# Patient Record
Sex: Male | Born: 1955 | Race: Black or African American | Hispanic: No | Marital: Married | Smoking: Heavy tobacco smoker
Health system: Southern US, Community
[De-identification: ages and names within clinical notes are randomized; demographics above are authoritative.]

## PROBLEM LIST (undated history)

## (undated) DIAGNOSIS — I81 Portal vein thrombosis: Secondary | ICD-10-CM

## (undated) DIAGNOSIS — K8689 Other specified diseases of pancreas: Secondary | ICD-10-CM

## (undated) HISTORY — PX: ABDOMINAL SURGERY: SHX537

## (undated) HISTORY — DX: Portal vein thrombosis: I81

## (undated) HISTORY — DX: Other specified diseases of pancreas: K86.89

---

## 2004-03-28 ENCOUNTER — Emergency Department: Payer: Self-pay | Admitting: General Practice

## 2004-12-18 ENCOUNTER — Ambulatory Visit: Payer: Self-pay

## 2005-12-15 ENCOUNTER — Ambulatory Visit: Payer: Self-pay | Admitting: General Practice

## 2007-10-05 ENCOUNTER — Emergency Department: Payer: Self-pay | Admitting: Emergency Medicine

## 2008-05-09 ENCOUNTER — Emergency Department: Payer: Self-pay | Admitting: Emergency Medicine

## 2008-09-13 ENCOUNTER — Emergency Department: Payer: Self-pay | Admitting: Emergency Medicine
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
## 2009-05-02 ENCOUNTER — Emergency Department: Payer: Self-pay | Admitting: Emergency Medicine
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
## 2010-09-16 ENCOUNTER — Emergency Department: Payer: Self-pay | Admitting: Emergency Medicine

## 2012-02-18 IMAGING — CR DG ANKLE COMPLETE 3+V*L*
1 series · 5 of 5 positions shown · non-contrast
Comparison: none

REASON FOR EXAM: pain
COMMENTS:   May transport without cardiac monitor

PROCEDURE:     DXR - DXR ANKLE LEFT COMPLETE  - September 16, 2010  [DATE]
RESULT:     Images of the left ankle demonstrate soft tissue swelling
especially laterally without a definite fracture in the tibia. No fibular
fracture is evident.

[Series 1: view not recorded · 0.17mm/px · 5 of 5 slices shown]
[im 1/5]
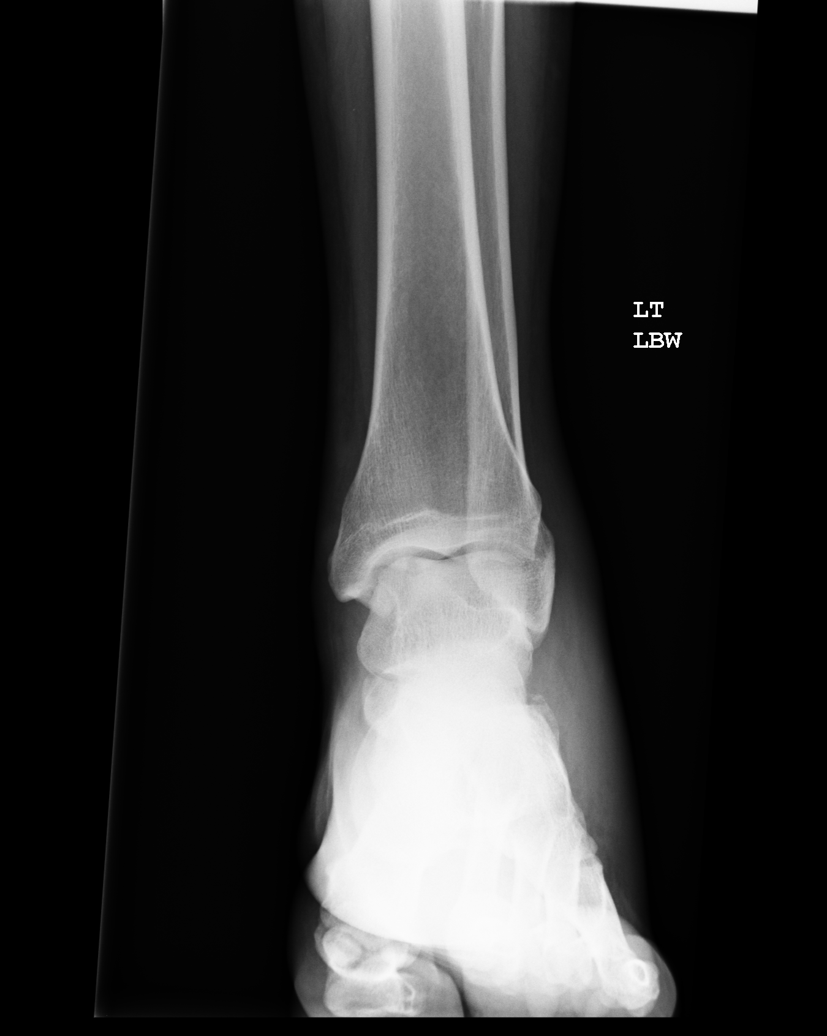
[im 2/5]
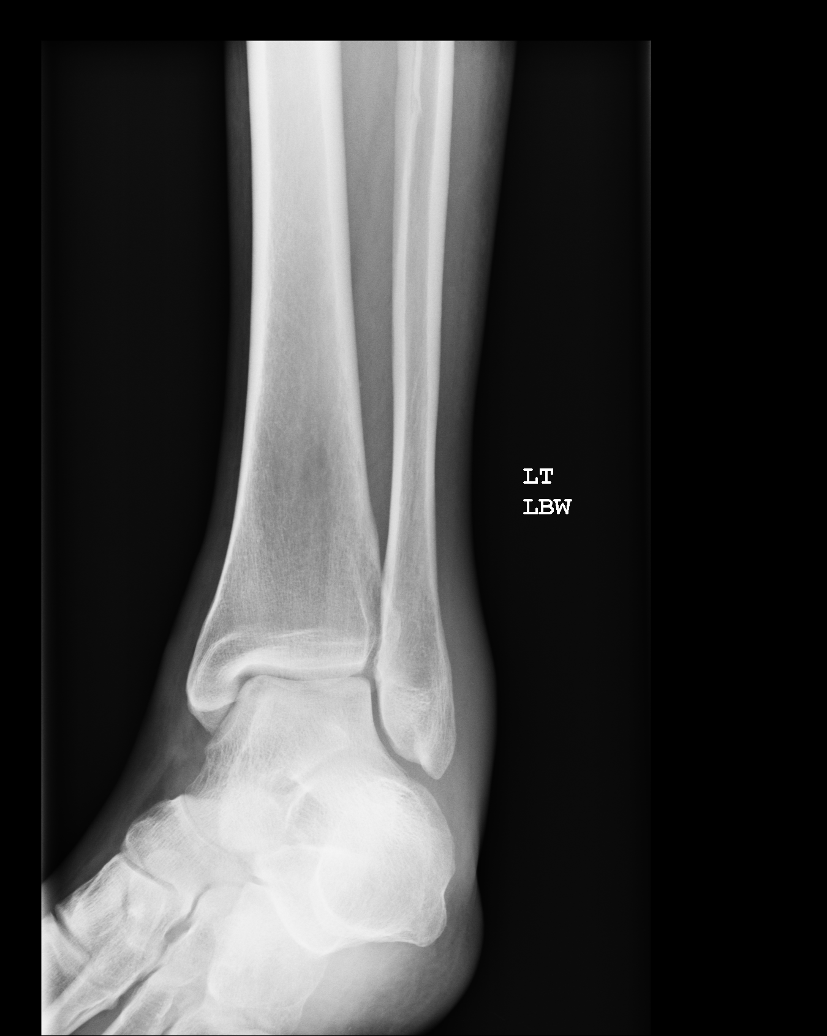
[im 3/5]
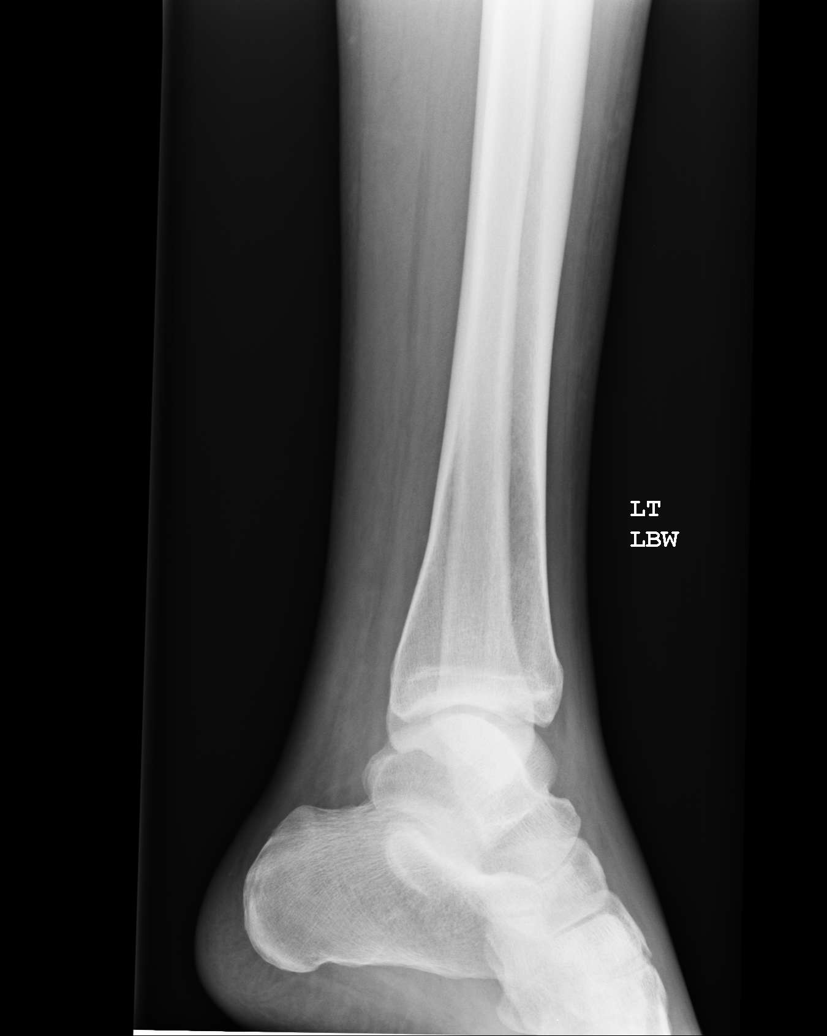
[im 4/5]
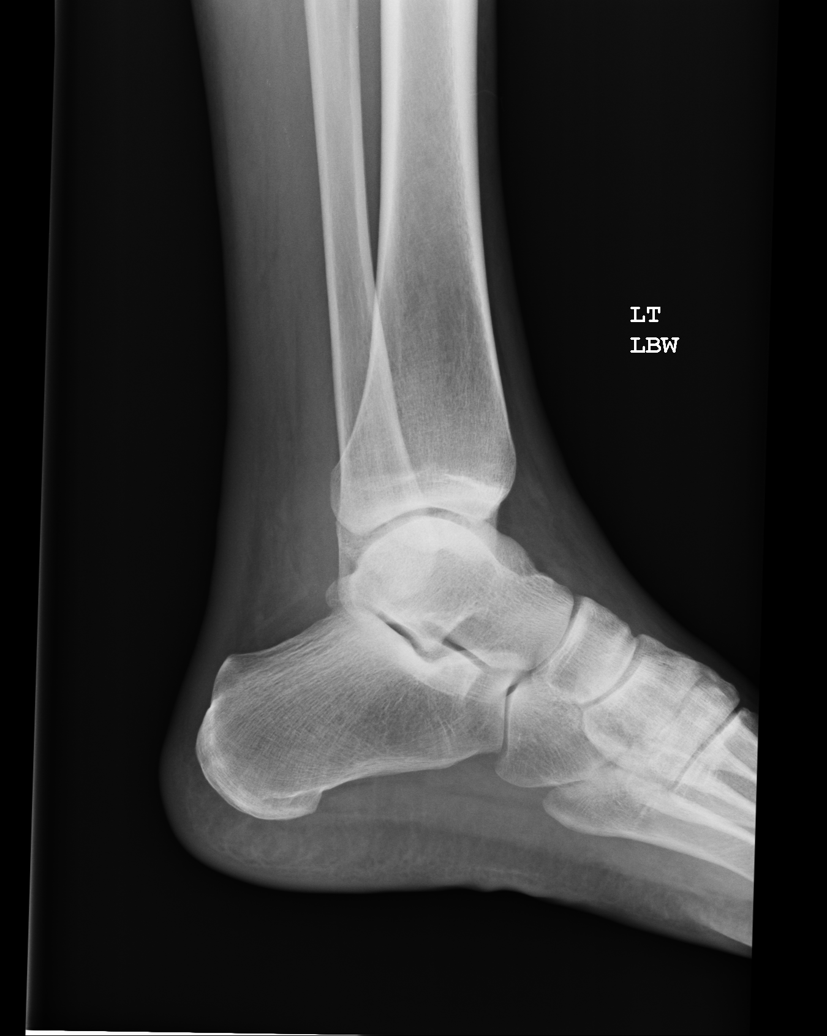
[im 5/5]
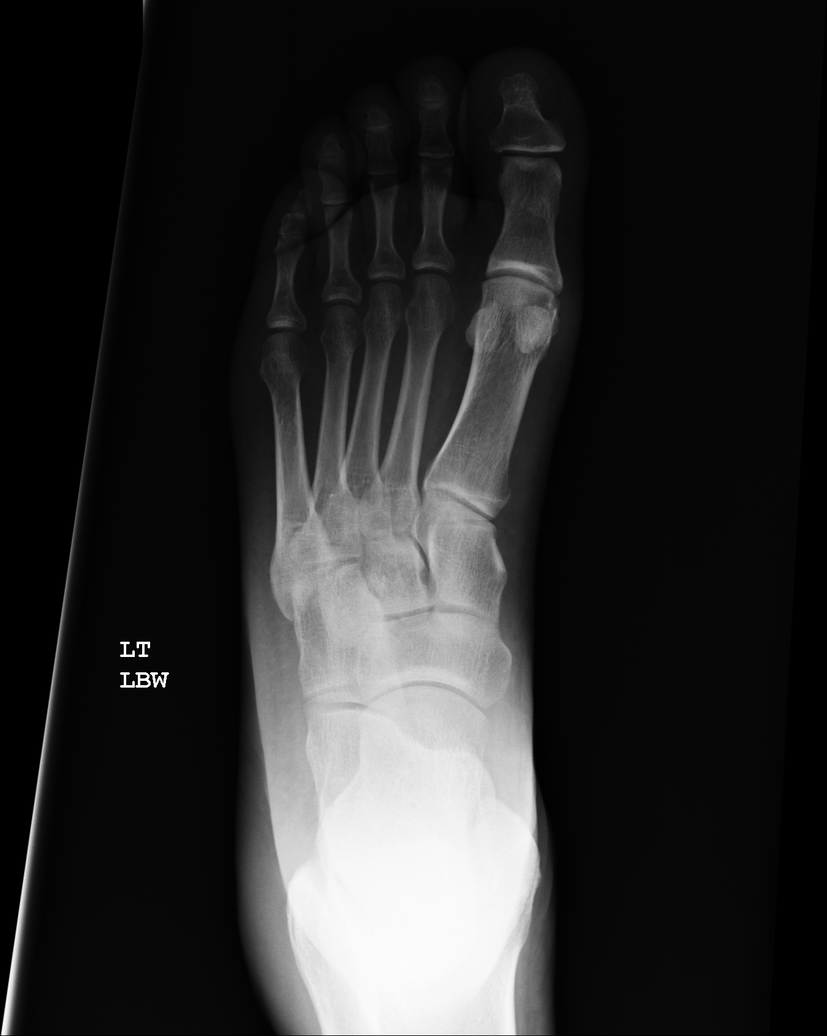

[5 of 5 positions shown; findings below may reference images not displayed]

IMPRESSION: 1. No acute bony abnormality evident. Soft tissue swelling demonstrated
laterally.

## 2014-10-01 ENCOUNTER — Emergency Department
Admission: EM | Admit: 2014-10-01 | Discharge: 2014-10-01 | Disposition: A | Discharge status: Home / Self Care | Payer: Self-pay | Attending: Emergency Medicine | Admitting: Emergency Medicine

## 2014-10-01 ENCOUNTER — Encounter: Payer: Self-pay | Admitting: Emergency Medicine

## 2014-10-01 DIAGNOSIS — T63441A Toxic effect of venom of bees, accidental (unintentional), initial encounter: Secondary | ICD-10-CM | POA: Insufficient documentation

## 2014-10-01 DIAGNOSIS — Y9389 Activity, other specified: Secondary | ICD-10-CM | POA: Insufficient documentation

## 2014-10-01 DIAGNOSIS — Y9289 Other specified places as the place of occurrence of the external cause: Secondary | ICD-10-CM | POA: Insufficient documentation

## 2014-10-01 DIAGNOSIS — Z720 Tobacco use: Secondary | ICD-10-CM | POA: Insufficient documentation

## 2014-10-01 DIAGNOSIS — Y998 Other external cause status: Secondary | ICD-10-CM | POA: Insufficient documentation

## 2014-10-01 MED ORDER — PREDNISONE 20 MG PO TABS
60.0000 mg | ORAL_TABLET | Freq: Once | ORAL | Status: AC
  Administered 2014-10-01: 60 mg via ORAL
  Filled 2014-10-01: qty 3

## 2014-10-01 MED ORDER — EPINEPHRINE HCL 1 MG/ML IJ SOLN
0.3000 mg | Freq: Once | INTRAMUSCULAR | Status: AC
  Administered 2014-10-01: 0.3 mg via INTRAMUSCULAR
  Filled 2014-10-01: qty 1

## 2014-10-01 MED ORDER — PREDNISONE 20 MG PO TABS: 40.0000 mg | ORAL_TABLET | Freq: Every day | ORAL | Status: DC

## 2014-10-01 MED ORDER — EPINEPHRINE 0.3 MG/0.3ML IJ SOAJ: 0.3000 mg | Freq: Once | INTRAMUSCULAR | Status: DC

## 2014-10-01 MED ORDER — DIPHENHYDRAMINE HCL 25 MG PO TABS: 25.0000 mg | ORAL_TABLET | Freq: Four times a day (QID) | ORAL | Status: DC | PRN

## 2014-10-01 NOTE — Discharge Instructions (Signed)
Anaphylactic Reaction °An anaphylactic reaction is a sudden, severe allergic reaction that involves the whole body. It can be life threatening. A hospital stay is often required. People with asthma, eczema, or hay fever are slightly more likely to have an anaphylactic reaction. °CAUSES  °An anaphylactic reaction may be caused by anything to which you are allergic. After being exposed to the allergic substance, your immune system becomes sensitized to it. When you are exposed to that allergic substance again, an allergic reaction can occur. Common causes of an anaphylactic reaction include: °· Medicines. °· Foods, especially peanuts, wheat, shellfish, milk, and eggs. °· Insect bites or stings. °· Blood products. °· Chemicals, such as dyes, latex, and contrast material used for imaging tests. °SYMPTOMS  °When an allergic reaction occurs, the body releases histamine and other substances. These substances cause symptoms such as tightening of the airway. Symptoms often develop within seconds or minutes of exposure. Symptoms may include: °· Skin rash or hives. °· Itching. °· Chest tightness. °· Swelling of the eyes, tongue, or lips. °· Trouble breathing or swallowing. °· Lightheadedness or fainting. °· Anxiety or confusion. °· Stomach pains, vomiting, or diarrhea. °· Nasal congestion. °· A fast or irregular heartbeat (palpitations). °DIAGNOSIS  °Diagnosis is based on your history of recent exposure to allergic substances, your symptoms, and a physical exam. Your caregiver may also perform blood or urine tests to confirm the diagnosis. °TREATMENT  °Epinephrine medicine is the main treatment for an anaphylactic reaction. Other medicines that may be used for treatment include antihistamines, steroids, and albuterol. In severe cases, fluids and medicine to support blood pressure may be given through an intravenous line (IV). Even if you improve after treatment, you need to be observed to make sure your condition does not get  worse. This may require a stay in the hospital. °HOME CARE INSTRUCTIONS  °· Wear a medical alert bracelet or necklace stating your allergy. °· You and your family must learn how to use an anaphylaxis kit or give an epinephrine injection to temporarily treat an emergency allergic reaction. Always carry your epinephrine injection or anaphylaxis kit with you. This can be lifesaving if you have a severe reaction. °· Do not drive or perform tasks after treatment until the medicines used to treat your reaction have worn off, or until your caregiver says it is okay. °· If you have hives or a rash: °· Take medicines as directed by your caregiver. °· You may use an over-the-counter antihistamine (diphenhydramine) as needed. °· Apply cold compresses to the skin or take baths in cool water. Avoid hot baths or showers. °SEEK MEDICAL CARE IF:  °· You develop symptoms of an allergic reaction to a new substance. Symptoms may start right away or minutes later. °· You develop a rash, hives, or itching. °· You develop new symptoms. °SEEK IMMEDIATE MEDICAL CARE IF:  °· You have swelling of the mouth, difficulty breathing, or wheezing. °· You have a tight feeling in your chest or throat. °· You develop hives, swelling, or itching all over your body. °· You develop severe vomiting or diarrhea. °· You feel faint or pass out. °This is an emergency. Use your epinephrine injection or anaphylaxis kit as you have been instructed. Call your local emergency services (911 in [place] Even if you improve after the injection, you need to be examined at a hospital emergency department. °MAKE SURE YOU:  °· Understand these instructions. °· Will watch your condition. °· Will get help right away if you are not   doing well or get worse. Document Released: 01/05/2005 Document Revised: 01/10/2013 Document Reviewed: 04/08/2011 [facility] Patient Information 2015 [place], [place]. This information is not intended to replace advice given to you by your health  care provider. Make sure you discuss any questions you have with your health care provider.  Bee, Wasp, or Hornet Sting Your caregiver has diagnosed you as having an insect sting. An insect sting appears as a red lump in the skin that sometimes has a tiny hole in the center, or it may have a stinger in the center of the wound. The most common stings are from wasps, hornets and bees. Individuals have different reactions to insect stings.  A normal reaction may cause pain, swelling, and redness around the sting site.  A localized allergic reaction may cause swelling and redness that extends beyond the sting site.  A large local reaction may continue to develop over the next 12 to 36 hours.  On occasion, the reactions can be severe (anaphylactic reaction). An anaphylactic reaction may cause wheezing; difficulty breathing; chest pain; fainting; raised, itchy, red patches on the skin; a sick feeling to your stomach (nausea); vomiting; cramping; or diarrhea. If you have had an anaphylactic reaction to an insect sting in the past, you are more likely to have one again. HOME CARE INSTRUCTIONS   With bee stings, a small sac of poison is left in the wound. Brushing across this with something such as a credit card, or anything similar, will help remove this and decrease the amount of the reaction. This same procedure will not help a wasp sting as they do not leave behind a stinger and poison sac.  Apply a cold compress for 10 to 20 minutes every hour for 1 to 2 days, depending on severity, to reduce swelling and itching.  To lessen pain, a paste made of water and baking soda may be rubbed on the bite or sting and left on for 5 minutes.  To relieve itching and swelling, you may use take medication or apply medicated creams or lotions as directed.  Only take over-the-counter or prescription medicines for pain, discomfort, or fever as directed by your caregiver.  Wash the sting site daily with soap and  water. Apply antibiotic ointment on the sting site as directed.  If you suffered a severe reaction:  If you did not require hospitalization, an adult will need to stay with you for 24 hours in case the symptoms return.  You may need to wear a medical bracelet or necklace stating the allergy.  You and your family need to learn when and how to use an anaphylaxis kit or epinephrine injection.  If you have had a severe reaction before, always carry your anaphylaxis kit with you. SEEK MEDICAL CARE IF:   None of the above helps within 2 to 3 days.  The area becomes red, warm, tender, and swollen beyond the area of the bite or sting.  You have an oral temperature above 102 F (38.9 C). SEEK IMMEDIATE MEDICAL CARE IF:  You have symptoms of an allergic reaction which are:  Wheezing.  Difficulty breathing.  Chest pain.  Lightheadedness or fainting.  Itchy, raised, red patches on the skin.  Nausea, vomiting, cramping or diarrhea. ANY OF THESE SYMPTOMS MAY REPRESENT A SERIOUS PROBLEM THAT IS AN EMERGENCY. Do not wait to see if the symptoms will go away. Get medical help right away. Call your local emergency services (911 in [place] DO NOT drive yourself to the hospital. MAKE  SURE YOU:   Understand these instructions.  Will watch your condition.  Will get help right away if you are not doing well or get worse. Document Released: 01/05/2005 Document Revised: 03/30/2011 Document Reviewed: 06/22/2009 [facility] Patient Information 2015 [place], [place]. This information is not intended to replace advice given to you by your health care provider. Make sure you discuss any questions you have with your health care provider.

## 2014-10-01 NOTE — ED Notes (Signed)
Pt arrived via EMS from mowing grass. Per patient, was stung by yellow jackets in the head. Hx of no allergies. 75 mg IV benadryl, 50 mg IV Zantac, and 4 mg IV Zofran given bty EMS. Pt is alert and oriented on arrival. Pt with clear speech and denies difficulty swallowing.

## 2014-10-01 NOTE — ED Provider Notes (Signed)
[facility] Emergency Department Provider Note  ____________________________________________  Time seen: 11:45 AM on arrival by EMS  I have reviewed the triage vital signs and the nursing notes.   HISTORY  Chief Complaint Insect Bite    HPI Wyatt Montgomery is a 59 y.o. male who was mowing grass around 11:00 AM he was stung by multiple yellow jackets. He denies any medical history surgical history medications or allergies. No known allergy to yellow jackets or other hymenoptera. Afterward he started having hives all over his body and he called EMS. They report that on arrival he had hives diffusely all over his body. They gave him IV Zofran and Benadryl and Zantac and symptoms improved prior to arrival. The patient did vomit once. He currently denies any chest pain shortness of breath throat swelling or any other symptoms.     History reviewed. No pertinent past medical history.   There are no active problems to display for this patient.    History reviewed. No pertinent past surgical history.   Current Outpatient Rx  Name  Route  Sig  Dispense  Refill  . diphenhydrAMINE (BENADRYL) 25 MG tablet   Oral   Take 1 tablet (25 mg total) by mouth every 6 (six) hours as needed.   30 tablet   0   . EPINEPHrine 0.3 mg/0.3 mL IJ SOAJ injection   Intramuscular   Inject 0.3 mLs (0.3 mg total) into the muscle once. Follow package instructions as needed for severe allergy or anaphylactic reaction.   1 Device   2   . predniSONE (DELTASONE) 20 MG tablet   Oral   Take 2 tablets (40 mg total) by mouth daily.   8 tablet   0      Allergies Review of patient's allergies indicates no known allergies.   History reviewed. No pertinent family history.  Social History Social History  Substance Use Topics  . Smoking status: Current Every Day Smoker -- 1.00 packs/day for 20 years    Types: Cigarettes  . Smokeless tobacco: None  . Alcohol Use: Yes      Comment: drinks 40 oz of beer a day    Review of Systems  Constitutional:   No fever or chills. No weight changes Eyes:   No blurry vision or double vision.  ENT:   No sore throat. Cardiovascular:   No chest pain. Respiratory:   No dyspnea or cough. Gastrointestinal:   Negative for abdominal pain, vomiting and diarrhea.  No BRBPR or melena. Genitourinary:   Negative for dysuria, urinary retention, bloody urine, or difficulty urinating. Musculoskeletal:   Negative for back pain. No joint swelling or pain. Skin:   Negative for rash. Neurological:   Negative for headaches, focal weakness or numbness. Psychiatric:  No anxiety or depression.   Endocrine:  No hot/cold intolerance, changes in energy, or sleep difficulty.  10-point ROS otherwise negative.  ____________________________________________   PHYSICAL EXAM:  VITAL SIGNS: ED Triage Vitals  Enc Vitals Group     BP 10/01/14 1201 138/82 mmHg     Pulse Rate 10/01/14 1201 62     Resp 10/01/14 1201 16     Temp 10/01/14 1201 97.3 F (36.3 C)     Temp Source 10/01/14 1201 Oral     SpO2 10/01/14 1201 97 %     Weight 10/01/14 1201 180 lb (81.647 kg)     Height 10/01/14 1201 5\' 10"  (1.778 m)     Head Cir --  Peak Flow --      Pain Score --      Pain Loc --      Pain Edu? --      Excl. in GC? --      Constitutional:   Alert and oriented. Well appearing and in no distress. Eyes:   No scleral icterus. No conjunctival pallor. PERRL. EOMI ENT   Head:   Normocephalic and atraumatic.   Nose:   No congestion/rhinnorhea. No septal hematoma   Mouth/Throat:   MMM, no pharyngeal erythema. No peritonsillar mass. No uvula shift.   Neck:   No stridor. No SubQ emphysema. No meningismus. Hematological/Lymphatic/Immunilogical:   No cervical lymphadenopathy. Cardiovascular:   RRR. Normal and symmetric distal pulses are present in all extremities. No murmurs, rubs, or gallops. Respiratory:   Normal respiratory effort  without tachypnea nor retractions. Breath sounds are clear and equal bilaterally. Coarse breath sounds diffusely. No frank wheezing  Gastrointestinal:   Soft and nontender. No distention. There is no CVA tenderness.  No rebound, rigidity, or guarding. Genitourinary:   deferred Musculoskeletal:   Nontender with normal range of motion in all extremities. No joint effusions.  No lower extremity tenderness.  No edema. Neurologic:   Normal speech and language.  CN 2-10 normal. Motor grossly intact. No pronator drift.  Normal gait. No gross focal neurologic deficits are appreciated.  Skin:    Skin is warm, dry and intact. No rash noted.  No petechiae, purpura, or bullae. Psychiatric:   Mood and affect are normal. Speech and behavior are normal. Patient exhibits appropriate insight and judgment.  ____________________________________________    LABS (pertinent positives/negatives) (all labs ordered are listed, but only abnormal results are displayed) Labs Reviewed - No data to display ____________________________________________   EKG    ____________________________________________    RADIOLOGY    ____________________________________________   PROCEDURES CRITICAL CARE Performed by: Scotty Court, Hitoshi Werts   Total critical care time: 35 minutes  Critical care time was exclusive of separately billable procedures and treating other patients.  Critical care was necessary to treat or prevent imminent or life-threatening deterioration.  Critical care was time spent personally by me on the following activities: development of treatment plan with patient and/or surrogate as well as nursing, discussions with consultants, evaluation of patient's response to treatment, examination of patient, obtaining history from patient or surrogate, ordering and performing treatments and interventions, ordering and review of laboratory studies, ordering and review of radiographic studies, pulse oximetry and  re-evaluation of patient's condition.   ____________________________________________   INITIAL IMPRESSION / ASSESSMENT AND PLAN / ED COURSE  Pertinent labs & imaging results that were available during my care of the patient were reviewed by me and considered in my medical decision making (see chart for details).  Patient presents with anaphylactic reaction after multiple yellow jacket stings. On reviewing EMS vital sign flow sheet, the patient did have a transient low oxygen saturation to 90% and low blood pressure of 90/50 on initial assessment, but with antihistamines and time his vital signs normalized quickly. With the coarse air movement in the lungs and episode of vomiting and skin findings by EMS which have since resolved, it appears the patient has had an anaphylactic reaction. We'll give him prednisone and epinephrine intramuscularly and monitor him closely.  ----------------------------------------- 1:14 PM on 10/01/2014 -----------------------------------------  Patient remains asymptomatic in the ED. On multiple reassessments he is awake alert normal mental status, nonlabored breathing. No worsening or recurrence of symptoms. Vital signs remained normal. We will discharge  him home and have him follow up with primary care. Prescription for EpiPen given.     ____________________________________________   FINAL CLINICAL IMPRESSION(S) / ED DIAGNOSES  Final diagnoses:  Anaphylactic reaction to bee sting, accidental or unintentional, initial encounter      Sharman Cheek, MD 10/01/14 1314

## 2017-10-30 ENCOUNTER — Encounter: Payer: Self-pay | Admitting: Emergency Medicine

## 2017-10-30 ENCOUNTER — Emergency Department
Admission: EM | Admit: 2017-10-30 | Discharge: 2017-10-30 | Disposition: A | Discharge status: Home / Self Care | Payer: Self-pay | Attending: Emergency Medicine | Admitting: Emergency Medicine

## 2017-10-30 ENCOUNTER — Emergency Department: Payer: Self-pay

## 2017-10-30 ENCOUNTER — Other Ambulatory Visit: Payer: Self-pay

## 2017-10-30 DIAGNOSIS — Z79899 Other long term (current) drug therapy: Secondary | ICD-10-CM | POA: Insufficient documentation

## 2017-10-30 DIAGNOSIS — S01511A Laceration without foreign body of lip, initial encounter: Secondary | ICD-10-CM | POA: Insufficient documentation

## 2017-10-30 DIAGNOSIS — S0083XA Contusion of other part of head, initial encounter: Secondary | ICD-10-CM | POA: Insufficient documentation

## 2017-10-30 DIAGNOSIS — Y999 Unspecified external cause status: Secondary | ICD-10-CM | POA: Insufficient documentation

## 2017-10-30 DIAGNOSIS — F17210 Nicotine dependence, cigarettes, uncomplicated: Secondary | ICD-10-CM | POA: Insufficient documentation

## 2017-10-30 DIAGNOSIS — Y929 Unspecified place or not applicable: Secondary | ICD-10-CM | POA: Insufficient documentation

## 2017-10-30 DIAGNOSIS — S0990XA Unspecified injury of head, initial encounter: Secondary | ICD-10-CM | POA: Insufficient documentation

## 2017-10-30 DIAGNOSIS — Y9389 Activity, other specified: Secondary | ICD-10-CM | POA: Insufficient documentation

## 2017-10-30 DIAGNOSIS — T148XXA Other injury of unspecified body region, initial encounter: Secondary | ICD-10-CM | POA: Insufficient documentation

## 2017-10-30 LAB — COMPREHENSIVE METABOLIC PANEL
ALBUMIN: 3.7 g/dL (ref 3.5–5.0)
ALK PHOS: 85 U/L (ref 38–126)
ALT: 15 U/L (ref 0–44)
ANION GAP: 11 (ref 5–15)
AST: 35 U/L (ref 15–41)
BILIRUBIN TOTAL: 0.4 mg/dL (ref 0.3–1.2)
BUN: 10 mg/dL (ref 6–20)
CALCIUM: 8.4 mg/dL — AB (ref 8.9–10.3)
CO2: 21 mmol/L — ABNORMAL LOW (ref 22–32)
Chloride: 97 mmol/L — ABNORMAL LOW (ref 98–111)
Creatinine, Ser: 1.03 mg/dL (ref 0.61–1.24)
GLUCOSE: 86 mg/dL (ref 70–99)
Potassium: 3.7 mmol/L (ref 3.5–5.1)
Sodium: 129 mmol/L — ABNORMAL LOW (ref 135–145)
TOTAL PROTEIN: 6.7 g/dL (ref 6.5–8.1)

## 2017-10-30 LAB — CBC
HEMATOCRIT: 33.1 % — AB (ref 39.0–52.0)
HEMOGLOBIN: 10.9 g/dL — AB (ref 13.0–17.0)
MCH: 27.7 pg (ref 26.0–34.0)
MCHC: 32.9 g/dL (ref 30.0–36.0)
MCV: 84 fL (ref 80.0–100.0)
Platelets: 231 10*3/uL (ref 150–400)
RBC: 3.94 MIL/uL — ABNORMAL LOW (ref 4.22–5.81)
RDW: 12.9 % (ref 11.5–15.5)
WBC: 4.3 10*3/uL (ref 4.0–10.5)
nRBC: 0 % (ref 0.0–0.2)

## 2017-10-30 LAB — ETHANOL: Alcohol, Ethyl (B): 259 mg/dL — ABNORMAL HIGH (ref <10)
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
## 2017-10-30 NOTE — ED Notes (Signed)
Pt brother sending taxi to take pt home. Brother works for [employer] and states they will help him get in is house.
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
## 2019-05-29 ENCOUNTER — Inpatient Hospital Stay: Payer: Self-pay

## 2019-05-29 ENCOUNTER — Encounter: Payer: Self-pay | Admitting: Emergency Medicine

## 2019-05-29 ENCOUNTER — Emergency Department: Payer: Self-pay

## 2019-05-29 ENCOUNTER — Inpatient Hospital Stay
Admission: EM | Admit: 2019-05-29 | Discharge: 2019-05-31 | DRG: 441 | Disposition: A | Discharge status: Home / Self Care | Payer: Self-pay | Attending: Internal Medicine | Admitting: Internal Medicine

## 2019-05-29 ENCOUNTER — Other Ambulatory Visit: Payer: Self-pay

## 2019-05-29 DIAGNOSIS — Z7952 Long term (current) use of systemic steroids: Secondary | ICD-10-CM

## 2019-05-29 DIAGNOSIS — E871 Hypo-osmolality and hyponatremia: Secondary | ICD-10-CM

## 2019-05-29 DIAGNOSIS — Z825 Family history of asthma and other chronic lower respiratory diseases: Secondary | ICD-10-CM

## 2019-05-29 DIAGNOSIS — Z20822 Contact with and (suspected) exposure to covid-19: Secondary | ICD-10-CM | POA: Diagnosis present

## 2019-05-29 DIAGNOSIS — I81 Portal vein thrombosis: Principal | ICD-10-CM

## 2019-05-29 DIAGNOSIS — Z79899 Other long term (current) drug therapy: Secondary | ICD-10-CM

## 2019-05-29 DIAGNOSIS — R112 Nausea with vomiting, unspecified: Secondary | ICD-10-CM

## 2019-05-29 DIAGNOSIS — K8689 Other specified diseases of pancreas: Secondary | ICD-10-CM

## 2019-05-29 DIAGNOSIS — F17210 Nicotine dependence, cigarettes, uncomplicated: Secondary | ICD-10-CM | POA: Diagnosis present

## 2019-05-29 DIAGNOSIS — Z8249 Family history of ischemic heart disease and other diseases of the circulatory system: Secondary | ICD-10-CM

## 2019-05-29 DIAGNOSIS — K860 Alcohol-induced chronic pancreatitis: Secondary | ICD-10-CM | POA: Insufficient documentation

## 2019-05-29 DIAGNOSIS — K55069 Acute infarction of intestine, part and extent unspecified: Secondary | ICD-10-CM

## 2019-05-29 LAB — URINALYSIS, COMPLETE (UACMP) WITH MICROSCOPIC
Bacteria, UA: NONE SEEN
Bilirubin Urine: NEGATIVE
Glucose, UA: NEGATIVE mg/dL
Hgb urine dipstick: NEGATIVE
Ketones, ur: NEGATIVE mg/dL
Leukocytes,Ua: NEGATIVE
Nitrite: NEGATIVE
Protein, ur: NEGATIVE mg/dL
Specific Gravity, Urine: >1.046 — ABNORMAL HIGH (ref 1.005–1.030)
pH: 7 (ref 5.0–8.0)

## 2019-05-29 LAB — PROTIME-INR
INR: 1.2 (ref 0.8–1.2)
Prothrombin Time: 14.5 seconds (ref 11.4–15.2)

## 2019-05-29 LAB — COMPREHENSIVE METABOLIC PANEL
ALT: 15 U/L (ref 0–44)
AST: 43 U/L — ABNORMAL HIGH (ref 15–41)
Albumin: 3.5 g/dL (ref 3.5–5.0)
Alkaline Phosphatase: 98 U/L (ref 38–126)
Anion gap: 9 (ref 5–15)
BUN: 9 mg/dL (ref 8–23)
CO2: 25 mmol/L (ref 22–32)
Calcium: 9 mg/dL (ref 8.9–10.3)
Chloride: 94 mmol/L — ABNORMAL LOW (ref 98–111)
Creatinine, Ser: 1.37 mg/dL — ABNORMAL HIGH (ref 0.61–1.24)
GFR calc Af Amer: >60 mL/min (ref >60)
GFR calc non Af Amer: 55 mL/min — ABNORMAL LOW (ref >60)
Glucose, Bld: 194 mg/dL — ABNORMAL HIGH (ref 70–99)
Potassium: 3.5 mmol/L (ref 3.5–5.1)
Sodium: 128 mmol/L — ABNORMAL LOW (ref 135–145)
Total Bilirubin: 0.5 mg/dL (ref 0.3–1.2)
Total Protein: 7.1 g/dL (ref 6.5–8.1)

## 2019-05-29 LAB — APTT: aPTT: >160 seconds (ref 24–36)

## 2019-05-29 LAB — CBC
HCT: 33.2 % — ABNORMAL LOW (ref 39.0–52.0)
Hemoglobin: 11.4 g/dL — ABNORMAL LOW (ref 13.0–17.0)
MCH: 28.6 pg (ref 26.0–34.0)
MCHC: 34.3 g/dL (ref 30.0–36.0)
MCV: 83.2 fL (ref 80.0–100.0)
Platelets: 218 10*3/uL (ref 150–400)
RBC: 3.99 MIL/uL — ABNORMAL LOW (ref 4.22–5.81)
RDW: 14.3 % (ref 11.5–15.5)
WBC: 5 10*3/uL (ref 4.0–10.5)
nRBC: 0 % (ref 0.0–0.2)

## 2019-05-29 LAB — HEMOGLOBIN A1C
Hgb A1c MFr Bld: 5.6 % (ref 4.8–5.6)
Mean Plasma Glucose: 114.02 mg/dL

## 2019-05-29 LAB — LIPASE, BLOOD: Lipase: 35 U/L (ref 11–51)

## 2019-05-29 LAB — SARS CORONAVIRUS 2 BY RT PCR (HOSPITAL ORDER, PERFORMED IN ~~LOC~~ HOSPITAL LAB): SARS Coronavirus 2: NEGATIVE

## 2019-05-29 MED ORDER — ONDANSETRON HCL 4 MG/2ML IJ SOLN: 4.0000 mg | Freq: Four times a day (QID) | INTRAMUSCULAR | Status: DC | PRN

## 2019-05-29 MED ORDER — SODIUM CHLORIDE 0.9 % IV BOLUS
1000.0000 mL | Freq: Once | INTRAVENOUS | Status: AC
  Administered 2019-05-29: 1000 mL via INTRAVENOUS

## 2019-05-29 MED ORDER — OXYCODONE HCL 5 MG PO TABS
5.0000 mg | ORAL_TABLET | ORAL | Status: DC | PRN
  Administered 2019-05-29 – 2019-05-30 (×2): 5 mg via ORAL
  Filled 2019-05-29 (×2): qty 1

## 2019-05-29 MED ORDER — ONDANSETRON HCL 4 MG/2ML IJ SOLN
4.0000 mg | Freq: Once | INTRAMUSCULAR | Status: AC
  Administered 2019-05-29: 4 mg via INTRAVENOUS
  Filled 2019-05-29: qty 2

## 2019-05-29 MED ORDER — MORPHINE SULFATE (PF) 2 MG/ML IV SOLN
1.0000 mg | INTRAVENOUS | Status: DC | PRN
  Administered 2019-05-29 – 2019-05-30 (×2): 1 mg via INTRAVENOUS
  Filled 2019-05-29 (×2): qty 1

## 2019-05-29 MED ORDER — SODIUM CHLORIDE 0.9% FLUSH: 3.0000 mL | Freq: Once | INTRAVENOUS | Status: DC

## 2019-05-29 MED ORDER — ACETAMINOPHEN 325 MG PO TABS
650.0000 mg | ORAL_TABLET | Freq: Four times a day (QID) | ORAL | Status: DC | PRN
  Administered 2019-05-30: 650 mg via ORAL
  Filled 2019-05-29: qty 2

## 2019-05-29 MED ORDER — IOHEXOL 300 MG/ML  SOLN
100.0000 mL | Freq: Once | INTRAMUSCULAR | Status: AC | PRN
  Administered 2019-05-29: 100 mL via INTRAVENOUS

## 2019-05-29 MED ORDER — HEPARIN (PORCINE) 25000 UT/250ML-% IV SOLN
1150.0000 [IU]/h | INTRAVENOUS | Status: DC
  Administered 2019-05-29 – 2019-05-30 (×2): 1250 [IU]/h via INTRAVENOUS
  Filled 2019-05-29 (×2): qty 250

## 2019-05-29 MED ORDER — HEPARIN BOLUS VIA INFUSION
4500.0000 [IU] | Freq: Once | INTRAVENOUS | Status: AC
  Administered 2019-05-29: 4500 [IU] via INTRAVENOUS
  Filled 2019-05-29: qty 4500

## 2019-05-29 MED ORDER — SODIUM CHLORIDE 0.9 % IV SOLN: Freq: Once | INTRAVENOUS | Status: AC

## 2019-05-29 MED ORDER — SENNA 8.6 MG PO TABS
1.0000 | ORAL_TABLET | Freq: Two times a day (BID) | ORAL | Status: DC
  Administered 2019-05-29 – 2019-05-31 (×3): 8.6 mg via ORAL
  Filled 2019-05-29 (×3): qty 1

## 2019-05-29 MED ORDER — ACETAMINOPHEN 650 MG RE SUPP: 650.0000 mg | Freq: Four times a day (QID) | RECTAL | Status: DC | PRN

## 2019-05-29 MED ORDER — ONDANSETRON HCL 4 MG PO TABS: 4.0000 mg | ORAL_TABLET | Freq: Four times a day (QID) | ORAL | Status: DC | PRN

## 2019-05-29 MED ORDER — LORAZEPAM 2 MG/ML IJ SOLN
0.5000 mg | Freq: Four times a day (QID) | INTRAMUSCULAR | Status: DC | PRN
  Administered 2019-05-29 – 2019-05-30 (×3): 0.5 mg via INTRAVENOUS
  Filled 2019-05-29 (×4): qty 1

## 2019-05-29 MED ORDER — HYDROMORPHONE HCL 1 MG/ML IJ SOLN
0.5000 mg | Freq: Once | INTRAMUSCULAR | Status: AC
  Administered 2019-05-29: 0.5 mg via INTRAVENOUS
  Filled 2019-05-29: qty 1

## 2019-05-29 MED ORDER — GADOBUTROL 1 MMOL/ML IV SOLN
7.0000 mL | Freq: Once | INTRAVENOUS | Status: AC | PRN
  Administered 2019-05-29: 7 mL via INTRAVENOUS

## 2019-05-29 NOTE — ED Notes (Signed)
Assigned bed @ [id number], spoke with RN Victorino Dike.

## 2019-05-29 NOTE — H&P (Signed)
[facility] - [place] [facility]   PATIENT NAME: Wyatt Montgomery    MR#:  [id number]  DATE OF BIRTH:  [birth year]  DATE OF ADMISSION:  05/29/2019  PRIMARY CARE PHYSICIAN: Patient, No Pcp Per   REQUESTING/REFERRING PHYSICIAN: Dr Fuller Plan  Patient coming from : Home No family in ER CHIEF COMPLAINT:   Abdominal pain diffuse intermittent vomiting for 2 to 3 weeks. Poor appetite HISTORY OF PRESENT ILLNESS:  Wyatt Montgomery  is a 64 y.o. 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 Denies any weight loss. Denies any fever. No diarrhea chest pain shortness of breath or cough. Denies any issues with urinating  ED course: 10 was noted to have temperature of 99.2, initially was tachycardic however during my evaluation heart rate was 81. Blood pressure 163/87. Workup in the ER showed sodium of 128. Creatinine of 1.3.   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 Chronic pancreatitis is noted with severe ductal dilatation throughout the pancreas. Possible neoplasm or malignancy in the pancreatic head cannot be excluded. Acute inflammation may be present as well. MRI is recommended for further evaluation. 3. Moderate right renal atrophy. PAST MEDICAL HISTORY:  History reviewed. No pertinent past medical history.  PAST SURGICAL HISTOIRY:   Past Surgical History:  Procedure Laterality Date  . ABDOMINAL SURGERY      SOCIAL HISTORY:   Social History   Tobacco Use  . Smoking status: Current Every Day Smoker    Packs/day: 1.00    Years: 20.00    Pack years: 20.00    Types: Cigarettes  . Smokeless tobacco: Never Used  Substance Use Topics  . Alcohol use: Yes    Comment: drinks 40 oz of beer a day--last beer yesterday    FAMILY  HISTORY:  No family history on file.  DRUG ALLERGIES:  No Known Allergies  REVIEW OF SYSTEMS:  Review of Systems  Constitutional: Negative for chills, fever and weight loss.  HENT: Negative for ear discharge, ear pain and nosebleeds.   Eyes: Negative for blurred vision, pain and discharge.  Respiratory: Negative for sputum production, shortness of breath, wheezing and stridor.   Cardiovascular: Negative for chest pain, palpitations, orthopnea and PND.  Gastrointestinal: Positive for abdominal pain and nausea. Negative for diarrhea and vomiting.  Genitourinary: Negative for frequency and urgency.  Musculoskeletal: Negative for back pain and joint pain.  Neurological: Negative for sensory change, speech change, focal weakness and weakness.  Psychiatric/Behavioral: Negative for depression and hallucinations. The patient is not nervous/anxious.      MEDICATIONS AT HOME:   Prior to Admission medications   Medication Sig Start Date End Date Taking? Authorizing Provider  diphenhydrAMINE (BENADRYL) 25 MG tablet Take 1 tablet (25 mg total) by mouth every 6 (six) hours as needed. 10/01/14   Sharman Cheek, MD  EPINEPHrine 0.3 mg/0.3 mL IJ SOAJ injection Inject 0.3 mLs (0.3 mg total) into the muscle once. Follow package instructions as needed for severe allergy or anaphylactic reaction. 10/01/14   Sharman Cheek, MD      VITAL SIGNS:  Blood pressure (!) 163/87, pulse 81, temperature 99.2 F (37.3 C), temperature source Oral, resp. rate 18, height 5\' 10"  (1.778 m), weight 70.3 kg, SpO2 100 %.  PHYSICAL EXAMINATION:  GENERAL:  64  y.o.-year-old patient lying in the bed with no acute distress. thin EYES: Pupils equal, round, reactive to light and accommodation. No scleral icterus.  HEENT: Head atraumatic, normocephalic. Oropharynx and nasopharynx clear.  NECK:  Supple, no jugular venous distention. No thyroid enlargement, no tenderness.  LUNGS: Normal breath sounds bilaterally, no  wheezing, rales,rhonchi or crepitation. No use of accessory muscles of respiration.  CARDIOVASCULAR: S1, S2 normal. No murmurs, rubs, or gallops.  ABDOMEN: Soft, diffuse mild tender, nondistended. Bowel sounds present. No organomegaly or mass.  EXTREMITIES: No pedal edema, cyanosis, or clubbing.  NEUROLOGIC: Cranial nerves II through XII are intact. Muscle strength 5/5 in all extremities. Sensation intact. Gait not checked.  PSYCHIATRIC:  patient is alert and oriented x 3.  SKIN: No obvious rash, lesion, or ulcer.   LABORATORY PANEL:   CBC Recent Labs  Lab 05/29/19 0942  WBC 5.0  HGB 11.4*  HCT 33.2*  PLT 218   ------------------------------------------------------------------------------------------------------------------  Chemistries  Recent Labs  Lab 05/29/19 0942  NA 128*  K 3.5  CL 94*  CO2 25  GLUCOSE 194*  BUN 9  CREATININE 1.37*  CALCIUM 9.0  AST 43*  ALT 15  ALKPHOS 98  BILITOT 0.5   ------------------------------------------------------------------------------------------------------------------  Cardiac Enzymes No results for input(s): TROPONINI in the last 168 hours. ------------------------------------------------------------------------------------------------------------------  RADIOLOGY:  DG Chest 2 View  Result Date: 05/29/2019 CLINICAL DATA:  Fever EXAM: CHEST - 2 VIEW COMPARISON:  05/09/2008 FINDINGS: The lungs are hyperinflated likely secondary to COPD. There is no focal consolidation. There is no pleural effusion or pneumothorax. The heart and mediastinal contours are unremarkable. There is no acute osseous abnormality. IMPRESSION: No active cardiopulmonary disease. Electronically Signed   By: Kathreen Devoid   On: 05/29/2019 13:55   CT ABDOMEN PELVIS W CONTRAST  Result Date: 05/29/2019 CLINICAL DATA:  Abdominal distension. EXAM: CT ABDOMEN AND PELVIS WITH CONTRAST TECHNIQUE: Multidetector CT imaging of the abdomen and pelvis was performed using  the standard protocol following bolus administration of intravenous contrast. CONTRAST:  161mL OMNIPAQUE IOHEXOL 300 MG/ML  SOLN COMPARISON:  None. FINDINGS: Lower chest: No acute abnormality. Hepatobiliary: Status post cholecystectomy. Left and right hepatic pneumobilia is noted. There is noted which appears to be acute thrombosis of the portal vein and a large portion of the superior mesenteric vein. Pancreas: Calcifications are noted in the pancreatic head consistent with chronic pancreatitis. Severe ductal dilatation is noted throughout the pancreas. Possible neoplasm or malignancy in the pancreatic head cannot be excluded. Acute inflammation may be present as well. Spleen: Normal in size without focal abnormality. Adrenals/Urinary Tract: Adrenal glands appear normal. Left kidney and ureter are unremarkable. Urinary bladder is unremarkable. Moderate right renal atrophy is noted. Stomach/Bowel: Stomach is within normal limits. Appendix appears normal. No evidence of bowel wall thickening, distention, or inflammatory changes. Vascular/Lymphatic: Aortic atherosclerosis. No enlarged abdominal or pelvic lymph nodes. Reproductive: Prostate is unremarkable. Other: No abdominal wall hernia or abnormality. No abdominopelvic ascites. Musculoskeletal: No acute or significant osseous findings. IMPRESSION: 1. 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 2. Chronic pancreatitis is noted with severe ductal dilatation throughout the pancreas. Possible neoplasm or malignancy in the pancreatic head cannot be excluded. Acute inflammation may be present as well. MRI is recommended for further evaluation. 3. Moderate right renal atrophy. Aortic Atherosclerosis (ICD10-I70.0). Electronically Signed   By: Marijo Conception M.D.   On: 05/29/2019 13:38    EKG:    IMPRESSION AND PLAN:  Wyatt Montgomery  is a  64 y.o. 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 Denies any weight loss  1. 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 Gilda Crease aware. Recommends IV heparin drip -further recommendations after MRI abdomen -IV fluid -clear liquid diet- PRN IV and PO pain med  2. Alcohol use on a daily basis with chronic pancreatitis noted on CT abdomen -denies any history of DTs in the past or alcohol withdrawal seizures. -PRN IV Ativan -patient advised cessation from drinking.   3. Hyponatremia suspected from vomiting and poor PO intake -IV fluids  4. Hyperglycemia-- patient denies history of diabetes -check A1c -treat accordingly  5. Elevated blood pressure without diagnosis of hypertension -PRN hydralazine. Will give definitive PO meds if blood pressure remains persistently up  6. Tobacco abuse -smoking cessation advised. -Nicotine patch. Patient said he will ask if he needs it.   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 Needs further workup. Vascular consultation placed.   Dispo: The patient is from: Home              Anticipated d/c is to: Home              Anticipated d/c date is: 2 days              Patient currently is not medically stable to d/c.  family Communication :none today Consults : vascular Code Status : full DVT prophylaxis : on heparin drip  TOTAL TIME TAKING CARE OF THIS PATIENT: 50 minutes.    Enedina Finner M.D  [facility]     CC: Primary care physician; Patient, No Pcp Per

## 2019-05-29 NOTE — ED Provider Notes (Signed)
[facility] Emergency Department Provider Note  ____________________________________________   None    (approximate)  I have reviewed the triage vital signs and the nursing notes.   HISTORY  Chief Complaint Abdominal Pain, Emesis, and Fever    HPI Wyatt Montgomery is a 64 y.o. male with prior abdominal surgery who comes in for abdominal pain.  Patient states that he has had prior surgery on his liver but is not sure exactly what it was.  He reports just not feeling well for 2 weeks.  He is endorsing that his biggest concern is his abdominal pain.  The pain is moderate, over his entire abdomen, nothing makes it better, nothing makes it worse.  Reports having some fevers and vomiting as well.  No chest pain, shortness of breath, cough.  Denies any burning when he urinates.  States that he just has not been feeling well.  Denies having Covid before being Covid vaccinated          History reviewed. No pertinent past medical history.  There are no problems to display for this patient.   Past Surgical History:  Procedure Laterality Date  . ABDOMINAL SURGERY      Prior to Admission medications   Medication Sig Start Date End Date Taking? Authorizing Provider  diphenhydrAMINE (BENADRYL) 25 MG tablet Take 1 tablet (25 mg total) by mouth every 6 (six) hours as needed. 10/01/14   Sharman Cheek, MD  EPINEPHrine 0.3 mg/0.3 mL IJ SOAJ injection Inject 0.3 mLs (0.3 mg total) into the muscle once. Follow package instructions as needed for severe allergy or anaphylactic reaction. 10/01/14   Sharman Cheek, MD  predniSONE (DELTASONE) 20 MG tablet Take 2 tablets (40 mg total) by mouth daily. 10/01/14   Sharman Cheek, MD    Allergies Patient has no known allergies.  No family history on file.  Social History Social History   Tobacco Use  . Smoking status: Current Every Day Smoker    Packs/day: 1.00    Years: 20.00    Pack years: 20.00    Types:  Cigarettes  . Smokeless tobacco: Never Used  Substance Use Topics  . Alcohol use: Yes    Comment: drinks 40 oz of beer a day--last beer yesterday  . Drug use: No      Review of Systems Constitutional: Positive fevers Eyes: No visual changes. ENT: No sore throat. Cardiovascular: Denies chest pain. Respiratory: Denies shortness of breath. Gastrointestinal: Positive abdominal pain, nausea, vomiting no diarrhea.  No constipation. Genitourinary: Negative for dysuria. Musculoskeletal: Negative for back pain. Skin: Negative for rash. Neurological: Negative for headaches, focal weakness or numbness. All other ROS negative ____________________________________________   PHYSICAL EXAM:  VITAL SIGNS: ED Triage Vitals  Enc Vitals Group     BP 05/29/19 0938 (!) 133/91     Pulse Rate 05/29/19 0938 (!) 120     Resp 05/29/19 0938 16     Temp 05/29/19 0938 99.2 F (37.3 C)     Temp Source 05/29/19 0938 Oral     SpO2 05/29/19 0938 100 %     Weight 05/29/19 0939 155 lb (70.3 kg)     Height 05/29/19 0939 5\' 10"  (1.778 m)     Head Circumference --      Peak Flow --      Pain Score 05/29/19 0939 7     Pain Loc --      Pain Edu? --      Excl. in GC? --  Constitutional: Alert and oriented. Well appearing and in no acute distress. Eyes: Conjunctivae are normal. EOMI. Head: Atraumatic. Nose: No congestion/rhinnorhea. Mouth/Throat: Mucous membranes are moist.   Neck: No stridor. Trachea Midline. FROM Cardiovascular: Tachycardic, regular rhythm. Grossly normal heart sounds.  Good peripheral circulation. Respiratory: Normal respiratory effort.  No retractions. Lungs CTAB. Gastrointestinal: Slightly tender throughout his abdomen.  No distention. No abdominal bruits.  Musculoskeletal: No lower extremity tenderness nor edema.  No joint effusions. Neurologic:  Normal speech and language. No gross focal neurologic deficits are appreciated.  Psychiatric: Mood and affect are normal. Speech  and behavior are normal. GU: Deferred   ____________________________________________   LABS (all labs ordered are listed, but only abnormal results are displayed)  Labs Reviewed  COMPREHENSIVE METABOLIC PANEL - Abnormal; Notable for the following components:      Result Value   Sodium 128 (*)    Chloride 94 (*)    Glucose, Bld 194 (*)    Creatinine, Ser 1.37 (*)    AST 43 (*)    GFR calc non Af Amer 55 (*)    All other components within normal limits  CBC - Abnormal; Notable for the following components:   RBC 3.99 (*)    Hemoglobin 11.4 (*)    HCT 33.2 (*)    All other components within normal limits  SARS CORONAVIRUS 2 BY RT PCR (HOSPITAL ORDER, PERFORMED IN [facility])  LIPASE, BLOOD  URINALYSIS, COMPLETE (UACMP) WITH MICROSCOPIC  PROTIME-INR  APTT  HEMOGLOBIN A1C   ____________________________________________   ED ECG REPORT I, Concha Se, the attending physician, personally viewed and interpreted this ECG.  EKG sinus tachycardia rate of 112, no ST elevations, no T wave inversions, normal intervals ____________________________________________  RADIOLOGY  Official radiology report(s): CT ABDOMEN PELVIS W CONTRAST  Result Date: 05/29/2019 CLINICAL DATA:  Abdominal distension. EXAM: CT ABDOMEN AND PELVIS WITH CONTRAST TECHNIQUE: Multidetector CT imaging of the abdomen and pelvis was performed using the standard protocol following bolus administration of intravenous contrast. CONTRAST:  OMNIPAQUE IOHEXOL 300 MG/ML  SOLN COMPARISON:  None. FINDINGS: Lower chest: No acute abnormality. Hepatobiliary: Status post cholecystectomy. Left and right hepatic pneumobilia is noted. There is noted which appears to be acute thrombosis of the portal vein and a large portion of the superior mesenteric vein. Pancreas: Calcifications are noted in the pancreatic head consistent with chronic pancreatitis. Severe ductal dilatation is noted throughout the pancreas.  Possible neoplasm or malignancy in the pancreatic head cannot be excluded. Acute inflammation may be present as well. Spleen: Normal in size without focal abnormality. Adrenals/Urinary Tract: Adrenal glands appear normal. Left kidney and ureter are unremarkable. Urinary bladder is unremarkable. Moderate right renal atrophy is noted. Stomach/Bowel: Stomach is within normal limits. Appendix appears normal. No evidence of bowel wall thickening, distention, or inflammatory changes. Vascular/Lymphatic: Aortic atherosclerosis. No enlarged abdominal or pelvic lymph nodes. Reproductive: Prostate is unremarkable. Other: No abdominal wall hernia or abnormality. No abdominopelvic ascites. Musculoskeletal: No acute or significant osseous findings. IMPRESSION: 1. 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 2. Chronic pancreatitis is noted with severe ductal dilatation throughout the pancreas. Possible neoplasm or malignancy in the pancreatic head cannot be excluded. Acute inflammation may be present as well. MRI is recommended for further evaluation. 3. Moderate right renal atrophy. Aortic Atherosclerosis (ICD10-I70.0). Electronically Signed   By: Lupita Raider M.D.   On: 05/29/2019 13:38    ____________________________________________   PROCEDURES  Procedure(s) performed (including Critical  Care):  Marland KitchenCritical Care Performed by: Vanessa Chester, MD Authorized by: Vanessa [place], MD   Critical care provider statement:    Critical care time (minutes):  35   Critical care was necessary to treat or prevent imminent or life-threatening deterioration of the following conditions: thrombosis of portal vein requiring heparin    Critical care was time spent personally by me on the following activities:  Discussions with consultants, evaluation of patient's response to treatment, examination of patient, ordering and performing treatments and interventions, ordering and review of laboratory studies,  ordering and review of radiographic studies, pulse oximetry, re-evaluation of patient's condition, obtaining history from patient or surrogate and review of old charts     ____________________________________________   INITIAL IMPRESSION / ASSESSMENT AND PLAN / ED COURSE  Wyatt Montgomery was evaluated in Emergency Department on 05/29/2019 for the symptoms described in the history of present illness. He was evaluated in the context of the global COVID-19 pandemic, which necessitated consideration that the patient might be at risk for infection with the SARS-CoV-2 virus that causes COVID-19. Institutional protocols and algorithms that pertain to the evaluation of patients at risk for COVID-19 are in a state of rapid change based on information released by regulatory bodies including the CDC and federal and state organizations. These policies and algorithms were followed during the patient's care in the ED.    Patient is a 64 year old who comes in with abdominal pain, nausea, vomiting not really eating.  Patient does report using alcohol.  Will get CT scan to evaluate for acute pathology such as SBO, appendicitis, abscess, diverticulitis.  Will do labs to evaluate for cholecystitis, pancreatitis.  Will get Covid swab given patient has a report of some temperatures.  Patient given IV Dilaudid while pending CT scan  Patient's labs show some low sodium but patient is had this previously.  Patient CT scan concerning for chronic pancreatitis with concern for possible pancreatic cancer.  Recommended MRI.  There is also concern for acute thrombosis of the portal and superior mesenteric vein.  I discussed with Dr. Delana Meyer who recommends anticoagulation.  We will start patient on heparin.  We will discussed the hospital team for admission       ____________________________________________   FINAL CLINICAL IMPRESSION(S) / ED DIAGNOSES   Final diagnoses:  Thrombosis, portal vein  Thrombosis of  mesenteric vein (HCC)  Alcohol-induced chronic pancreatitis (HCC)  Nausea and vomiting, intractability of vomiting not specified, unspecified vomiting type  Hyponatremia      MEDICATIONS GIVEN DURING THIS VISIT:  Medications  sodium chloride flush (NS) 0.9 % injection 3 mL (has no administration in time range)  heparin bolus via infusion 4,500 Units (has no administration in time range)  heparin ADULT infusion 100 units/mL (25000 units/289mL sodium chloride 0.45%) (has no administration in time range)  sodium chloride 0.9 % bolus 1,000 mL (1,000 mLs Intravenous New Bag/Given 05/29/19 1250)  ondansetron (ZOFRAN) injection 4 mg (4 mg Intravenous Given 05/29/19 1253)  HYDROmorphone (DILAUDID) injection 0.5 mg (0.5 mg Intravenous Given 05/29/19 1253)  iohexol (OMNIPAQUE) 300 MG/ML solution 100 mL (100 mLs Intravenous Contrast Given 05/29/19 1314)     ED Discharge Orders    None       Note:  This document was prepared using Dragon voice recognition software and may include unintentional dictation errors.   Vanessa Suffolk, MD 05/29/19 [phone]

## 2019-05-29 NOTE — ED Triage Notes (Signed)
Says mid abd pain intermittent.  Says vomiting for 2 weeks.  Also fever and chills

## 2019-05-29 NOTE — Consult Note (Signed)
ANTICOAGULATION CONSULT NOTE - Initial Consult  Pharmacy Consult for Heparin Infusion Indication: Mesenteric Thrombus  No Known Allergies  Patient Measurements: Height: 5\' 10"  (177.8 cm) Weight: 70.3 kg (155 lb) IBW/kg (Calculated) : 73 Heparin Dosing Weight: 70.3 kg  Vital Signs: Temp: 99.2 F (37.3 C) (05/10 0938) Temp Source: Oral (05/10 0938) BP: 163/87 (05/10 1250) Pulse Rate: 81 (05/10 1250)  Labs: Recent Labs    05/29/19 0942  HGB 11.4*  HCT 33.2*  PLT 218  CREATININE 1.37*    Estimated Creatinine Clearance: 56.3 mL/min (A) (by C-G formula based on SCr of 1.37 mg/dL (H)).   Medical History: History reviewed. No pertinent past medical history.  Medications:  (Not in a hospital admission)  Scheduled:  . heparin  4,500 Units Intravenous Once  . sodium chloride flush  3 mL Intravenous Once   Infusions:  . heparin     PRN: LORazepam, morphine injection, ondansetron **OR** ondansetron (ZOFRAN) IV, oxyCODONE Anti-infectives (From admission, onward)   None      Assessment: Pharmacy has been consulted to initiate Heparin infusion in 64yo male with on and off abdominal pain diffuse with intermittent vomiting and poor appetite. Upon review of CT, patient was found with portal vein and superior centric vein thrombosis with some changes around chronic pancreatitis and dilated pancreatic duct.  Patient has no PTA anticoagulant use. Baseline labs have been ordered and are pending. Will initiate Heparin Drip immediately.  Goal of Therapy:  Heparin level 0.3-0.7 units/ml Monitor platelets by anticoagulation protocol: Yes   Plan:  Give 4500 units bolus x 1 Start heparin infusion at 1250 units/hr Check anti-Xa level in 6 hours and daily while on heparin Continue to monitor H&H and platelets  Almeta Geisel A Bascom Biel 05/29/2019,3:36 PM

## 2019-05-30 DIAGNOSIS — R109 Unspecified abdominal pain: Secondary | ICD-10-CM

## 2019-05-30 DIAGNOSIS — K55069 Acute infarction of intestine, part and extent unspecified: Secondary | ICD-10-CM

## 2019-05-30 DIAGNOSIS — F1010 Alcohol abuse, uncomplicated: Secondary | ICD-10-CM

## 2019-05-30 DIAGNOSIS — K55059 Acute (reversible) ischemia of intestine, part and extent unspecified: Secondary | ICD-10-CM

## 2019-05-30 DIAGNOSIS — I81 Portal vein thrombosis: Principal | ICD-10-CM

## 2019-05-30 DIAGNOSIS — K8689 Other specified diseases of pancreas: Secondary | ICD-10-CM

## 2019-05-30 DIAGNOSIS — R1909 Other intra-abdominal and pelvic swelling, mass and lump: Secondary | ICD-10-CM

## 2019-05-30 LAB — CBC
HCT: 31.2 % — ABNORMAL LOW (ref 39.0–52.0)
Hemoglobin: 10.4 g/dL — ABNORMAL LOW (ref 13.0–17.0)
MCH: 28.3 pg (ref 26.0–34.0)
MCHC: 33.3 g/dL (ref 30.0–36.0)
MCV: 84.8 fL (ref 80.0–100.0)
Platelets: 204 10*3/uL (ref 150–400)
RBC: 3.68 MIL/uL — ABNORMAL LOW (ref 4.22–5.81)
RDW: 14.6 % (ref 11.5–15.5)
WBC: 9 10*3/uL (ref 4.0–10.5)
nRBC: 0 % (ref 0.0–0.2)

## 2019-05-30 LAB — HEPARIN LEVEL (UNFRACTIONATED)
Heparin Unfractionated: 0.55 IU/mL (ref 0.30–0.70)
Heparin Unfractionated: 0.79 IU/mL — ABNORMAL HIGH (ref 0.30–0.70)

## 2019-05-30 MED ORDER — THIAMINE HCL 100 MG/ML IJ SOLN: 100.0000 mg | Freq: Every day | INTRAMUSCULAR | Status: DC

## 2019-05-30 MED ORDER — APIXABAN 5 MG PO TABS: 10.0000 mg | ORAL_TABLET | Freq: Two times a day (BID) | ORAL | Status: DC

## 2019-05-30 MED ORDER — FOLIC ACID 1 MG PO TABS
1.0000 mg | ORAL_TABLET | Freq: Every day | ORAL | Status: DC
  Administered 2019-05-30 – 2019-05-31 (×2): 1 mg via ORAL
  Filled 2019-05-30 (×2): qty 1

## 2019-05-30 MED ORDER — LORAZEPAM 2 MG/ML IJ SOLN
1.0000 mg | INTRAMUSCULAR | Status: DC | PRN
  Administered 2019-05-30: 1 mg via INTRAVENOUS
  Filled 2019-05-30: qty 1

## 2019-05-30 MED ORDER — LORAZEPAM 1 MG PO TABS: 1.0000 mg | ORAL_TABLET | ORAL | Status: DC | PRN

## 2019-05-30 MED ORDER — THIAMINE HCL 100 MG PO TABS
100.0000 mg | ORAL_TABLET | Freq: Every day | ORAL | Status: DC
  Administered 2019-05-30 – 2019-05-31 (×2): 100 mg via ORAL
  Filled 2019-05-30 (×2): qty 1

## 2019-05-30 MED ORDER — LORAZEPAM 2 MG/ML IJ SOLN
0.5000 mg | Freq: Four times a day (QID) | INTRAMUSCULAR | Status: DC
  Administered 2019-05-30: 0.5 mg via INTRAVENOUS
  Filled 2019-05-30: qty 1

## 2019-05-30 MED ORDER — APIXABAN 5 MG PO TABS: 5.0000 mg | ORAL_TABLET | Freq: Two times a day (BID) | ORAL | Status: DC

## 2019-05-30 MED ORDER — IBUPROFEN 400 MG PO TABS: 400.0000 mg | ORAL_TABLET | Freq: Three times a day (TID) | ORAL | Status: DC | PRN

## 2019-05-30 MED ORDER — ADULT MULTIVITAMIN W/MINERALS CH
1.0000 | ORAL_TABLET | Freq: Every day | ORAL | Status: DC
  Administered 2019-05-30 – 2019-05-31 (×2): 1 via ORAL
  Filled 2019-05-30 (×2): qty 1

## 2019-05-30 MED ORDER — APIXABAN 5 MG PO TABS
5.0000 mg | ORAL_TABLET | Freq: Two times a day (BID) | ORAL | Status: DC
  Administered 2019-05-30 – 2019-05-31 (×3): 5 mg via ORAL
  Filled 2019-05-30 (×3): qty 1

## 2019-05-30 NOTE — Consult Note (Signed)
ANTICOAGULATION CONSULT NOTE -  Pharmacy Consult for Heparin Infusion Indication: Mesenteric Thrombus  No Known Allergies  Patient Measurements: Height: 5\' 10"  (177.8 cm) Weight: 68.3 kg (150 lb 9.5 oz) IBW/kg (Calculated) : 73 Heparin Dosing Weight: 70.3 kg  Vital Signs: Temp: 100 F (37.8 C) (05/11 0215) Temp Source: Oral (05/11 0215) BP: 148/87 (05/11 0215) Pulse Rate: 86 (05/11 0215)  Labs: Recent Labs    05/29/19 0942 05/29/19 1630 05/30/19 0017  HGB 11.4*  --   --   HCT 33.2*  --   --   PLT 218  --   --   APTT  --  >160*  --   LABPROT  --  14.5  --   INR  --  1.2  --   HEPARINUNFRC  --   --  0.55  CREATININE 1.37*  --   --     Estimated Creatinine Clearance: 54.7 mL/min (A) (by C-G formula based on SCr of 1.37 mg/dL (H)).   Medical History: History reviewed. No pertinent past medical history.  Medications:  No medications prior to admission.   Scheduled:  . senna  1 tablet Oral BID  . sodium chloride flush  3 mL Intravenous Once   Infusions:  . heparin 1,250 Units/hr (05/29/19 1930)   PRN: acetaminophen **OR** acetaminophen, LORazepam, morphine injection, ondansetron **OR** ondansetron (ZOFRAN) IV, oxyCODONE Anti-infectives (From admission, onward)   None      Assessment: Pharmacy has been consulted to initiate Heparin infusion in 64yo male with on and off abdominal pain diffuse with intermittent vomiting and poor appetite. Upon review of CT, patient was found with portal vein and superior centric vein thrombosis with some changes around chronic pancreatitis and dilated pancreatic duct.  Patient has no PTA anticoagulant use. Baseline labs have been ordered and are pending. Will initiate Heparin Drip immediately.  Goal of Therapy:  Heparin level 0.3-0.7 units/ml Monitor platelets by anticoagulation protocol: Yes   Plan:  Give 4500 units bolus x 1 Start heparin infusion at 1250 units/hr Check anti-Xa level in 6 hours and daily while on  heparin Continue to monitor H&H and platelets   0511 0017 HL 0.55, therapeutic x 1.  Will continue Heparin drip at current rate and recheck HL in 6 hours to confirm.  64yo A 05/30/2019,2:46 AM

## 2019-05-30 NOTE — Progress Notes (Signed)
[facility]  - [place] at [facility]   PATIENT NAME: Wyatt Montgomery    MR#:  [id number]  DATE OF BIRTH:  1955-09-23  SUBJECTIVE:  patient was calm during my evaluation however according to RN he is trying to get up and wants to go home. He feels hungry. No abdominal pain.  REVIEW OF SYSTEMS:   Review of Systems  Constitutional: Negative for chills, fever and weight loss.  HENT: Negative for ear discharge, ear pain and nosebleeds.   Eyes: Negative for blurred vision, pain and discharge.  Respiratory: Negative for sputum production, shortness of breath, wheezing and stridor.   Cardiovascular: Negative for chest pain, palpitations, orthopnea and PND.  Gastrointestinal: Negative for abdominal pain, diarrhea, nausea and vomiting.  Genitourinary: Negative for frequency and urgency.  Musculoskeletal: Negative for back pain and joint pain.  Neurological: Negative for sensory change, speech change, focal weakness and weakness.  Psychiatric/Behavioral: Negative for depression and hallucinations. The patient is nervous/anxious.    Tolerating Diet: Tolerating PT:   DRUG ALLERGIES:  No Known Allergies  VITALS:  Blood pressure 124/89, pulse 61, temperature (!) 97.4 F (36.3 C), temperature source Oral, resp. rate 16, height 5\' 10"  (1.778 m), weight 68.3 kg, SpO2 95 %.  PHYSICAL EXAMINATION:   Physical Exam  GENERAL:  64 y.o.-year-old patient lying in the bed with no acute distress.  EYES: Pupils equal, round, reactive to light and accommodation. No scleral icterus.   HEENT: Head atraumatic, normocephalic. Oropharynx and nasopharynx clear.  NECK:  Supple, no jugular venous distention. No thyroid enlargement, no tenderness.  LUNGS: Normal breath sounds bilaterally, no wheezing, rales, rhonchi. No use of accessory muscles of respiration.  CARDIOVASCULAR: S1, S2 normal. No murmurs, rubs, or gallops.  ABDOMEN: Soft, nontender, nondistended. Bowel sounds present. No  organomegaly or mass.  EXTREMITIES: No cyanosis, clubbing or edema b/l.    NEUROLOGIC: Cranial nerves II through XII are intact. No focal Motor or sensory deficits b/l.   PSYCHIATRIC:  patient is alert and oriented x 3.  SKIN: No obvious rash, lesion, or ulcer.   LABORATORY PANEL:  CBC Recent Labs  Lab 05/30/19 0659  WBC 9.0  HGB 10.4*  HCT 31.2*  PLT 204    Chemistries  Recent Labs  Lab 05/29/19 0942  NA 128*  K 3.5  CL 94*  CO2 25  GLUCOSE 194*  BUN 9  CREATININE 1.37*  CALCIUM 9.0  AST 43*  ALT 15  ALKPHOS 98  BILITOT 0.5   Cardiac Enzymes No results for input(s): TROPONINI in the last 168 hours. RADIOLOGY:  DG Chest 2 View  Result Date: 05/29/2019 CLINICAL DATA:  Fever EXAM: CHEST - 2 VIEW COMPARISON:  05/09/2008 FINDINGS: The lungs are hyperinflated likely secondary to COPD. There is no focal consolidation. There is no pleural effusion or pneumothorax. The heart and mediastinal contours are unremarkable. There is no acute osseous abnormality. IMPRESSION: No active cardiopulmonary disease. 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 History of chronic pancreatitis. Evaluate for intra-abdominal abscess or infection. EXAM: MRI ABDOMEN WITHOUT AND WITH CONTRAST TECHNIQUE: Multiplanar multisequence MR imaging of the abdomen was performed both before and after the administration of intravenous contrast. CONTRAST:  91mL GADAVIST GADOBUTROL 1 MMOL/ML IV SOLN COMPARISON:  No prior abdominal MRI. CT the abdomen and pelvis 05/29/2019.  FINDINGS: Comment: Portions of today's examination are limited by considerable patient respiratory motion. Lower chest: Unremarkable. Hepatobiliary: No suspicious cystic or solid hepatic lesions. No intra or extrahepatic biliary ductal dilatation. Status post  cholecystectomy. Common bile duct measures 3 mm in the porta hepatis. Pancreas: Poorly defined mass-like area in the inferior aspect of the pancreatic head best appreciated on axial image 37 of series 32 and coronal image 48 of series 38) where this region is estimated to measure approximately 4.3 x 3.2 x 2.7 cm. This region is heterogeneous in signal intensity but predominantly T1 isointense and T2 iso to hyperintense. On post gadolinium imaging this region demonstrates heterogeneous internal enhancement with several small cystic appearing regions more medially and a solid appearing region more laterally which appears to invade the medial wall of the second portion of the duodenum (well appreciated on axial image 37 of series 39). Severe dilatation of the main pancreatic duct which measures up to 1.4 cm in the body of the pancreas. Atrophy throughout the body and tail of the pancreas. No peripancreatic fluid collections or inflammatory changes. Spleen:  Unremarkable. Adrenals/Urinary Tract: Mild atrophy of the right kidney. Right-sided extrarenal pelvis (normal anatomical variant). Left kidney and bilateral adrenal glands are normal in appearance. Stomach/Bowel: Profound mural thickening along the medial wall of the second portion of the duodenum concerning for direct invasion from suspected pancreatic head mass. Vascular/Lymphatic: Aortic atherosclerosis, without evidence of aneurysm in the abdominal vasculature. Nonocclusive thrombus in the distal superior mesenteric vein, at the splenoportal confluence, and in the proximal portal vein. The suspected mass in the head of the pancreas is well separated from the superior mesenteric artery and the celiac axis. The mass makes contact with the right lateral surface of the distal superior mesenteric vein as well as the splenoportal confluence. Other: No significant volume of ascites noted in the visualized portions of the peritoneal cavity. Musculoskeletal: No  aggressive appearing osseous lesions are noted in the visualized portions of the skeleton. IMPRESSION: 1. Mass-like appearance of the inferior aspect of the pancreatic head with possible direct invasion of the medial wall of the descending portion of the duodenum. This is associated with diffuse pancreatic ductal dilatation. Given the appearance on prior CT the abdomen and pelvis which clearly demonstrated abundant calcifications in this region, the possibility of acute on chronic pancreatitis resulting in this mass-like appearance is not excluded, however, underlying neoplasm is difficult to exclude on the basis of today's MR examination, and further evaluation with endoscopic ultrasound is strongly recommended. This is associated with nonobstructive thrombus in the distal superior mesenteric vein, splenoportal confluence and proximal portal vein. 2. No definitive signs of metastatic disease in the abdomen at this time. 3. Additional incidental findings, as above. Electronically Signed   By: Trudie Reed M.D.   On: 05/30/2019 08:54   CT ABDOMEN PELVIS W CONTRAST  Result Date: 05/29/2019 CLINICAL DATA:  Abdominal distension. EXAM: CT ABDOMEN AND PELVIS WITH CONTRAST TECHNIQUE: Multidetector CT imaging of the abdomen and pelvis was performed using the standard protocol following bolus administration of intravenous contrast. CONTRAST:  OMNIPAQUE IOHEXOL 300 MG/ML  SOLN COMPARISON:  None. FINDINGS: Lower chest: No acute abnormality. Hepatobiliary: Status post cholecystectomy. Left and right hepatic pneumobilia is noted. There is noted which appears to be acute thrombosis of the portal vein and a large portion of the superior mesenteric vein. Pancreas: Calcifications are noted in the pancreatic head consistent with chronic pancreatitis. Severe ductal dilatation is noted throughout the pancreas. Possible neoplasm or  malignancy in the pancreatic head cannot be excluded. Acute inflammation may be present as  well. Spleen: Normal in size without focal abnormality. Adrenals/Urinary Tract: Adrenal glands appear normal. Left kidney and ureter are unremarkable. Urinary bladder is unremarkable. Moderate right renal atrophy is noted. Stomach/Bowel: Stomach is within normal limits. Appendix appears normal. No evidence of bowel wall thickening, distention, or inflammatory changes. Vascular/Lymphatic: Aortic atherosclerosis. No enlarged abdominal or pelvic lymph nodes. Reproductive: Prostate is unremarkable. Other: No abdominal wall hernia or abnormality. No abdominopelvic ascites. Musculoskeletal: No acute or significant osseous findings. IMPRESSION: 1. 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 2. Chronic pancreatitis is noted with severe ductal dilatation throughout the pancreas. Possible neoplasm or malignancy in the pancreatic head cannot be excluded. Acute inflammation may be present as well. MRI is recommended for further evaluation. 3. Moderate right renal atrophy. Aortic Atherosclerosis (ICD10-I70.0). Electronically Signed   By: Marijo Conception M.D.   On: 05/29/2019 13:38   ASSESSMENT AND PLAN:   Estanislao Harmon  is a 64 y.o. 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 Denies any weight loss  1. 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 Mass-like appearance of the inferior aspect of the pancreatic head with possible direct invasion of the medial wall of the descending portion of the duodenum. This is associated with diffuse pancreatic ductal dilatation. Given the appearance on prior CT  the abdomen and pelvis which clearly demonstrated abundant calcifications in this region, the possibility of acute on chronic pancreatitis resulting in this mass-like appearance is not excluded, however, underlying neoplasm is difficult to exclude  -Dr. Delana Meyer aware. Recommends IV heparin drip -oncology consultation with Dr. Mike Gip-- message sent -clear liquid diet--- advance to soft diet -PRN IV and PO pain med  2. Alcohol use on a daily basis with chronic pancreatitis noted on CT abdomen -denies any history of DTs in the past or alcohol withdrawal seizures. -PRN IV Ativan -patient advised cessation from drinking.  -CIWA protocol  3. fever 102 last pm -BC pending -wbc normal, UA clean -so far not source identified  4. Hyperglycemia-- patient denies history of diabetes -check A1c--5.6 - 5. Elevated blood pressure without diagnosis of hypertension -PRN hydralazine. Will give definitive PO meds if blood pressure remains persistently up -bp stable  6. Tobacco abuse -smoking cessation advised. -Nicotine patch. Patient said he will ask if he needs it.   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 Needs further workup. Vascular consultation placed. MRI shows pancreatic mass-- ecology consultation placed   Dispo: The patient is from: Home  Anticipated d/c is to: Home  Anticipated d/c date is: 2 days  Patient currently is not medically stable to d/c.  family Communication :none today Consults : vascular, oncology Code Status : full DVT prophylaxis : on heparin drip        TOTAL TIME TAKING CARE OF THIS PATIENT: *35* minutes.  >50% time spent  on counselling and  coordination of care  Note: This dictation was prepared with Dragon dictation along with smaller phrase technology. Any transcriptional errors that result from this process are unintentional.  Enedina Finner M.D    [facility]   CC: Primary care physician; Patient, No Pcp PerPatient ID: Wyatt Montgomery, male   DOB: 1955/03/27, 64 y.o.   MRN: [id number]

## 2019-05-30 NOTE — Progress Notes (Signed)
MD notified that patient had temperature of 103.2 during previous shift. MD ordered Ibuprofen prn. Charge nurse notified that patient was a yellow MEWS in previous shift and is now a green MEWS.

## 2019-05-30 NOTE — Consult Note (Signed)
ANTICOAGULATION CONSULT NOTE -  Pharmacy Consult for Heparin Infusion Indication: Mesenteric Thrombus  No Known Allergies  Patient Measurements: Height: 5\' 10"  (177.8 cm) Weight: 68.3 kg (150 lb 9.5 oz) IBW/kg (Calculated) : 73 Heparin Dosing Weight: 70.3 kg  Vital Signs: Temp: 99.4 F (37.4 C) (05/11 0751) Temp Source: Oral (05/11 0751) BP: 119/61 (05/11 0804) Pulse Rate: 95 (05/11 0804)  Labs: Recent Labs    05/29/19 0942 05/29/19 1630 05/30/19 0017 05/30/19 0659  HGB 11.4*  --   --   --   HCT 33.2*  --   --   --   PLT 218  --   --   --   APTT  --  >160*  --   --   LABPROT  --  14.5  --   --   INR  --  1.2  --   --   HEPARINUNFRC  --   --  0.55 0.79*  CREATININE 1.37*  --   --   --     Estimated Creatinine Clearance: 54.7 mL/min (A) (by C-G formula based on SCr of 1.37 mg/dL (H)).   Medical History: History reviewed. No pertinent past medical history.  Medications:  No medications prior to admission.   Scheduled:  . senna  1 tablet Oral BID  . sodium chloride flush  3 mL Intravenous Once   Infusions:  . heparin 1,250 Units/hr (05/29/19 1930)   PRN: acetaminophen **OR** acetaminophen, LORazepam, morphine injection, ondansetron **OR** ondansetron (ZOFRAN) IV, oxyCODONE Anti-infectives (From admission, onward)   None      Assessment: Pharmacy has been consulted to initiate Heparin infusion in 64yo male with on and off abdominal pain diffuse with intermittent vomiting and poor appetite. Upon review of CT, patient was found with portal vein and superior centric vein thrombosis with some changes around chronic pancreatitis and dilated pancreatic duct.  Patient has no PTA anticoagulant use. Baseline labs have been ordered and are pending. Will initiate Heparin Drip immediately.  0511 0017 HL 0.55, therapeutic x 1.  Will continue Heparin drip at current rate 1250 units/hr   Goal of Therapy:  Heparin level 0.3-0.7 units/ml Monitor platelets by  anticoagulation protocol: Yes   Plan:  5/11 @0659  HL= 0.79. Slightly elevated. Will decrease Heparin drip to 1150 units/hr. Will recheck HL in 6 hours.   Isatou Agredano A 05/30/2019,8:07 AM

## 2019-05-30 NOTE — Progress Notes (Signed)
Cross cover brief note Radiology called with preliminary MRI reading. No pancreatic head mass identified. Confirmed CT findings with severe ductal dilation as well as portal and superior mesenteric veins (not completely occluded) Scan will also be reviewed by radiologist in am before official report results

## 2019-05-30 NOTE — Consult Note (Signed)
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  MRI abdomen with possible pancreatic head mass.  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  Chief Complaint: Wyatt Montgomery is a 64 y.o. 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  HPI: The patient notes a 2-week history of intermittent abdominal pain. Because of progressive abdominal pain, he presented to the [facility] ER. He denied any fevers but noted some nausea and vomiting. He states that he has had no appetite and has lost about 10 pounds in the past 2 weeks. He states that he typically drinks 4-5 beers a day. He denies any history of alcohol withdrawal.  Abdominal and pelvis CT scan on 05/29/2019 revealed acute thrombosis of the portal and superior mesenteric veins. There was chronic pancreatitis with severe ductal dilatation throughout the pancreas. Possible neoplasm or malignancy in the pancreatic head was not excluded.  Abdominal MRI on 05/30/2019 revealed a 4.3 x 3.2 x 2.7 cm poorly defined masslike area in the inferior aspect of the pancreatic head.  Region was heterogeneous but predominantly T1 isointense and T2 iso to hyperintense.  There was severe ductal dilatation of the main pancreatic duct (up to 1.4 cm) in the body of the pancreas.  There was possible direct invasion of the medial wall of the descending portion of the duodenum.  There were abundant calcifications in this region.  The possibility of acute on chronic pancreatitis resulting in this masslike appearance was not excluded.  Underlying neoplasm was difficult to exclude.  Consideration for endoscopic ultrasound was recommended.  Symptomatically, the patient is interested in  leaving the hospital.  He currently denies any significant abdominal discomfort.  He is on heparin.  He has been seen by vascular surgery.  Endovascular intervention is not indicated.  Consideration was made to transition from heparin to Eliquis.   History reviewed. No pertinent past medical history.  Past Surgical History:  Procedure Laterality Date  . ABDOMINAL SURGERY      No family history on file.  Social History:  reports that he has been smoking cigarettes. He has a 20.00 pack-year smoking history. He has never used smokeless tobacco. He reports current alcohol use. He reports that he does not use drugs.  The patient denies any exposure to radiation or toxins.  The patient lives in [place].  He is alone today.  Allergies: No Known Allergies  No medications prior to admission.    Review of Systems: GENERAL:  Feels tired.  No fevers, sweats.  Weight loss of 10 pounds in 2 weeks. PERFORMANCE STATUS (ECOG):  1-2 HEENT:  No visual changes, runny nose, sore throat, mouth sores or tenderness. Lungs: No shortness of breath or cough.  No hemoptysis. Cardiac:  No chest pain, palpitations, orthopnea, or PND. GI:  Decreased appetite x 2 weeks.  Nausea and vomiting x 1 prior to hospitalization.  No diarrhea, constipation, melena or hematochezia. GU:  No urgency, frequency, dysuria, or hematuria. Musculoskeletal:  No back pain.  No joint pain.  No muscle tenderness. Extremities:  No pain or swelling. Skin:  No rashes or skin changes. Neuro:  No headache, numbness or weakness, balance or coordination issues. Endocrine:  No diabetes, thyroid issues, hot flashes or night sweats. Psych:  No mood changes, depression or anxiety. Pain:  Mild upper abdominal discomfort/tightness. Review of systems:  All other systems reviewed and found to be negative.  Physical Exam:  Blood pressure 129/77, pulse 82, temperature 99 F (37.2 C), temperature source Oral, resp. rate 18, height 5\' 10"  (1.778 m),  weight 150 lb 9.5 oz (68.3 kg), SpO2 100 %.  GENERAL:  Well developed, well nourished, gentleman lying comfortably on the medical unit in no acute distress. MENTAL STATUS:  Alert and oriented to person, place and time. HEAD:  Short dark hair.  Mustache.  Normocephalic, atraumatic, face symmetric, no Cushingoid features. EYES:  Brown eyes.  Pupils equal round and reactive to light and accomodation.  No conjunctivitis or scleral icterus. ENT:  Oropharynx clear without lesion.  Tongue normal. Mucous membranes moist.  RESPIRATORY:  Clear to auscultation without rales, wheezes or rhonchi. CARDIOVASCULAR:  Regular rate and rhythm without murmur, rub or gallop. ABDOMEN:  Soft, minimally tender in the epigastric region.  No guarding or rebound tenderness.  Active bowel sounds, and no hepatosplenomegaly.  No masses. SKIN:  No rashes, ulcers or lesions. EXTREMITIES: No edema, no skin discoloration or tenderness.  No palpable cords. LYMPH NODES: No palpable cervical, supraclavicular, axillary or inguinal adenopathy  NEUROLOGICAL: Unremarkable. PSYCH:  Appropriate.   Results for orders placed or performed during the hospital encounter of 05/29/19 (from the past 48 hour(s))  Lipase, blood     Status: None   Collection Time: 05/29/19  9:42 AM  Result Value Ref Range   Lipase 35 11 - 51 U/L    Comment: Performed at [facility], [address] [place], [place]  Comprehensive metabolic panel     Status: Abnormal   Collection Time: 05/29/19  9:42 AM  Result Value Ref Range   Sodium 128 (L) 135 - 145 mmol/L   Potassium 3.5 3.5 - 5.1 mmol/L   Chloride 94 (L) 98 - 111 mmol/L   CO2 25 22 - 32 mmol/L   Glucose, Bld 194 (H) 70 - 99 mg/dL    Comment: Glucose reference range applies only to samples taken after fasting for at least 8 hours.   BUN 9 8 - 23 mg/dL   Creatinine, Ser 07/29/19 (H) 0.61 - 1.24 mg/dL   Calcium 9.0 8.9 - 1.69 mg/dL   Total Protein 7.1 6.5 - 8.1 g/dL   Albumin 3.5  3.5 - 5.0 g/dL   AST 43 (H) 15 - 41 U/L   ALT 15 0 - 44 U/L   Alkaline Phosphatase 98 38 - 126 U/L   Total Bilirubin 0.5 0.3 - 1.2 mg/dL   GFR calc non Af Amer 55 (L) >60 mL/min   GFR calc Af Amer >60 >60 mL/min   Anion gap 9 5 - 15    Comment: Performed at [facility], [address] [place], [place]  CBC     Status: Abnormal   Collection Time: 05/29/19  9:42 AM  Result Value Ref Range   WBC 5.0 4.0 - 10.5 K/uL   RBC 3.99 (L) 4.22 - 5.81 MIL/uL   Hemoglobin 11.4 (L) 13.0 - 17.0 g/dL   HCT 07/29/19 (L) 80.0 - 34.9 %   MCV 83.2 80.0 - 100.0 fL   MCH 28.6 26.0 - 34.0 pg   MCHC 34.3 30.0 - 36.0 g/dL   RDW 17.9 15.0 - 56.9 %   Platelets 218 150 - 400 K/uL   nRBC 0.0 0.0 - 0.2 %  Comment: Performed at [facility], [address] [place], [place] [address]  SARS Coronavirus 2 by RT PCR (hospital order, performed in [facility] hospital lab) Nasopharyngeal Urine, Clean Catch     Status: None   Collection Time: 05/29/19  2:05 PM   Specimen: Urine, Clean Catch; Nasopharyngeal  Result Value Ref Range   SARS Coronavirus 2 NEGATIVE NEGATIVE    Comment: (NOTE) SARS-CoV-2 target nucleic acids are NOT DETECTED. The SARS-CoV-2 RNA is generally detectable in upper and lower respiratory specimens during the acute phase of infection. The lowest concentration of SARS-CoV-2 viral copies this assay can detect is 250 copies / mL. A negative result does not preclude SARS-CoV-2 infection and should not be used as the sole basis for treatment or other patient management decisions.  A negative result may occur with improper specimen collection / handling, submission of specimen other than nasopharyngeal swab, presence of viral mutation(s) within the areas targeted by this assay, and inadequate number of viral copies (<250 copies / mL). A negative result must be combined with clinical observations, patient history, and epidemiological information. Fact Sheet for Patients:    BoilerBrush.com.cy Fact Sheet for Healthcare Providers: https://pope.com/ This test is not yet approved or cleared  by the [place] FDA and has been authorized for detection and/or diagnosis of SARS-CoV-2 by FDA under an Emergency Use Authorization (EUA).  This EUA will remain in effect (meaning this test can be used) for the duration of the COVID-19 declaration under Section 564(b)(1) of the Act, 21 U.S.C. section 360bbb-3(b)(1), unless the authorization is terminated or revoked sooner. Performed at [facility], [address] [place], [place] [address]   Urinalysis, Complete w Microscopic     Status: Abnormal   Collection Time: 05/29/19  2:35 PM  Result Value Ref Range   Color, Urine YELLOW (A) YELLOW   APPearance CLEAR (A) CLEAR   Specific Gravity, Urine >1.046 (H) 1.005 - 1.030   pH 7.0 5.0 - 8.0   Glucose, UA NEGATIVE NEGATIVE mg/dL   Hgb urine dipstick NEGATIVE NEGATIVE   Bilirubin Urine NEGATIVE NEGATIVE   Ketones, ur NEGATIVE NEGATIVE mg/dL   Protein, ur NEGATIVE NEGATIVE mg/dL   Nitrite NEGATIVE NEGATIVE   Leukocytes,Ua NEGATIVE NEGATIVE   RBC / HPF 0-5 0 - 5 RBC/hpf   WBC, UA 0-5 0 - 5 WBC/hpf   Bacteria, UA NONE SEEN NONE SEEN   Squamous Epithelial / LPF 0-5 0 - 5    Comment: Performed at [facility], [address] [place], [place] [address]  Hemoglobin A1c     Status: None   Collection Time: 05/29/19  3:19 PM  Result Value Ref Range   Hgb A1c MFr Bld 5.6 4.8 - 5.6 %    Comment: (NOTE) Pre diabetes:          5.7%-6.4% Diabetes:              >6.4% Glycemic control for   <7.0% adults with diabetes    Mean Plasma Glucose 114.02 mg/dL    Comment: Performed at [facility], [address], [place], [place] [address]  Protime-INR     Status: None   Collection Time: 05/29/19  4:30 PM  Result Value Ref Range   Prothrombin Time 14.5 11.4 - 15.2 seconds   INR 1.2 0.8 - 1.2     Comment: (NOTE) INR goal varies based on device and disease states. Performed at [facility], [address] [place], [place] [address]  APTT     Status: Abnormal   Collection Time: 05/29/19  4:30 PM  Result Value Ref Range   aPTT >160 (HH) 24 - 36 seconds    Comment:        IF BASELINE aPTT IS ELEVATED, SUGGEST PATIENT RISK ASSESSMENT BE USED TO DETERMINE APPROPRIATE ANTICOAGULANT THERAPY. CRITICAL RESULT CALLED TO, READ BACK BY AND VERIFIED WITH: JENNIFER PERKINS ON 05/29/19 AT 1746 QSD Performed at [facility], [address] [place], [place] [address]   Heparin level (unfractionated)     Status: None   Collection Time: 05/30/19 12:17 AM  Result Value Ref Range   Heparin Unfractionated 0.55 0.30 - 0.70 IU/mL    Comment: (NOTE) If heparin results are below expected values, and patient dosage has  been confirmed, suggest follow up testing of antithrombin III levels. Performed at [facility], [address] [place], [place] [address]   Heparin level (unfractionated)     Status: Abnormal   Collection Time: 05/30/19  6:59 AM  Result Value Ref Range   Heparin Unfractionated 0.79 (H) 0.30 - 0.70 IU/mL    Comment: (NOTE) If heparin results are below expected values, and patient dosage has  been confirmed, suggest follow up testing of antithrombin III levels. Performed at [facility], [address] [place], [place] [address]   CBC     Status: Abnormal   Collection Time: 05/30/19  6:59 AM  Result Value Ref Range   WBC 9.0 4.0 - 10.5 K/uL   RBC 3.68 (L) 4.22 - 5.81 MIL/uL   Hemoglobin 10.4 (L) 13.0 - 17.0 g/dL   HCT 91.4 (L) 78.2 - 95.6 %   MCV 84.8 80.0 - 100.0 fL   MCH 28.3 26.0 - 34.0 pg   MCHC 33.3 30.0 - 36.0 g/dL   RDW 21.3 08.6 - 57.8 %   Platelets 204 150 - 400 K/uL   nRBC 0.0 0.0 - 0.2 %    Comment: Performed at [facility], [address] [place], [place] [address]   DG Chest 2 View  Result Date:  05/29/2019 CLINICAL DATA:  Fever EXAM: CHEST - 2 VIEW COMPARISON:  05/09/2008 FINDINGS: The lungs are hyperinflated likely secondary to COPD. There is no focal consolidation. There is no pleural effusion or pneumothorax. The heart and mediastinal contours are unremarkable. There is no acute osseous abnormality. IMPRESSION: No active cardiopulmonary disease. 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 History of chronic pancreatitis. Evaluate for intra-abdominal abscess or infection. EXAM: MRI ABDOMEN WITHOUT AND WITH CONTRAST TECHNIQUE: Multiplanar multisequence MR imaging of the abdomen was performed both before and after the administration of intravenous contrast. CONTRAST:  7mL GADAVIST GADOBUTROL 1 MMOL/ML IV SOLN COMPARISON:  No prior abdominal MRI. CT the abdomen and pelvis 05/29/2019. FINDINGS: Comment: Portions of today's examination are limited by considerable patient respiratory motion. Lower chest: Unremarkable. Hepatobiliary: No suspicious cystic or solid hepatic lesions. No intra or extrahepatic biliary ductal dilatation. Status post cholecystectomy. Common bile duct measures 3 mm in the porta hepatis. Pancreas: Poorly defined mass-like area in the inferior aspect of the pancreatic head best appreciated on axial image 37 of series 32 and coronal image 48 of series 38) where this region is estimated to measure approximately 4.3 x 3.2 x 2.7 cm. This region  is heterogeneous in signal intensity but predominantly T1 isointense and T2 iso to hyperintense. On post gadolinium imaging this region demonstrates heterogeneous internal enhancement with several small cystic appearing regions more medially and a solid appearing region more laterally which appears to invade the medial wall of the second portion of the duodenum (well  appreciated on axial image 37 of series 39). Severe dilatation of the main pancreatic duct which measures up to 1.4 cm in the body of the pancreas. Atrophy throughout the body and tail of the pancreas. No peripancreatic fluid collections or inflammatory changes. Spleen:  Unremarkable. Adrenals/Urinary Tract: Mild atrophy of the right kidney. Right-sided extrarenal pelvis (normal anatomical variant). Left kidney and bilateral adrenal glands are normal in appearance. Stomach/Bowel: Profound mural thickening along the medial wall of the second portion of the duodenum concerning for direct invasion from suspected pancreatic head mass. Vascular/Lymphatic: Aortic atherosclerosis, without evidence of aneurysm in the abdominal vasculature. Nonocclusive thrombus in the distal superior mesenteric vein, at the splenoportal confluence, and in the proximal portal vein. The suspected mass in the head of the pancreas is well separated from the superior mesenteric artery and the celiac axis. The mass makes contact with the right lateral surface of the distal superior mesenteric vein as well as the splenoportal confluence. Other: No significant volume of ascites noted in the visualized portions of the peritoneal cavity. Musculoskeletal: No aggressive appearing osseous lesions are noted in the visualized portions of the skeleton. IMPRESSION: 1. Mass-like appearance of the inferior aspect of the pancreatic head with possible direct invasion of the medial wall of the descending portion of the duodenum. This is associated with diffuse pancreatic ductal dilatation. Given the appearance on prior CT the abdomen and pelvis which clearly demonstrated abundant calcifications in this region, the possibility of acute on chronic pancreatitis resulting in this mass-like appearance is not excluded, however, underlying neoplasm is difficult to exclude on the basis of today's MR examination, and further evaluation with endoscopic ultrasound is  strongly recommended. This is associated with nonobstructive thrombus in the distal superior mesenteric vein, splenoportal confluence and proximal portal vein. 2. No definitive signs of metastatic disease in the abdomen at this time. 3. Additional incidental findings, as above. Electronically Signed   By: Trudie Reedaniel  Entrikin M.D.   On: 05/30/2019 08:54   CT ABDOMEN PELVIS W CONTRAST  Result Date: 05/29/2019 CLINICAL DATA:  Abdominal distension. EXAM: CT ABDOMEN AND PELVIS WITH CONTRAST TECHNIQUE: Multidetector CT imaging of the abdomen and pelvis was performed using the standard protocol following bolus administration of intravenous contrast. CONTRAST:  100mL OMNIPAQUE IOHEXOL 300 MG/ML  SOLN COMPARISON:  None. FINDINGS: Lower chest: No acute abnormality. Hepatobiliary: Status post cholecystectomy. Left and right hepatic pneumobilia is noted. There is noted which appears to be acute thrombosis of the portal vein and a large portion of the superior mesenteric vein. Pancreas: Calcifications are noted in the pancreatic head consistent with chronic pancreatitis. Severe ductal dilatation is noted throughout the pancreas. Possible neoplasm or malignancy in the pancreatic head cannot be excluded. Acute inflammation may be present as well. Spleen: Normal in size without focal abnormality. Adrenals/Urinary Tract: Adrenal glands appear normal. Left kidney and ureter are unremarkable. Urinary bladder is unremarkable. Moderate right renal atrophy is noted. Stomach/Bowel: Stomach is within normal limits. Appendix appears normal. No evidence of bowel wall thickening, distention, or inflammatory changes. Vascular/Lymphatic: Aortic atherosclerosis. No enlarged abdominal or pelvic lymph nodes. Reproductive: Prostate is unremarkable. Other: No abdominal wall hernia or abnormality. No abdominopelvic ascites. Musculoskeletal: No  acute or significant osseous findings. IMPRESSION: 1. Acute thrombosis of the portal and superior  mesenteric veins is noted. 2. Chronic pancreatitis is noted with severe ductal dilatation throughout the pancreas. Possible neoplasm or malignancy in the pancreatic head cannot be excluded. Acute inflammation may be present as well. MRI is recommended for further evaluation. 3. Moderate right renal atrophy. Aortic Atherosclerosis (ICD10-I70.0). Electronically Signed   By: Marijo Conception M.D.   On: 05/29/2019 13:38    Assessment:  The patient is a 64 y.o. 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  Abdominal MRI on 05/30/2019 revealed a 4.3 x 3.2 x 2.7 cm poorly defined masslike area in the inferior aspect of the pancreatic head.  Region was heterogeneous but predominantly T1 isointense and T2 iso to hyperintense.  There was severe ductal dilatation of the main pancreatic duct (up to 1.4 cm) in the body of the pancreas.  There was possible direct invasion of the medial wall of the descending portion of the duodenum.  There were abundant calcifications in this region.  The possibility of acute on chronic pancreatitis resulting in this masslike appearance was not excluded.  Underlying neoplasm was difficult to exclude.   Symptomatically, he notes mild upper abdominal discomfort.  He has lost 10 pounds in the past 2 weeks.  Plan:   1.   Possible pancreatic head mass  Imaging personally reviewed.  Agree with radiology interpretation.  Unclear if pancreatic head mass is secondary to malignancy.  Consideration of an endoscopic ultrasound problematic given dense calcifications and limited ability to see the mass.   A negative EUS would not rule out a mass.  If procedure done in hospital, would need to be off heparin and procedure performed on 06/01/2019.  Could wait a few weeks before coming off anticoagulation and then perform procedure as an outpatient.  Check CA19-9.  Present at  tumor board on 06/01/2019. 2.   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  Check limited hypercoagulable work-up.  Patient notes no family history of thrombosis.  Patient currently on heparin.  Thank you for allowing me to participate in Wyatt Montgomery 's care.  I will follow him closely with you while hospitalized and after discharge in the outpatient department.   Lequita Asal, MD  05/30/2019, 9:35 PM

## 2019-05-30 NOTE — Plan of Care (Signed)

## 2019-05-30 NOTE — Consult Note (Signed)
[facility] Vascular Consult Note  MRN : [id number]  Wyatt Montgomery is a 63 y.o. (08/25/1955) male who presents with chief complaint of  Chief Complaint  Patient presents with  . Abdominal Pain  . Emesis  . Fever   History of Present Illness:  The patient is a 64 year old male with no significant past medical history with exception of active tobacco abuse who presented to the [facility] emergency department with chief complaint of progressively worsening abdominal pain.  Patient notes progressively worsening abdominal pain for approximately 2 weeks.  Patient reports intermittent fevers as well as nonbilious emesis.  Denies any chest pain or shortness of breath.  Patient underwent CT scan of the abdomen pelvis which was notable for chronic pancreatitis with possible pancreatic cancer.  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  Vascular surgery was consulted Dr. Allena Katz for portal vein/superior Safian thrombosis.  Current Facility-Administered Medications  Medication Dose Route Frequency Provider Last Rate Last Admin  . acetaminophen (TYLENOL) tablet 650 mg  650 mg Oral Q6H PRN Enedina Finner, MD   650 mg at 05/30/19 0551   Or  . acetaminophen (TYLENOL) suppository 650 mg  650 mg Rectal Q6H PRN Enedina Finner, MD      . ibuprofen (ADVIL) tablet 400 mg  400 mg Oral TID PRN Enedina Finner, MD      . morphine 2 MG/ML injection 1 mg  1 mg Intravenous Q4H PRN Enedina Finner, MD   1 mg at 05/29/19 1938  . ondansetron (ZOFRAN) tablet 4 mg  4 mg Oral Q6H PRN Enedina Finner, MD       Or  . ondansetron [facility]) injection 4 mg  4 mg Intravenous Q6H PRN Enedina Finner, MD      . oxyCODONE (Oxy IR/ROXICODONE) immediate release tablet 5 mg  5 mg Oral Q4H PRN Enedina Finner, MD   5 mg at 05/30/19 0829  . senna (SENOKOT) tablet 8.6 mg  1 tablet Oral BID Enedina Finner, MD   8.6 mg at 05/29/19 2348  . sodium chloride flush (NS) 0.9  % injection 3 mL  3 mL Intravenous Once Concha Se, MD       History reviewed. No pertinent past medical history.  Past Surgical History:  Procedure Laterality Date  . ABDOMINAL SURGERY     Social History Social History   Tobacco Use  . Smoking status: Current Every Day Smoker    Packs/day: 1.00    Years: 20.00    Pack years: 20.00    Types: Cigarettes  . Smokeless tobacco: Never Used  Substance Use Topics  . Alcohol use: Yes    Comment: drinks 40 oz of beer a day--last beer yesterday  . Drug use: No   Family History Denies family history of peripheral artery disease, venous disease or renal disease.  No Known Allergies  REVIEW OF SYSTEMS (Negative unless checked)  Constitutional: [] Weight loss  [x] Fever  [] Chills Cardiac: [] Chest pain   [] Chest pressure   [] Palpitations   [] Shortness of breath when laying flat   [] Shortness of breath at rest   [] Shortness of breath with exertion. Vascular:  [] Pain in legs with walking   [] Pain in legs at rest   [] Pain in legs when laying flat   [] Claudication   [] Pain in feet when walking  [] Pain in feet at rest  [] Pain in feet when laying flat   [] History of DVT   [] Phlebitis   []   Swelling in legs   [] Varicose veins   [] Non-healing ulcers Pulmonary:   [] Uses home oxygen   [] Productive cough   [] Hemoptysis   [] Wheeze  [] COPD   [] Asthma Neurologic:  [] Dizziness  [] Blackouts   [] Seizures   [] History of stroke   [] History of TIA  [] Aphasia   [] Temporary blindness   [] Dysphagia   [] Weakness or numbness in arms   [] Weakness or numbness in legs Musculoskeletal:  [] Arthritis   [] Joint swelling   [] Joint pain   [] Low back pain Hematologic:  [] Easy bruising  [] Easy bleeding   [] Hypercoagulable state   [] Anemic  [] Hepatitis Gastrointestinal:  [] Blood in stool   [] Vomiting blood  [] Gastroesophageal reflux/heartburn   [] Difficulty swallowing. Genitourinary:  [] Chronic kidney disease   [] Difficult urination  [] Frequent urination  [] Burning with urination    [] Blood in urine Skin:  [] Rashes   [] Ulcers   [] Wounds Psychological:  [] History of anxiety   []  History of major depression.  Positive for abdominal pain and nausea.  Physical Examination  Vitals:   05/30/19 0751 05/30/19 0804 05/30/19 1026 05/30/19 1217  BP:  119/61 100/61 124/89  Pulse:  95 68 61  Resp:   18 16  Temp: 99.4 F (37.4 C)  98.6 F (37 C) (!) 97.4 F (36.3 C)  TempSrc: Oral  Oral Oral  SpO2:  100% 100% 95%  Weight:      Height:       Body mass index is 21.61 kg/m. Gen:  WD/WN, NAD Head: [place]/AT, No temporalis wasting. Prominent temp pulse not noted. Ear/Nose/Throat: Hearing grossly intact, nares w/o erythema or drainage, oropharynx w/o Erythema/Exudate Eyes: Sclera non-icteric, conjunctiva clear Neck: Trachea midline.  No JVD.  Pulmonary:  Good air movement, respirations not labored, equal bilaterally.  Cardiac: RRR, normal S1, S2. Vascular:  Vessel Right Left  Radial Palpable Palpable  Ulnar Palpable Palpable  Brachial Palpable Palpable  Carotid Palpable, without bruit Palpable, without bruit  Aorta Not palpable N/A  Femoral Palpable Palpable  Popliteal Palpable Palpable  PT Palpable Palpable  DP Palpable Palpable   Gastrointestinal: soft, non-tender/non-distended. No guarding/reflex.  Musculoskeletal: M/S 5/5 throughout.  Extremities without ischemic changes.  No deformity or atrophy. No edema. Neurologic: Sensation grossly intact in extremities.  Symmetrical.  Speech is fluent. Motor exam as listed above. Psychiatric: Judgment intact, Mood & affect appropriate for pt's clinical situation. Dermatologic: No rashes or ulcers noted.  No cellulitis or open wounds. Lymph : No Cervical, Axillary, or Inguinal lymphadenopathy.  CBC Lab Results  Component Value Date   WBC 9.0 05/30/2019   HGB 10.4 (L) 05/30/2019   HCT 31.2 (L) 05/30/2019   MCV 84.8 05/30/2019   PLT 204 05/30/2019   BMET    Component Value Date/Time   NA 128 (L) 05/29/2019 0942   K  3.5 05/29/2019 0942   CL 94 (L) 05/29/2019 0942   CO2 25 05/29/2019 0942   GLUCOSE 194 (H) 05/29/2019 0942   BUN 9 05/29/2019 0942   CREATININE 1.37 (H) 05/29/2019 0942   CALCIUM 9.0 05/29/2019 0942   GFRNONAA 55 (L) 05/29/2019 0942   GFRAA >60 05/29/2019 0942   Estimated Creatinine Clearance: 54.7 mL/min (A) (by C-G formula based on SCr of 1.37 mg/dL (H)).  COAG Lab Results  Component Value Date   INR 1.2 05/29/2019   Radiology DG Chest 2 View  Result Date: 05/29/2019 CLINICAL DATA:  Fever EXAM: CHEST - 2 VIEW COMPARISON:  05/09/2008 FINDINGS: The lungs are hyperinflated likely secondary to COPD. There is no  focal consolidation. There is no pleural effusion or pneumothorax. The heart and mediastinal contours are unremarkable. There is no acute osseous abnormality. IMPRESSION: No active cardiopulmonary disease. 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 History of chronic pancreatitis. Evaluate for intra-abdominal abscess or infection. EXAM: MRI ABDOMEN WITHOUT AND WITH CONTRAST TECHNIQUE: Multiplanar multisequence MR imaging of the abdomen was performed both before and after the administration of intravenous contrast. CONTRAST:  30mL GADAVIST GADOBUTROL 1 MMOL/ML IV SOLN COMPARISON:  No prior abdominal MRI. CT the abdomen and pelvis 05/29/2019. FINDINGS: Comment: Portions of today's examination are limited by considerable patient respiratory motion. Lower chest: Unremarkable. Hepatobiliary: No suspicious cystic or solid hepatic lesions. No intra or extrahepatic biliary ductal dilatation. Status post cholecystectomy. Common bile duct measures 3 mm in the porta hepatis. Pancreas: Poorly defined mass-like area in the inferior aspect of the pancreatic head best appreciated on axial image 37 of series 32 and  coronal image 48 of series 38) where this region is estimated to measure approximately 4.3 x 3.2 x 2.7 cm. This region is heterogeneous in signal intensity but predominantly T1 isointense and T2 iso to hyperintense. On post gadolinium imaging this region demonstrates heterogeneous internal enhancement with several small cystic appearing regions more medially and a solid appearing region more laterally which appears to invade the medial wall of the second portion of the duodenum (well appreciated on axial image 37 of series 39). Severe dilatation of the main pancreatic duct which measures up to 1.4 cm in the body of the pancreas. Atrophy throughout the body and tail of the pancreas. No peripancreatic fluid collections or inflammatory changes. Spleen:  Unremarkable. Adrenals/Urinary Tract: Mild atrophy of the right kidney. Right-sided extrarenal pelvis (normal anatomical variant). Left kidney and bilateral adrenal glands are normal in appearance. Stomach/Bowel: Profound mural thickening along the medial wall of the second portion of the duodenum concerning for direct invasion from suspected pancreatic head mass. Vascular/Lymphatic: Aortic atherosclerosis, without evidence of aneurysm in the abdominal vasculature. Nonocclusive thrombus in the distal superior mesenteric vein, at the splenoportal confluence, and in the proximal portal vein. The suspected mass in the head of the pancreas is well separated from the superior mesenteric artery and the celiac axis. The mass makes contact with the right lateral surface of the distal superior mesenteric vein as well as the splenoportal confluence. Other: No significant volume of ascites noted in the visualized portions of the peritoneal cavity. Musculoskeletal: No aggressive appearing osseous lesions are noted in the visualized portions of the skeleton. IMPRESSION: 1. Mass-like appearance of the inferior aspect of the pancreatic head with possible direct invasion of the medial  wall of the descending portion of the duodenum. This is associated with diffuse pancreatic ductal dilatation. Given the appearance on prior CT the abdomen and pelvis which clearly demonstrated abundant calcifications in this region, the possibility of acute on chronic pancreatitis resulting in this mass-like appearance is not excluded, however, underlying neoplasm is difficult to exclude on the basis of today's MR examination, and further evaluation with endoscopic ultrasound is strongly recommended. This is associated with nonobstructive thrombus in the distal superior mesenteric vein, splenoportal confluence and proximal portal vein. 2. No definitive signs of metastatic disease in the abdomen at this time. 3. Additional incidental findings, as above. Electronically Signed   By: Reuel Boom  Entrikin M.D.   On: 05/30/2019 08:54   CT ABDOMEN PELVIS W CONTRAST  Result Date: 05/29/2019 CLINICAL DATA:  Abdominal distension. EXAM: CT ABDOMEN AND PELVIS WITH CONTRAST TECHNIQUE: Multidetector CT imaging of the abdomen and pelvis was performed using the standard protocol following bolus administration of intravenous contrast. CONTRAST:  168mL OMNIPAQUE IOHEXOL 300 MG/ML  SOLN COMPARISON:  None. FINDINGS: Lower chest: No acute abnormality. Hepatobiliary: Status post cholecystectomy. Left and right hepatic pneumobilia is noted. There is noted which appears to be acute thrombosis of the portal vein and a large portion of the superior mesenteric vein. Pancreas: Calcifications are noted in the pancreatic head consistent with chronic pancreatitis. Severe ductal dilatation is noted throughout the pancreas. Possible neoplasm or malignancy in the pancreatic head cannot be excluded. Acute inflammation may be present as well. Spleen: Normal in size without focal abnormality. Adrenals/Urinary Tract: Adrenal glands appear normal. Left kidney and ureter are unremarkable. Urinary bladder is unremarkable. Moderate right renal atrophy is  noted. Stomach/Bowel: Stomach is within normal limits. Appendix appears normal. No evidence of bowel wall thickening, distention, or inflammatory changes. Vascular/Lymphatic: Aortic atherosclerosis. No enlarged abdominal or pelvic lymph nodes. Reproductive: Prostate is unremarkable. Other: No abdominal wall hernia or abnormality. No abdominopelvic ascites. Musculoskeletal: No acute or significant osseous findings. IMPRESSION: 1. 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 2. Chronic pancreatitis is noted with severe ductal dilatation throughout the pancreas. Possible neoplasm or malignancy in the pancreatic head cannot be excluded. Acute inflammation may be present as well. MRI is recommended for further evaluation. 3. Moderate right renal atrophy. Aortic Atherosclerosis (ICD10-I70.0). Electronically Signed   By: Marijo Conception M.D.   On: 05/29/2019 13:38   Assessment/Plan The patient is a 64 year old male with no significant past medical history with exception of active tobacco abuse who presented to the [facility] emergency department with chief complaint of progressively worsening abdominal pain.  1. 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  Due to the location of thrombus endovascular intervention is not indicated at this time.  Agree with initiation of heparin and transition to Eliquis 5mg  PO BID for the 6 months.  We will continue to follow in the outpatient setting (will see in three months).  2. Possible pancreatic cancer: Oncology has been consulted  3.  Active tobacco abuse: We had a discussion for approximately five minutes regarding the absolute need for smoking cessation due to the deleterious nature of tobacco on the vascular system. We discussed the tobacco use would diminish patency of any  intervention, and likely significantly worsen progression of disease. We discussed multiple agents for quitting including replacement therapy or medications to reduce cravings such as Chantix. The patient voices their understanding of the importance of smoking cessation.  Discussed with Dr. Francene Castle, PA-C  05/30/2019 1:40 PM  This note was created with Dragon medical transcription system.  Any error is purely unintentional

## 2019-05-30 NOTE — Progress Notes (Signed)
ANTICOAGULATION CONSULT NOTE - Initial Consult  Pharmacy Consult for apixaban Indication:Portal vein thrombus  No Known Allergies  Patient Measurements: Height: 5\' 10"  (177.8 cm) Weight: 68.3 kg (150 lb 9.5 oz) IBW/kg (Calculated) : 73 Heparin Dosing Weight:    Vital Signs: Temp: 97.4 F (36.3 C) (05/11 1217) Temp Source: Oral (05/11 1217) BP: 124/89 (05/11 1217) Pulse Rate: 61 (05/11 1217)  Labs: Recent Labs    05/29/19 0942 05/29/19 1630 05/30/19 0017 05/30/19 0659  HGB 11.4*  --   --  10.4*  HCT 33.2*  --   --  31.2*  PLT 218  --   --  204  APTT  --  >160*  --   --   LABPROT  --  14.5  --   --   INR  --  1.2  --   --   HEPARINUNFRC  --   --  0.55 0.79*  CREATININE 1.37*  --   --   --     Estimated Creatinine Clearance: 54.7 mL/min (A) (by C-G formula based on SCr of 1.37 mg/dL (H)).   Medical History: History reviewed. No pertinent past medical history.  Medications:  Scheduled:  . apixaban  5 mg Oral BID  . senna  1 tablet Oral BID  . sodium chloride flush  3 mL Intravenous Once   Infusions:    Assessment: Pharmacy has been consulted to transition from Heparin infusion to apixaban in 64yo male with on and off abdominal pain diffuse with intermittent vomiting and poor appetite. Upon review of CT, patient was found with portal vein and superior centric vein thrombosis with some changes around chronic pancreatitis and dilated pancreatic duct.  Patient has no PTA anticoagulant use.  Hgb 10.4  Plt 204    Goal of Therapy:  Monitor platelets by anticoagulation protocol: Yes   Plan:  Will begin apixaban 5 mg po BID, Heparin discontinued. Per Vascular note 5/11:  Due to the location of thrombus endovascular intervention is not indicated at this time.  Agree with initiation of heparin and transition to Eliquis 5mg  PO BID for the 6 months.    Tyrene Nader A 05/30/2019,2:01 PM

## 2019-05-31 ENCOUNTER — Other Ambulatory Visit: Payer: Self-pay

## 2019-05-31 LAB — CBC
HCT: 28.4 % — ABNORMAL LOW (ref 39.0–52.0)
Hemoglobin: 9.3 g/dL — ABNORMAL LOW (ref 13.0–17.0)
MCH: 27.9 pg (ref 26.0–34.0)
MCHC: 32.7 g/dL (ref 30.0–36.0)
MCV: 85.3 fL (ref 80.0–100.0)
Platelets: 202 10*3/uL (ref 150–400)
RBC: 3.33 MIL/uL — ABNORMAL LOW (ref 4.22–5.81)
RDW: 14.7 % (ref 11.5–15.5)
WBC: 7.7 10*3/uL (ref 4.0–10.5)
nRBC: 0 % (ref 0.0–0.2)

## 2019-05-31 MED ORDER — APIXABAN 5 MG PO TABS: 5.0000 mg | ORAL_TABLET | Freq: Two times a day (BID) | ORAL | 3 refills | Status: DC

## 2019-05-31 MED ORDER — ADULT MULTIVITAMIN W/MINERALS CH: 1.0000 | ORAL_TABLET | Freq: Every day | ORAL | 0 refills | Status: DC

## 2019-05-31 NOTE — Progress Notes (Signed)
Pt d/c home via friends private vehicle.  D/c paperwork was reviewed with pt and he expressed understanding.  Eliquis was brought from pain clinic and packed with belongings.  Pt states he couldn't find his wallet - no key for security in chart, ED was called and they searched their lost and found and did not locate.  This nurse and aide searched all pts belongings, room, dirty linen, bed, chair, all drawers in room.  It was not found.  Pt stated if it was located to give him a call.  Nurse also spoke with person who picked pt up and she stated she felt like pts wife had it.  When nurse asked if she could look through her purse and bag wife refused.  Charge nurse is aware of situation.  Pt was wheeled down to med mall and went outside to smoke while ride was getting the car.  He was instructed by this nurse that this was a non smoking campus.  He walked out the front door and stated "shittt" he proceeded to walk to the side of the main ent and smoke there with his wife and within a couple of min the vallet workers again informed them they could not smoke and they both put their cigarettes out.  NAD noted upon discharge.  IV removed from pts L forearm.

## 2019-05-31 NOTE — Discharge Summary (Signed)
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 No pertinent past medical history.  HOSPITAL COURSE:   RobertComptonis a63 y.o.malewith a known history of alcohol use on a daily basis(history of DVT),history of some abdominal pain in the past comes to the emergency room with on and off abdominal pain diffuse with intermittent vomiting and poor appetite. 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 Mass-like appearance of the inferior aspect of the pancreatic head with possible direct invasion of the medial wall of the descending portion of the duodenum. This is associated with diffuse pancreatic ductal dilatation. Given the appearance on prior CT the abdomen and pelvis which clearly demonstrated abundant calcifications in this region, the possibility of acute on chronic pancreatitis resulting in this mass-like appearance is not excluded, however, underlying neoplasm is  difficult to exclude  -Dr. Gilda Crease aware. Recommends IV heparin drip-- now change to oral eliquis -oncology consultation with Dr. Elenor Quinones input. Patient will follow-up at the cancer center for further workup -tolerating soft diet   2.Alcohol use on a daily basis with chronic pancreatitis noted on CT abdomen -denies any history of DTs in the past or alcohol withdrawal seizures. -PRN IV Ativan -patient advised cessation from drinking. -CIWA protocol-- scoring zero this morning  3.fever 102 last pm -BC pending -wbc normal, UA clean -so far not source identified -no fever today  4.Hyperglycemia--patient denies history of diabetes -check A1c--5.6 - 5. Elevatedblood pressure without diagnosis of hypertension -PRN hydralazine. Will give definitive PO meds if blood pressure remains persistently up -bp stable  6.Tobacco abuse -smoking cessation advised.    Status is: Inpatient   Dispo: The patient is from:Home Anticipated d/c is GU:YQIH Anticipated d/c date is: May 12 Patient currentlyis medically stable to d/c. Patient will get his pancreatic mass workup as outpatient  family Communicationwife Roselyn Bering in the room Consults:vascular, oncology Code Status:full DVT prophylaxis:eliquis   CONSULTS OBTAINED:  Treatment Team:  Renford Dills, MD  DRUG ALLERGIES:  No Known Allergies  DISCHARGE MEDICATIONS:   Allergies as of 05/31/2019   No Known Allergies     Medication List    TAKE these medications   apixaban 5 MG Tabs tablet Commonly known as: ELIQUIS Take 1 tablet (5 mg total) by mouth 2 (two) times daily.   multivitamin with minerals Tabs tablet Take 1 tablet by mouth daily.       If you experience worsening of your admission symptoms, develop shortness of breath, life threatening emergency, suicidal or homicidal thoughts you must seek medical attention immediately by  calling 911 or calling your MD immediately  if symptoms less severe.  You Must read  complete instructions/literature along with all the possible adverse reactions/side effects for all the Medicines you take and that have been prescribed to you. Take any new Medicines after you have completely understood and accept all the possible adverse reactions/side effects.   Please note  You were cared for by a hospitalist during your hospital stay. If you have any questions about your discharge medications or the care you received while you were in the hospital after you are discharged, you can call the unit and asked to speak with the hospitalist on call if the hospitalist that took care of you is not available. Once you are discharged, your primary care physician will handle any further medical issues. Please note that NO REFILLS for any discharge medications will be authorized once you are discharged, as it is imperative that you return to your primary care physician (or establish a relationship with a primary care physician if you do not have one) for your aftercare needs so that they can reassess your need for medications and monitor your lab values. Today   SUBJECTIVE  very anxious to go home. Keeps coming out of the room to ask when he is going home. No signs of withdrawal. Seems patient is craving.  VITAL SIGNS:  Blood pressure 102/67, pulse 83, temperature 97.6 F (36.4 C), temperature source Oral, resp. rate 16, height 5\' 10"  (1.778 m), weight 68.3 kg, SpO2 100 %.  I/O:    Intake/Output Summary (Last 24 hours) at 05/31/2019 0838 Last data filed at 05/30/2019 1413 Gross per 24 hour  Intake 726.2 ml  Output --  Net 726.2 ml    PHYSICAL EXAMINATION:  GENERAL:  64 y.o.-year-old patient lying in the bed with no acute distress.  EYES: Pupils equal, round, reactive to light and accommodation. No scleral icterus.  HEENT: Head atraumatic, normocephalic. Oropharynx and nasopharynx clear.  NECK:   Supple, no jugular venous distention. No thyroid enlargement, no tenderness.  LUNGS: Normal breath sounds bilaterally, no wheezing, rales,rhonchi or crepitation. No use of accessory muscles of respiration.  CARDIOVASCULAR: S1, S2 normal. No murmurs, rubs, or gallops.  ABDOMEN: Soft, non-tender, non-distended. Bowel sounds present. No organomegaly or mass.  EXTREMITIES: No pedal edema, cyanosis, or clubbing.  NEUROLOGIC: Cranial nerves II through XII are intact. Muscle strength 5/5 in all extremities. Sensation intact. Gait not checked.  PSYCHIATRIC: The patient is alert and oriented x 3.  SKIN: No obvious rash, lesion, or ulcer.   DATA REVIEW:   CBC  Recent Labs  Lab 05/31/19 0510  WBC 7.7  HGB 9.3*  HCT 28.4*  PLT 202    Chemistries  Recent Labs  Lab 05/29/19 0942  NA 128*  K 3.5  CL 94*  CO2 25  GLUCOSE 194*  BUN 9  CREATININE 1.37*  CALCIUM 9.0  AST 43*  ALT 15  ALKPHOS 98  BILITOT 0.5    Microbiology Results   Recent Results (from the past 240 hour(s))  SARS Coronavirus 2 by RT PCR (hospital order, performed in [facility] hospital lab) Nasopharyngeal Urine, Clean Catch     Status: None   Collection Time: 05/29/19  2:05 PM   Specimen: Urine, Clean Catch; Nasopharyngeal  Result Value Ref Range Status   SARS Coronavirus 2 NEGATIVE NEGATIVE Final    Comment: (NOTE) SARS-CoV-2 target nucleic acids are NOT DETECTED. The SARS-CoV-2 RNA is generally detectable in upper and lower respiratory specimens during the acute phase of infection. The lowest concentration of SARS-CoV-2 viral copies this assay can detect is  250 copies / mL. A negative result does not preclude SARS-CoV-2 infection and should not be used as the sole basis for treatment or other patient management decisions.  A negative result may occur with improper specimen collection / handling, submission of specimen other than nasopharyngeal swab, presence of viral mutation(s) within the areas targeted by  this assay, and inadequate number of viral copies (<250 copies / mL). A negative result must be combined with clinical observations, patient history, and epidemiological information. Fact Sheet for Patients:   StrictlyIdeas.no Fact Sheet for Healthcare Providers: BankingDealers.co.za This test is not yet approved or cleared  by the [place] FDA and has been authorized for detection and/or diagnosis of SARS-CoV-2 by FDA under an Emergency Use Authorization (EUA).  This EUA will remain in effect (meaning this test can be used) for the duration of the COVID-19 declaration under Section 564(b)(1) of the Act, 21 U.S.C. section 360bbb-3(b)(1), unless the authorization is terminated or revoked sooner. Performed at [facility], [address] [place], [place] [address]   CULTURE, BLOOD (ROUTINE X 2) w Reflex to ID Panel     Status: None (Preliminary result)   Collection Time: 05/30/19  6:59 AM   Specimen: BLOOD  Result Value Ref Range Status   Specimen Description BLOOD LEFT ANTECUBITAL  Final   Special Requests   Final    BOTTLES DRAWN AEROBIC AND ANAEROBIC Blood Culture adequate volume   Culture   Final    NO GROWTH < 24 HOURS Performed at [facility], [address] [place], [place] [address]    Report Status PENDING  Incomplete  CULTURE, BLOOD (ROUTINE X 2) w Reflex to ID Panel     Status: None (Preliminary result)   Collection Time: 05/30/19  7:09 AM   Specimen: BLOOD  Result Value Ref Range Status   Specimen Description BLOOD BLOOD LEFT FOREARM  Final   Special Requests   Final    BOTTLES DRAWN AEROBIC AND ANAEROBIC Blood Culture adequate volume   Culture   Final    NO GROWTH < 24 HOURS Performed at [facility], [address], [place], [place] [address]    Report Status PENDING  Incomplete    RADIOLOGY:  DG Chest 2 View  Result Date: 05/29/2019 CLINICAL DATA:  Fever EXAM: CHEST - 2 VIEW  COMPARISON:  05/09/2008 FINDINGS: The lungs are hyperinflated likely secondary to COPD. There is no focal consolidation. There is no pleural effusion or pneumothorax. The heart and mediastinal contours are unremarkable. There is no acute osseous abnormality. IMPRESSION: No active cardiopulmonary disease. Electronically Signed   By: Kathreen Devoid   On: 05/29/2019 13:55   MR Abdomen W or Wo Contrast  Result Date: 05/30/2019 CLINICAL DATA:  64 year old male with history of acute thrombosis of the portal and superior mesenteric veins. History of chronic pancreatitis. Evaluate for intra-abdominal abscess or infection. EXAM: MRI ABDOMEN WITHOUT AND WITH CONTRAST TECHNIQUE: Multiplanar multisequence MR imaging of the abdomen was performed both before and after the administration of intravenous contrast. CONTRAST:  37mL GADAVIST GADOBUTROL 1 MMOL/ML IV SOLN COMPARISON:  No prior abdominal MRI. CT the abdomen and pelvis 05/29/2019. FINDINGS: Comment: Portions of today's examination are limited by considerable patient respiratory motion. Lower chest: Unremarkable. Hepatobiliary: No suspicious cystic or solid hepatic lesions. No intra or extrahepatic biliary ductal dilatation. Status post cholecystectomy. Common bile duct measures 3 mm in the porta hepatis. Pancreas: Poorly defined mass-like area in the inferior aspect of the pancreatic head best  appreciated on axial image 37 of series 32 and coronal image 48 of series 38) where this region is estimated to measure approximately 4.3 x 3.2 x 2.7 cm. This region is heterogeneous in signal intensity but predominantly T1 isointense and T2 iso to hyperintense. On post gadolinium imaging this region demonstrates heterogeneous internal enhancement with several small cystic appearing regions more medially and a solid appearing region more laterally which appears to invade the medial wall of the second portion of the duodenum (well appreciated on axial image 37 of series 39). Severe  dilatation of the main pancreatic duct which measures up to 1.4 cm in the body of the pancreas. Atrophy throughout the body and tail of the pancreas. No peripancreatic fluid collections or inflammatory changes. Spleen:  Unremarkable. Adrenals/Urinary Tract: Mild atrophy of the right kidney. Right-sided extrarenal pelvis (normal anatomical variant). Left kidney and bilateral adrenal glands are normal in appearance. Stomach/Bowel: Profound mural thickening along the medial wall of the second portion of the duodenum concerning for direct invasion from suspected pancreatic head mass. Vascular/Lymphatic: Aortic atherosclerosis, without evidence of aneurysm in the abdominal vasculature. Nonocclusive thrombus in the distal superior mesenteric vein, at the splenoportal confluence, and in the proximal portal vein. The suspected mass in the head of the pancreas is well separated from the superior mesenteric artery and the celiac axis. The mass makes contact with the right lateral surface of the distal superior mesenteric vein as well as the splenoportal confluence. Other: No significant volume of ascites noted in the visualized portions of the peritoneal cavity. Musculoskeletal: No aggressive appearing osseous lesions are noted in the visualized portions of the skeleton. IMPRESSION: 1. Mass-like appearance of the inferior aspect of the pancreatic head with possible direct invasion of the medial wall of the descending portion of the duodenum. This is associated with diffuse pancreatic ductal dilatation. Given the appearance on prior CT the abdomen and pelvis which clearly demonstrated abundant calcifications in this region, the possibility of acute on chronic pancreatitis resulting in this mass-like appearance is not excluded, however, underlying neoplasm is difficult to exclude on the basis of today's MR examination, and further evaluation with endoscopic ultrasound is strongly recommended. This is associated with  nonobstructive thrombus in the distal superior mesenteric vein, splenoportal confluence and proximal portal vein. 2. No definitive signs of metastatic disease in the abdomen at this time. 3. Additional incidental findings, as above. Electronically Signed   By: Trudie Reed M.D.   On: 05/30/2019 08:54   CT ABDOMEN PELVIS W CONTRAST  Result Date: 05/29/2019 CLINICAL DATA:  Abdominal distension. EXAM: CT ABDOMEN AND PELVIS WITH CONTRAST TECHNIQUE: Multidetector CT imaging of the abdomen and pelvis was performed using the standard protocol following bolus administration of intravenous contrast. CONTRAST:  OMNIPAQUE IOHEXOL 300 MG/ML  SOLN COMPARISON:  None. FINDINGS: Lower chest: No acute abnormality. Hepatobiliary: Status post cholecystectomy. Left and right hepatic pneumobilia is noted. There is noted which appears to be acute thrombosis of the portal vein and a large portion of the superior mesenteric vein. Pancreas: Calcifications are noted in the pancreatic head consistent with chronic pancreatitis. Severe ductal dilatation is noted throughout the pancreas. Possible neoplasm or malignancy in the pancreatic head cannot be excluded. Acute inflammation may be present as well. Spleen: Normal in size without focal abnormality. Adrenals/Urinary Tract: Adrenal glands appear normal. Left kidney and ureter are unremarkable. Urinary bladder is unremarkable. Moderate right renal atrophy is noted. Stomach/Bowel: Stomach is within normal limits. Appendix appears normal. No evidence of bowel wall  thickening, distention, or inflammatory changes. Vascular/Lymphatic: Aortic atherosclerosis. No enlarged abdominal or pelvic lymph nodes. Reproductive: Prostate is unremarkable. Other: No abdominal wall hernia or abnormality. No abdominopelvic ascites. Musculoskeletal: No acute or significant osseous findings. IMPRESSION: 1. 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 2. Chronic pancreatitis is  noted with severe ductal dilatation throughout the pancreas. Possible neoplasm or malignancy in the pancreatic head cannot be excluded. Acute inflammation may be present as well. MRI is recommended for further evaluation. 3. Moderate right renal atrophy. Aortic Atherosclerosis (ICD10-I70.0). Electronically Signed   By: Lupita RaiderJames  Green Jr M.D.   On: 05/29/2019 13:38     CODE STATUS:     Code Status Orders  (From admission, onward)         Start     Ordered   05/29/19 2140  Full code  Continuous     05/29/19 2139        Code Status History    This patient has a current code status but no historical code status.   Advance Care Planning Activity       TOTAL TIME TAKING CARE OF THIS PATIENT: 40 minutes.    Enedina FinnerSona Hilery Wintle M.D  [facility]    CC: Primary care physician; Patient, No Pcp Per

## 2019-05-31 NOTE — Discharge Instructions (Signed)
Stop drinking alcohol and stop smoking

## 2019-05-31 NOTE — Progress Notes (Signed)
[facility]  [address], [address] [place], [place] [address] Phone: [phone]  Fax: [phone]   Clinic Day:  06/02/2019  Referring physician: No ref. provider found  Chief Complaint: Wyatt CroakRobert M Montgomery is a 64 y.o. 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   HPI:   The patient [facility] from [facility] - 05/31/2019. He presented with a 2 week history of intermittent abdominal pain. Because of progressive abdominal pain, he presented to [facility]. He denied any fevers but noted some nausea and vomiting. He states that he has had no appetite and has lost about 10 pounds in the past 2 weeks. He states that he typically drinks 4-5 beers a day. He denies any history of alcohol withdrawal.  Abdominal and pelvis CT scan on 05/29/2019 revealed acute thrombosis of the portal and superior mesenteric veins. There was chronic pancreatitis with severe ductal dilatation throughout the pancreas. Possible neoplasm or malignancy in the pancreatic head was not excluded.  Abdominal MRI on 05/30/2019 revealed a 4.3 x 3.2 x 2.7 cm poorly defined masslike area in the inferior aspect of the pancreatic head. Region was heterogeneous but predominantly T1 isointense and T2 iso to hyperintense.  There was severe ductal dilatation of the main pancreatic duct (up to 1.4 cm) in the body of the pancreas.  There was possible direct invasion of the medial wall of the descending portion of the duodenum.  There were abundant calcifications in this region.  The possibility of acute on chronic pancreatitis resulting in this masslike appearance was not excluded.  Underlying neoplasm was difficult to exclude.  Consideration for endoscopic ultrasound was recommended.  He was started on heparin.  He has been seen by vascular surgery. Endovascular intervention was not indicated. He  declined endoscopic ultrasound in the hospital. Heparin was converted to Eliquis.  CA19-9 was 132 (0-35) on 05/31/2019.  Symptomatically, he is doing well today. He hasn't had any abdominal pain since he left the hospital. He has been taking Eliquis twice a day. His appetite has been a lot better since he has left the hospital. He denies any pain when he eats. His weight is now 138 lb and has been stable since he began eating better. He denies any blood in his stool or urine. He now drinks around 3 beers a day; he used to drink 9 beers a day. Yesterday for dinner he ate 2 pieces of chicken, 3 potato wedges, and fried okra.    Past Medical History:  Diagnosis Date  . Thrombosis, portal vein     Past Surgical History:  Procedure Laterality Date  . ABDOMINAL SURGERY      History reviewed. No pertinent family history.  Social History:  reports that he has been smoking cigarettes. He has a 20.00 pack-year smoking history. He has never used smokeless tobacco. He reports current alcohol use. He reports that he does not use drugs. He cut down the amount of beers he drinks a day from 9 beers to 3 beers. The [place] in [place]. The patient is accompanied by his wife Wyatt Montgomery on the iPad today.   Allergies: No Known Allergies  Current Medications: Current Outpatient Medications  Medication Sig Dispense Refill  . apixaban (ELIQUIS) 5 MG TABS tablet Take 1 tablet (5 mg total) by mouth 2 (two) times daily. 60 tablet 3  . Multiple Vitamin (MULTIVITAMIN WITH MINERALS) TABS tablet  Take 1 tablet by mouth daily. 30 tablet 0   No current facility-administered medications for this visit.    Review of Systems  Constitutional: Positive for weight loss (stable at 138 lb). Negative for chills, diaphoresis, fever and malaise/fatigue.       Feeling good.   HENT: Negative for congestion, ear pain, hearing loss, sore throat and tinnitus.   Eyes: Negative for blurred vision and double vision.  Respiratory:  Negative for cough, shortness of breath and wheezing.   Cardiovascular: Negative for chest pain, palpitations, leg swelling and PND.  Gastrointestinal: Positive for nausea and vomiting. Negative for abdominal pain (mild upper abdominal discomfort/tightness; improved), blood in stool, constipation, diarrhea, heartburn and melena.       Appetite has improved.  Genitourinary: Negative for dysuria, frequency and urgency.  Musculoskeletal: Negative for back pain, falls, joint pain and myalgias.  Skin: Negative for itching and rash.  Neurological: Negative for dizziness, sensory change, speech change, focal weakness, weakness and headaches.  Endo/Heme/Allergies: Negative for environmental allergies. Does not bruise/bleed easily.  Psychiatric/Behavioral: Negative for depression. The patient is not nervous/anxious and does not have insomnia.   All other systems reviewed and are negative.   Performance status (ECOG): 1 - Symptomatic but completely ambulatory  Vitals Blood pressure (!) 163/93, pulse 77, temperature (!) 96 F (35.6 C), temperature source Tympanic, resp. rate 16, weight 138 lb 9 oz (62.9 kg), SpO2 100 %.   Physical Exam Constitutional:      General: He is not in acute distress.    Appearance: Normal appearance. He is well-developed and well-nourished. He is not diaphoretic.     Interventions: Face mask in place.  HENT:     Head: Normocephalic and atraumatic.     Mouth/Throat:     Mouth: Oropharynx is clear and moist and mucous membranes are normal. No oral lesions.      Comments: Short dark hair. Mustache. Eyes:     General: No scleral icterus.       Right eye: No discharge.        Left eye: No discharge.     Extraocular Movements: EOM normal.     Conjunctiva/sclera: Conjunctivae normal.     Pupils: Pupils are equal, round, and reactive to light.     Comments: Brown eyes.   Neck:     Vascular: No JVD.  Cardiovascular:     Rate and Rhythm: Normal rate and regular rhythm.      Heart sounds: Normal heart sounds. No murmur heard.  No friction rub. No gallop.   Pulmonary:     Effort: Pulmonary effort is normal.     Breath sounds: Normal breath sounds. No wheezing, rhonchi or rales.  Abdominal:     General: Bowel sounds are normal.     Palpations: Abdomen is soft. There is no hepatosplenomegaly or mass.     Tenderness: There is no abdominal tenderness. There is no CVA tenderness.  Musculoskeletal:        General: No tenderness or edema. Normal range of motion.     Cervical back: Normal range of motion and neck supple.  Lymphadenopathy:     Head:     Right side of head: No preauricular, posterior auricular or occipital adenopathy.     Left side of head: No preauricular, posterior auricular or occipital adenopathy.     Cervical: No cervical adenopathy.     Right cervical: No superficial cervical adenopathy.    Left cervical: No superficial cervical adenopathy.  Upper Body:  No axillary adenopathy present.    Right upper body: No supraclavicular adenopathy.     Left upper body: No supraclavicular adenopathy.     Lower Body: No right inguinal adenopathy. No left inguinal adenopathy.  Skin:    General: Skin is warm, dry and intact.     Coloration: Skin is not pale.     Findings: No bruising, erythema, lesion or rash.  Neurological:     Mental Status: He is alert and oriented to person, place, and time.  Psychiatric:        Mood and Affect: Mood and affect normal.        Behavior: Behavior normal.        Thought Content: Thought content normal.        Judgment: Judgment normal.    Admission on 05/29/2019, Discharged on 05/31/2019  Component Date Value Ref Range Status  . Lipase 05/29/2019 35  11 - 51 U/L Final   Performed at [facility], [address] [place] [place], [place] [address]  . Sodium 05/29/2019 128* 135 - 145 mmol/L Final  . Potassium 05/29/2019 3.5  3.5 - 5.1 mmol/L Final  . Chloride 05/29/2019 94* 98 - 111 mmol/L Final  . CO2  05/29/2019 25  22 - 32 mmol/L Final  . Glucose, Bld 05/29/2019 194* 70 - 99 mg/dL Final   Glucose reference range applies only to samples taken after fasting for at least 8 hours.  . BUN 05/29/2019 9  8 - 23 mg/dL Final  . Creatinine, Ser 05/29/2019 1.37* 0.61 - 1.24 mg/dL Final  . Calcium 19/14/7829 9.0  8.9 - 10.3 mg/dL Final  . Total Protein 05/29/2019 7.1  6.5 - 8.1 g/dL Final  . Albumin 56/21/3086 3.5  3.5 - 5.0 g/dL Final  . AST 57/84/6962 43* 15 - 41 U/L Final  . ALT 05/29/2019 15  0 - 44 U/L Final  . Alkaline Phosphatase 05/29/2019 98  38 - 126 U/L Final  . Total Bilirubin 05/29/2019 0.5  0.3 - 1.2 mg/dL Final  . GFR calc non Af Amer 05/29/2019 55* >60 mL/min Final  . GFR calc Af Amer 05/29/2019 >60  >60 mL/min Final  . Anion gap 05/29/2019 9  5 - 15 Final   Performed at [facility], [address] [place], [place] [address]  . WBC 05/29/2019 5.0  4.0 - 10.5 K/uL Final  . RBC 05/29/2019 3.99* 4.22 - 5.81 MIL/uL Final  . Hemoglobin 05/29/2019 11.4* 13.0 - 17.0 g/dL Final  . HCT 13/24/4010 33.2* 39.0 - 52.0 % Final  . MCV 05/29/2019 83.2  80.0 - 100.0 fL Final  . MCH 05/29/2019 28.6  26.0 - 34.0 pg Final  . MCHC 05/29/2019 34.3  30.0 - 36.0 g/dL Final  . RDW 27/25/3664 14.3  11.5 - 15.5 % Final  . Platelets 05/29/2019 218  150 - 400 K/uL Final  . nRBC 05/29/2019 0.0  0.0 - 0.2 % Final   Performed at [facility], [address], [place], [place] [address]  . Color, Urine 05/29/2019 YELLOW* YELLOW Final  . APPearance 05/29/2019 CLEAR* CLEAR Final  . Specific Gravity, Urine 05/29/2019 >1.046* 1.005 - 1.030 Final  . pH 05/29/2019 7.0  5.0 - 8.0 Final  . Glucose, UA 05/29/2019 NEGATIVE  NEGATIVE mg/dL Final  . Hgb urine dipstick 05/29/2019 NEGATIVE  NEGATIVE Final  . Bilirubin Urine 05/29/2019 NEGATIVE  NEGATIVE Final  . Ketones, ur 05/29/2019 NEGATIVE  NEGATIVE mg/dL Final  . Protein, ur  05/29/2019 NEGATIVE  NEGATIVE mg/dL Final  . Nitrite 76/73/4193  NEGATIVE  NEGATIVE Final  . Leukocytes,Ua 05/29/2019 NEGATIVE  NEGATIVE Final  . RBC / HPF 05/29/2019 0-5  0 - 5 RBC/hpf Final  . WBC, UA 05/29/2019 0-5  0 - 5 WBC/hpf Final  . Bacteria, UA 05/29/2019 NONE SEEN  NONE SEEN Final  . Squamous Epithelial / LPF 05/29/2019 0-5  0 - 5 Final   Performed at [facility], [address] [place], [place] [address]  . SARS Coronavirus 2 05/29/2019 NEGATIVE  NEGATIVE Final   Comment: (NOTE) SARS-CoV-2 target nucleic acids are NOT DETECTED. The SARS-CoV-2 RNA is generally detectable in upper and lower respiratory specimens during the acute phase of infection. The lowest concentration of SARS-CoV-2 viral copies this assay can detect is 250 copies / mL. A negative result does not preclude SARS-CoV-2 infection and should not be used as the sole basis for treatment or other patient management decisions.  A negative result may occur with improper specimen collection / handling, submission of specimen other than nasopharyngeal swab, presence of viral mutation(s) within the areas targeted by this assay, and inadequate number of viral copies (<250 copies / mL). A negative result must be combined with clinical observations, patient history, and epidemiological information. Fact Sheet for Patients:   BoilerBrush.com.cy Fact Sheet for Healthcare Providers: https://pope.com/ This test is not yet approved or cleared                           by the [place] FDA and has been authorized for detection and/or diagnosis of SARS-CoV-2 by FDA under an Emergency Use Authorization (EUA).  This EUA will remain in effect (meaning this test can be used) for the duration of the COVID-19 declaration under Section 564(b)(1) of the Act, 21 U.S.C. section 360bbb-3(b)(1), unless the authorization is terminated or revoked sooner. Performed at [facility], [address] [place], [place] [address]   .  Hgb A1c MFr Bld 05/29/2019 5.6  4.8 - 5.6 % Final   Comment: (NOTE) Pre diabetes:          5.7%-6.4% Diabetes:              >6.4% Glycemic control for   <7.0% adults with diabetes   . Mean Plasma Glucose 05/29/2019 114.02  mg/dL Final   Performed at [facility], [address] [place], [place] [address]  . Prothrombin Time 05/29/2019 14.5  11.4 - 15.2 seconds Final  . INR 05/29/2019 1.2  0.8 - 1.2 Final   Comment: (NOTE) INR goal varies based on device and disease states. Performed at [facility], [address] [place], [place] [address]   . aPTT 05/29/2019 >160* 24 - 36 seconds Final   Comment:        IF BASELINE aPTT IS ELEVATED, SUGGEST PATIENT RISK ASSESSMENT BE USED TO DETERMINE APPROPRIATE ANTICOAGULANT THERAPY. CRITICAL RESULT CALLED TO, READ BACK BY AND VERIFIED WITH: JENNIFER PERKINS ON 05/29/19 AT 1746 QSD Performed at [facility], [address], [place], [place] [address]   . Heparin Unfractionated 05/30/2019 0.55  0.30 - 0.70 IU/mL Final   Comment: (NOTE) If heparin results are below expected values, and patient dosage has  been confirmed, suggest follow up testing of antithrombin III levels. Performed at [facility], [address] [place], [place] [address]   . Heparin Unfractionated 05/30/2019 0.79* 0.30 - 0.70 IU/mL Final   Comment: (  NOTE) If heparin results are below expected values, and patient dosage has  been confirmed, suggest follow up testing of antithrombin III levels. Performed at [facility], [address], [place], [place] [address]   . WBC 05/30/2019 9.0  4.0 - 10.5 K/uL Final  . RBC 05/30/2019 3.68* 4.22 - 5.81 MIL/uL Final  . Hemoglobin 05/30/2019 10.4* 13.0 - 17.0 g/dL Final  . HCT 64/40/3474 31.2* 39.0 - 52.0 % Final  . MCV 05/30/2019 84.8  80.0 - 100.0 fL Final  . MCH 05/30/2019 28.3  26.0 - 34.0 pg Final  . MCHC 05/30/2019 33.3  30.0 - 36.0 g/dL Final  . RDW 25/95/6387 14.6  11.5 -  15.5 % Final  . Platelets 05/30/2019 204  150 - 400 K/uL Final  . nRBC 05/30/2019 0.0  0.0 - 0.2 % Final   Performed at [facility], [address], [place], [place] [address]  . Specimen Description 05/30/2019 BLOOD LEFT ANTECUBITAL   Final  . Special Requests 05/30/2019 BOTTLES DRAWN AEROBIC AND ANAEROBIC Blood Culture adequate volume   Final  . Culture  Setup Time 05/30/2019    Final                   Value:GRAM POSITIVE COCCI AEROBIC BOTTLE ONLY CRITICAL VALUE NOTED.  VALUE IS CONSISTENT WITH PREVIOUSLY REPORTED AND CALLED VALUE. Performed at [facility], [address] [place], [place] [address]   . Culture 05/30/2019 GRAM POSITIVE COCCI   Final  . Report Status 05/30/2019 PENDING   Incomplete  . Specimen Description 05/30/2019    Final                   Value:BLOOD BLOOD LEFT FOREARM Performed at [facility], [address] [place] [place], [place] [address]   . Special Requests 05/30/2019    Final                   Value:BOTTLES DRAWN AEROBIC AND ANAEROBIC Blood Culture adequate volume Performed at [facility], [address] [place] [place], [place] [address]   . Culture  Setup Time 05/30/2019    Final                   Value:GRAM POSITIVE COCCI AEROBIC BOTTLE ONLY CRITICAL RESULT CALLED TO, READ BACK BY AND VERIFIED WITH: Valrie Hart [facility] [id number] 06/01/19 HNM Performed at [facility], [address], [place], [place] [address]   . Culture 05/30/2019 GRAM POSITIVE COCCI   Final  . Report Status 05/30/2019 PENDING   Incomplete  . WBC 05/31/2019 7.7  4.0 - 10.5 K/uL Final  . RBC 05/31/2019 3.33* 4.22 - 5.81 MIL/uL Final  . Hemoglobin 05/31/2019 9.3* 13.0 - 17.0 g/dL Final  . HCT 57/32/2025 28.4* 39.0 - 52.0 % Final  . MCV 05/31/2019 85.3  80.0 - 100.0 fL Final  . MCH 05/31/2019 27.9  26.0 - 34.0 pg Final  . MCHC 05/31/2019 32.7  30.0 - 36.0 g/dL Final  . RDW 42/70/6237 14.7  11.5 - 15.5 % Final  . Platelets 05/31/2019 202  150 - 400 K/uL  Final  . nRBC 05/31/2019 0.0  0.0 - 0.2 % Final   Performed at [facility], [address], [place], [place] [address]  . CA 19-9 05/31/2019 132* 0 - 35 U/mL Final   Comment: (NOTE) **Verified by repeat analysis** Roche Diagnostics Electrochemiluminescence Immunoassay (ECLIA) Values obtained with different assay methods or kits cannot be used  interchangeably.  Results cannot be interpreted as absolute evidence of the presence or absence of malignant disease. Performed At: [facility] [address] [place], [place] [id number] Jolene Schimke MD RC:[id number]   . Enterococcus species 05/30/2019 NOT DETECTED  NOT DETECTED Final  . Listeria monocytogenes 05/30/2019 NOT DETECTED  NOT DETECTED Final  . Staphylococcus species 05/30/2019 DETECTED* NOT DETECTED Final   Comment: Methicillin (oxacillin) resistant coagulase negative staphylococcus. Possible blood culture contaminant (unless isolated from more than one blood culture draw or clinical case suggests pathogenicity). No antibiotic treatment is indicated for blood  culture contaminants. CRITICAL RESULT CALLED TO, READ BACK BY AND VERIFIED WITH: Valrie Hart PHARMD [id number] 05/02/19 HNM   . Staphylococcus aureus (BCID) 05/30/2019 NOT DETECTED  NOT DETECTED Final  . Methicillin resistance 05/30/2019 DETECTED* NOT DETECTED Final   Comment: CRITICAL RESULT CALLED TO, READ BACK BY AND VERIFIED WITH: Valrie Hart PHARMD [id number] 06/01/19 HNM   . Streptococcus species 05/30/2019 NOT DETECTED  NOT DETECTED Final  . Streptococcus agalactiae 05/30/2019 NOT DETECTED  NOT DETECTED Final  . Streptococcus pneumoniae 05/30/2019 NOT DETECTED  NOT DETECTED Final  . Streptococcus pyogenes 05/30/2019 NOT DETECTED  NOT DETECTED Final  . Acinetobacter baumannii 05/30/2019 NOT DETECTED  NOT DETECTED Final  . Enterobacteriaceae species 05/30/2019 NOT DETECTED  NOT DETECTED Final  . Enterobacter cloacae complex 05/30/2019 NOT DETECTED  NOT DETECTED  Final  . Escherichia coli 05/30/2019 NOT DETECTED  NOT DETECTED Final  . Klebsiella oxytoca 05/30/2019 NOT DETECTED  NOT DETECTED Final  . Klebsiella pneumoniae 05/30/2019 NOT DETECTED  NOT DETECTED Final  . Proteus species 05/30/2019 NOT DETECTED  NOT DETECTED Final  . Serratia marcescens 05/30/2019 NOT DETECTED  NOT DETECTED Final  . Haemophilus influenzae 05/30/2019 NOT DETECTED  NOT DETECTED Final  . Neisseria meningitidis 05/30/2019 NOT DETECTED  NOT DETECTED Final  . Pseudomonas aeruginosa 05/30/2019 NOT DETECTED  NOT DETECTED Final  . Candida albicans 05/30/2019 NOT DETECTED  NOT DETECTED Final  . Candida glabrata 05/30/2019 NOT DETECTED  NOT DETECTED Final  . Candida krusei 05/30/2019 NOT DETECTED  NOT DETECTED Final  . Candida parapsilosis 05/30/2019 NOT DETECTED  NOT DETECTED Final  . Candida tropicalis 05/30/2019 NOT DETECTED  NOT DETECTED Final   Performed at [facility], [address] [place] [place], [place] [address]    Assessment:  HISAO DOO is a 64 y.o. 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  Abdominal MRI on 05/30/2019 revealed a 4.3 x 3.2 x 2.7 cm poorly defined masslike area in the inferior aspect of the pancreatic head.  Region was heterogeneous but predominantly T1 isointense and T2 iso to hyperintense.  There was severe ductal dilatation of the main pancreatic duct (up to 1.4 cm) in the body of the pancreas.  There was possible direct invasion of the medial wall of the descending portion of the duodenum.  There were abundant calcifications in this region.  The possibility of acute on chronic pancreatitis resulting in this masslike appearance was not excluded.  Underlying neoplasm was difficult to exclude.  CA19-9 was 132 (0-35) on 05/31/2019.   Symptomatically, he feels good.  He denies any abdominal pain.  He denies any bruising or bleeding on  Eliquis.  Hemoglobin 9.5.  Plan: 1.   Labs today:  CBC with diff, factor V Leiden, prothrombin gene mutation, lupus anticoagulant panel, anticardiolipin antibodies, beta2-glycoprotein. 2.   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             Unclear if pancreatic head mass is secondary to malignancy.              Discuss plan for endoscopic ultrasound.    Discuss plan for evaluation by [facility] for endoscopy (procedure at [facility] or [facility]).         3.   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             Hypercoagulable work-up today.             Continue Eliquis. 4.   Normocytic anemia  Patient denies any bleeding.  Diet has improved.  Anemia work-up today:  ferritin, iron studies, retic, B12, folate. 5.   MD to contact Mariea Clonts, RN regarding endoscopic ultrasound. 6.   RTC in 1 month for MD assessment and review of work-up and discussion regarding direction of therapy.  I discussed the assessment and treatment plan with the patient. The patient was provided an opportunity to ask questions and all were answered. The patient agreed with the plan and demonstrated an understanding of the instructions. The patient was advised to call back if the symptoms worsen or if the condition fails to improve as anticipated.  I provided 19 minutes (1:40 PM - 1:59 PM) of face-to-face time during this this encounter and > 50% was spent counseling as documented under my assessment and plan. An additional 10-12 minutes were spent reviewing his chart (Epic and Care Everywhere) including notes, labs, and imaging studies and coordinating care.    Lequita Asal, MD, PhD    06/02/2019, 1:40 PM  I, Heywood Footman, am acting as Education administrator for [employer]. Mike Gip, MD, PhD.  I,  C. Mike Gip, MD, have reviewed the above documentation for accuracy and completeness, and I agree with the above.

## 2019-05-31 NOTE — TOC Transition Note (Signed)
Transition of Care [facility]) - Progression Note    Patient Details  Name: Wyatt Montgomery MRN: [id number] Date of Birth: 1955/03/03  Transition [facility]) CM/SW Contact  Barrie Dunker, RN Phone Number: 05/31/2019, 10:24 AM  Clinical Narrative:    Provided the patient with 1 month supply of Eliquis from medication [facility]gt He was given the application to get ongoing medication from Med [facility] as well I explained to him and his wife that to continue getting the medication for free he will need to fill out He states agreement      Barriers to Discharge: Barriers Resolved  Expected Discharge Plan and Services     Discharge Planning Services: CM Consult, Medication Assistance   Living arrangements for the past 2 months: Single Family Home Expected Discharge Date: 05/31/19               DME Arranged: N/A         HH Arranged: NA           Social Determinants of Health (SDOH) Interventions    Readmission Risk Interventions No flowsheet data found.

## 2019-05-31 NOTE — TOC Transition Note (Signed)
Transition of Care [facility]) - CM/SW Discharge Note   Patient Details  Name: Wyatt Montgomery MRN: [id number] Date of Birth: [birth year]  Transition of Care [facility]) CM/SW Contact:  Su Hilt, RN Phone Number: 05/31/2019, 8:45 AM   Clinical Narrative:    Met with the patient to speak of DC plan, I explained that his medications were sent to [facility] across the street and that we would pick them up for him to take home, he would need to wait a few min to give them time to fill them and [place] to pick them up, He said he would wait, he was encouraged to stay in his room until that happens   Final next level of care: Home/Self Care Barriers to Discharge: Barriers Resolved   Patient Goals and CMS Choice Patient states their goals for this hospitalization and ongoing recovery are:: go home      Discharge Placement                       Discharge Plan and Services   Discharge Planning Services: CM Consult, Medication Assistance            DME Arranged: N/A         HH Arranged: NA          Social Determinants of Health (SDOH) Interventions     Readmission Risk Interventions No flowsheet data found.

## 2019-05-31 NOTE — TOC Progression Note (Signed)
Transition of Care [facility]) - Progression Note    Patient Details  Name: ROMEN YUTZY MRN: [id number] Date of Birth: May 06, 1955  Transition of Care [facility]) CM/SW Contact  Barrie Dunker, RN Phone Number: 05/31/2019, 8:50 AM  Clinical Narrative:    I called over to med mgt to ask that they call me when the patient's medications are ready so that they can be picked up, [place] Sink stated that it would be 45 min to an hour but they would call me when ready     Barriers to Discharge: Barriers Resolved  Expected Discharge Plan and Services     Discharge Planning Services: CM Consult, Medication Assistance   Living arrangements for the past 2 months: Single Family Home Expected Discharge Date: 05/31/19               DME Arranged: N/A         HH Arranged: NA           Social Determinants of Health (SDOH) Interventions    Readmission Risk Interventions No flowsheet data found.

## 2019-06-01 LAB — CANCER ANTIGEN 19-9: CA 19-9: 132 U/mL — ABNORMAL HIGH (ref 0–35)

## 2019-06-01 LAB — BLOOD CULTURE ID PANEL (REFLEXED)

## 2019-06-02 ENCOUNTER — Other Ambulatory Visit: Payer: Self-pay

## 2019-06-02 ENCOUNTER — Encounter: Payer: Self-pay | Admitting: Hematology and Oncology

## 2019-06-02 ENCOUNTER — Inpatient Hospital Stay: Payer: Self-pay

## 2019-06-02 ENCOUNTER — Telehealth: Payer: Self-pay

## 2019-06-02 ENCOUNTER — Inpatient Hospital Stay: Payer: Self-pay | Attending: Hematology and Oncology | Admitting: Hematology and Oncology

## 2019-06-02 VITALS — BP 163/93 | HR 77 | Temp 96.0°F | Resp 16 | Wt 138.6 lb

## 2019-06-02 DIAGNOSIS — R978 Other abnormal tumor markers: Secondary | ICD-10-CM | POA: Insufficient documentation

## 2019-06-02 DIAGNOSIS — K8689 Other specified diseases of pancreas: Secondary | ICD-10-CM

## 2019-06-02 DIAGNOSIS — F17210 Nicotine dependence, cigarettes, uncomplicated: Secondary | ICD-10-CM | POA: Insufficient documentation

## 2019-06-02 DIAGNOSIS — F1020 Alcohol dependence, uncomplicated: Secondary | ICD-10-CM | POA: Insufficient documentation

## 2019-06-02 DIAGNOSIS — K869 Disease of pancreas, unspecified: Secondary | ICD-10-CM | POA: Insufficient documentation

## 2019-06-02 DIAGNOSIS — I81 Portal vein thrombosis: Secondary | ICD-10-CM

## 2019-06-02 DIAGNOSIS — K55069 Acute infarction of intestine, part and extent unspecified: Secondary | ICD-10-CM | POA: Insufficient documentation

## 2019-06-02 DIAGNOSIS — Z7901 Long term (current) use of anticoagulants: Secondary | ICD-10-CM | POA: Insufficient documentation

## 2019-06-02 DIAGNOSIS — D649 Anemia, unspecified: Secondary | ICD-10-CM

## 2019-06-02 DIAGNOSIS — Z8719 Personal history of other diseases of the digestive system: Secondary | ICD-10-CM | POA: Insufficient documentation

## 2019-06-02 LAB — CBC WITH DIFFERENTIAL/PLATELET
Abs Immature Granulocytes: 0.04 10*3/uL (ref 0.00–0.07)
Basophils Absolute: 0.1 10*3/uL (ref 0.0–0.1)
Basophils Relative: 1 %
Eosinophils Absolute: 0.1 10*3/uL (ref 0.0–0.5)
Eosinophils Relative: 1 %
HCT: 29.5 % — ABNORMAL LOW (ref 39.0–52.0)
Hemoglobin: 9.5 g/dL — ABNORMAL LOW (ref 13.0–17.0)
Immature Granulocytes: 1 %
Lymphocytes Relative: 34 %
Lymphs Abs: 1.6 10*3/uL (ref 0.7–4.0)
MCH: 27.5 pg (ref 26.0–34.0)
MCHC: 32.2 g/dL (ref 30.0–36.0)
MCV: 85.3 fL (ref 80.0–100.0)
Monocytes Absolute: 0.7 10*3/uL (ref 0.1–1.0)
Monocytes Relative: 14 %
Neutro Abs: 2.4 10*3/uL (ref 1.7–7.7)
Neutrophils Relative %: 49 %
Platelets: 316 10*3/uL (ref 150–400)
RBC: 3.46 MIL/uL — ABNORMAL LOW (ref 4.22–5.81)
RDW: 15.1 % (ref 11.5–15.5)
WBC: 4.8 10*3/uL (ref 4.0–10.5)
nRBC: 0 % (ref 0.0–0.2)

## 2019-06-02 NOTE — Progress Notes (Signed)
No new changes noted today 

## 2019-06-02 NOTE — Telephone Encounter (Signed)
To inform him that some of his blood culture came back + from the hosptial. If he starts to develop a fever for him to contact the office as soon as possible. The patient was understanding and agreeable.

## 2019-06-02 NOTE — Patient Instructions (Signed)
  Ladora Daniel, nurse navigator for gastroenterology (GI).

## 2019-06-03 LAB — VITAMIN B12: Vitamin B-12: 315 pg/mL (ref 180–914)

## 2019-06-03 LAB — FERRITIN
Ferritin: 42 ng/mL (ref 24–336)
Ferritin: 46 ng/mL (ref 24–336)

## 2019-06-03 LAB — IRON AND TIBC
Iron: 32 ug/dL — ABNORMAL LOW (ref 45–182)
Iron: 34 ug/dL — ABNORMAL LOW (ref 45–182)
Saturation Ratios: 9 % — ABNORMAL LOW (ref 17.9–39.5)
TIBC: 363 ug/dL (ref 250–450)
UIBC: 331 ug/dL

## 2019-06-03 LAB — RETICULOCYTES
Immature Retic Fract: 6.6 % (ref 2.3–15.9)
RBC.: 3.41 MIL/uL — ABNORMAL LOW (ref 4.22–5.81)
Retic Count, Absolute: 33.4 10*3/uL (ref 19.0–186.0)
Retic Ct Pct: 1 % (ref 0.4–3.1)

## 2019-06-03 LAB — FOLATE
Folate: 9.4 ng/mL (ref >5.9)
Folate: 7 ng/mL (ref >5.9)

## 2019-06-05 LAB — LUPUS ANTICOAGULANT PANEL
DRVVT: 70.9 s — ABNORMAL HIGH (ref 0.0–47.0)
PTT Lupus Anticoagulant: 45.8 s (ref 0.0–51.9)

## 2019-06-05 LAB — CULTURE, BLOOD (ROUTINE X 2): Special Requests: ADEQUATE

## 2019-06-05 LAB — BETA-2-GLYCOPROTEIN I ABS, IGG/M/A
Beta-2 Glyco I IgG: <9 GPI IgG units (ref 0–20)
Beta-2-Glycoprotein I IgA: <9 GPI IgA units (ref 0–25)
Beta-2-Glycoprotein I IgM: <9 GPI IgM units (ref 0–32)

## 2019-06-05 LAB — CARDIOLIPIN ANTIBODIES, IGG, IGM, IGA
Anticardiolipin IgA: 13 APL U/mL — ABNORMAL HIGH (ref 0–11)
Anticardiolipin IgG: <9 GPL U/mL (ref 0–14)
Anticardiolipin IgM: 18 MPL U/mL — ABNORMAL HIGH (ref 0–12)

## 2019-06-05 LAB — DRVVT MIX: dRVVT Mix: 49 s — ABNORMAL HIGH (ref 0.0–40.4)

## 2019-06-05 LAB — DRVVT CONFIRM: dRVVT Confirm: 1.1 ratio (ref 0.8–1.2)

## 2019-06-06 LAB — CULTURE, BLOOD (ROUTINE X 2): Special Requests: ADEQUATE

## 2019-06-07 LAB — FACTOR 5 LEIDEN

## 2019-06-08 ENCOUNTER — Other Ambulatory Visit: Payer: Self-pay

## 2019-06-08 LAB — PROTHROMBIN GENE MUTATION

## 2019-06-08 NOTE — Progress Notes (Signed)
Tumor Board Documentation  Wyatt Montgomery was presented by Dr Merlene Pulling at our Tumor Board on 06/08/2019, which included representatives from medical oncology, radiation oncology, navigation, pathology, radiology, surgical, internal medicine, pharmacy, pulmonology, palliative care, research.  Wyatt Montgomery currently presents as a new patient, for discussion with history of the following treatments: active survellience.  Additionally, we reviewed previous medical and familial history, history of present illness, and recent lab results along with all available histopathologic and imaging studies. The tumor board considered available treatment options and made the following recommendations:   referral to [facility] for [facility] [place]  The following procedures/referrals were also placed: No orders of the defined types were placed in this encounter.   Clinical Trial Status: not discussed   Staging used: Not Applicable  National site-specific guidelines   were discussed with respect to the case.  Tumor board is a meeting of clinicians from various specialty areas who evaluate and discuss patients for whom a multidisciplinary approach is being considered. Final determinations in the plan of care are those of the provider(s). The responsibility for follow up of recommendations given during tumor board is that of the provider.   Today's extended care, comprehensive team conference, Wyatt Montgomery was not present for the discussion and was not examined.   Multidisciplinary Tumor Board is a multidisciplinary case peer review process.  Decisions discussed in the Multidisciplinary Tumor Board reflect the opinions of the specialists present at the conference without having examined the patient.  Ultimately, treatment and diagnostic decisions rest with the primary provider(s) and the patient.

## 2019-06-13 ENCOUNTER — Telehealth: Payer: Self-pay

## 2019-06-13 NOTE — Telephone Encounter (Signed)
Attempted to call Wyatt Montgomery to arrange EUS for 07/13/19 per Dr. Merlene Pulling. No answer/no voicemail. Will attempt again at a later date.

## 2019-06-26 ENCOUNTER — Telehealth: Payer: Self-pay

## 2019-06-26 NOTE — Telephone Encounter (Signed)
Call placed to Wyatt Montgomery to arrange EUS for 6/24. He is not in at the moment and person answering phone asked that I call back as she cannot write down phone number for returned call.

## 2019-06-27 ENCOUNTER — Other Ambulatory Visit: Payer: Self-pay

## 2019-06-27 ENCOUNTER — Telehealth: Payer: Self-pay

## 2019-06-27 NOTE — Telephone Encounter (Signed)
Called and spoke with Wyatt Montgomery regarding setting up biopsy of pancreas. He will be at Dr. Danton Sewer appointment on 6/14. Confirmed time as 1315. He had 0900 on his papers. I will meet with him at this appointment to go over all instructions. Informed him that we will plan biopsy for 6/24.

## 2019-06-30 NOTE — Progress Notes (Signed)
[facility]  [address] [place], [place] [address] Phone: [phone]  Fax: [phone]   Clinic Day:  07/03/2019  Referring physician: Lequita Asal, MD  Chief Complaint: Wyatt Montgomery is a 64 y.o. 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   HPI: The patient was last seen in the medical oncology clinic on 06/02/2019 for an initial consult after interval hospitalization. At that time, abdominal pain had resolved.  He was taking Eliquis twice a day. He denied any bleeding.  Weight was stable.  He was eating better.  Alcohol intake had decreased from 9 to 3 beers/day.  Work up included a hematocrit 29.5, hemoglobin 9.5, platelets 316,000, WBC 4,800.  Retic was 1.0%.  Ferritin was 42 with and iron saturation of 9% and a TIBC of 363.  Vitamin B12 was 315 with folate 7.0.  Hypercoagulable work-up revealed the following normal studies:  Factor V Leiden, prothrombin gene mutation, lupus anticoagulant panel, beta-2-glycoprotein (IgG <9, IgM <9, IgA <9), anti-cardiolipin antibodies (IgG <9, IgM 18, IgA 13).   Tumor Board on 06/08/2019 recommended endoscopic ultrasound to evaluate the pancreatic head mass.  Biopsy is planned on 07/13/2019.  During the interim, he has felt much better. His appetite is better. His diet consist of iron rich foods. He notes diarrhea 2 times per day. He denies any blood or mucus. Sometimes his stool is liquidly and other times it is a mixture of formed and liquid. He has not had any abdominal pain. Nausea and vomiting have resolved. He has cut back to 14 oz. of beer. He remains on Eliquis.   After reviewing labs, the patient agreed to starting oral iron with OJ or vitamin C.  Patient will be getting his COVID-19 vaccine later this month.    Past Medical History:  Diagnosis Date  . Pancreatic mass    . Thrombosis, portal vein     Past Surgical History:  Procedure Laterality Date  . ABDOMINAL SURGERY      History reviewed. No pertinent family history.  Social History:  reports that he has been smoking cigarettes. He has a 10.00 pack-year smoking history. He has never used smokeless tobacco. He reports current alcohol use of about 7.0 standard drinks of alcohol per week. He reports that he does not use drugs.  He cut down the amount of beers he drinks a day from 9 beers to 3 beers. The patient lives in [place]. He is married. The patient is accompanied by his friend Vaughan Basta today.  Allergies: No Known Allergies  Current Medications: Current Outpatient Medications  Medication Sig Dispense Refill  . apixaban (ELIQUIS) 5 MG TABS tablet Take 1 tablet (5 mg total) by mouth 2 (two) times daily. 60 tablet 3  . ibuprofen (ADVIL) 200 MG tablet Take 200 mg by mouth every 6 (six) hours as needed.    . Multiple Vitamin (MULTIVITAMIN WITH MINERALS) TABS tablet Take 1 tablet by mouth daily. 30 tablet 0  . vitamin B-12 (CYANOCOBALAMIN) 1000 MCG tablet Take 1,000 mcg by mouth daily.     No current facility-administered medications for this visit.    Review of Systems  Constitutional: Positive for weight loss (4 lbs). Negative for chills, diaphoresis, fever and malaise/fatigue.       Feeling much better.  HENT: Negative for congestion, ear discharge, ear pain, hearing loss, nosebleeds, sinus pain, sore  throat and tinnitus.   Eyes: Negative for blurred vision.  Respiratory: Negative for cough, hemoptysis, sputum production and shortness of breath.   Cardiovascular: Negative for chest pain, palpitations and leg swelling.  Gastrointestinal: Positive for diarrhea (x 2 per day, mixture of formed and liquid). Negative for abdominal pain (mild upper abdominal discomfort/tightness; improved), blood in stool, constipation, heartburn, melena, nausea (resolved) and vomiting (resolved).       Appetite has  improved.  Genitourinary: Negative for dysuria, flank pain, frequency, hematuria and urgency.  Musculoskeletal: Negative for back pain, joint pain, myalgias and neck pain.  Skin: Negative for itching and rash.  Neurological: Negative for dizziness, tingling, sensory change, weakness and headaches.  Endo/Heme/Allergies: Does not bruise/bleed easily.  Psychiatric/Behavioral: Negative for depression and memory loss. The patient is not nervous/anxious and does not have insomnia.   All other systems reviewed and are negative.  Performance status (ECOG): 0  Vitals Blood pressure (!) 144/70, pulse 89, temperature (!) 95.4 F (35.2 C), temperature source Tympanic, weight 134 lb 14.7 oz (61.2 kg), SpO2 100 %.   Physical Exam Vitals and nursing note reviewed.  Constitutional:      General: He is not in acute distress.    Appearance: Normal appearance.  HENT:     Head: Normocephalic and atraumatic.     Comments: Short dark hair. Mustache.    Mouth/Throat:     Mouth: Mucous membranes are moist.     Pharynx: Oropharynx is clear.  Eyes:     General: No scleral icterus.    Extraocular Movements: Extraocular movements intact.     Conjunctiva/sclera: Conjunctivae normal.     Pupils: Pupils are equal, round, and reactive to light.     Comments: Brown eyes.  Cardiovascular:     Rate and Rhythm: Normal rate and regular rhythm.     Pulses: Normal pulses.     Heart sounds: Normal heart sounds. No murmur heard.   Pulmonary:     Effort: Pulmonary effort is normal. No respiratory distress.     Breath sounds: Normal breath sounds. No wheezing or rales.  Chest:     Chest wall: No tenderness.  Abdominal:     General: Bowel sounds are normal.     Palpations: Abdomen is soft.  Musculoskeletal:        General: No swelling or tenderness. Normal range of motion.     Cervical back: Normal range of motion and neck supple.  Lymphadenopathy:     Head:     Right side of head: No preauricular, posterior  auricular or occipital adenopathy.     Left side of head: No preauricular, posterior auricular or occipital adenopathy.     Cervical: No cervical adenopathy.     Upper Body:     Right upper body: No supraclavicular adenopathy.     Left upper body: No supraclavicular adenopathy.     Lower Body: No right inguinal adenopathy. No left inguinal adenopathy.  Skin:    General: Skin is warm and dry.  Neurological:     Mental Status: He is alert and oriented to person, place, and time. Mental status is at baseline.  Psychiatric:        Mood and Affect: Mood normal.        Behavior: Behavior normal.        Thought Content: Thought content normal.        Judgment: Judgment normal.    No visits with results within 3 Day(s) from this visit.  Latest known  visit with results is:  Clinical Support on 06/02/2019  Component Date Value Ref Range Status  . Iron 06/02/2019 34* 45 - 182 ug/dL Final   PLEASE DISREGARD RESULTS  . TIBC 06/02/2019 SUBOPTIMAL SPECIMEN  250 - 450 ug/dL Final  . Saturation Ratios 06/02/2019 SUBOPTIMAL SPECIMEN  17.9 - 39.5 % Final  . UIBC 06/02/2019 SUBOPTIMAL SPECIMEN  ug/dL Final   Performed at [facility], [address] [place], [place] [address]  . Folate 06/02/2019 7.0  >5.9 ng/mL Final   Comment: PLEASE DISREGARD RESULTS Performed at [facility], [address] [place], [place] [address]   . Vitamin B-12 06/02/2019 315  180 - 914 pg/mL Final   Comment: (NOTE) This assay is not validated for testing neonatal or myeloproliferative syndrome specimens for Vitamin B12 levels. Performed at [facility], [address] [place], [place] [address]   . Ferritin 06/02/2019 46  24 - 336 ng/mL Final   Comment: SUBOPTIMAL SPECIMEN PLEASE DISREGARD RESULTS Performed at [facility], [address] [place], [place] [address]   . Beta-2 Glyco I IgG 06/02/2019 <9  0 - 20 GPI IgG units Final   Comment: (NOTE) The reference interval reflects a 3SD  or 99th percentile interval, which is thought to represent a potentially clinically significant result in accordance with the International Consensus Statement on the classification criteria for definitive antiphospholipid syndrome (APS). J Thromb Haem 2006;4:295-306.   . Beta-2-Glycoprotein I IgM 06/02/2019 <9  0 - 32 GPI IgM units Final   Comment: (NOTE) The reference interval reflects a 3SD or 99th percentile interval, which is thought to represent a potentially clinically significant result in accordance with the International Consensus Statement on the classification criteria for definitive antiphospholipid syndrome (APS). J Thromb Haem 2006;4:295-306. Performed At: [facility]  [address] [place], [place] [id number] Jolene SchimkeNagendra Sanjai MD MV:[id number]:[phone]   . Beta-2-Glycoprotein I IgA 06/02/2019 <9  0 - 25 GPI IgA units Final   Comment: (NOTE) The reference interval reflects a 3SD or 99th percentile interval, which is thought to represent a potentially clinically significant result in accordance with the International Consensus Statement on the classification criteria for definitive antiphospholipid syndrome (APS). J Thromb Haem 2006;4:295-306.   Marland Kitchen. Anticardiolipin IgG 06/02/2019 <9  0 - 14 GPL U/mL Final   Comment: (NOTE)                          Negative:              <15                          Indeterminate:     15 - 20                          Low-Med Positive: >20 - 80                          High Positive:         >80   . Anticardiolipin IgM 06/02/2019 18* 0 - 12 MPL U/mL Final   Comment: (NOTE)                          Negative:              <13  Indeterminate:     13 - 20                          Low-Med Positive: >20 - 80                          High Positive:         >80   . Anticardiolipin IgA 06/02/2019 13* 0 - 11 APL U/mL Final   Comment: (NOTE)                          Negative:              <12                           Indeterminate:     12 - 20                          Low-Med Positive: >20 - 80                          High Positive:         >80 Performed At: [facility] [address] [place], [place] [id number] Jolene Schimke MD NG:[id number]   . PTT Lupus Anticoagulant 06/02/2019 45.8  0.0 - 51.9 sec Final  . DRVVT 06/02/2019 70.9* 0.0 - 47.0 sec Final  . Lupus Anticoag Interp 06/02/2019 Comment:   Corrected   Comment: (NOTE) No lupus anticoagulant was detected. These results are consistent with specific inhibitors to one or more common pathway factors (X, V, II or fibrinogen). Performed At: [facility] [address] [place], [place] [id number] Jolene Schimke MD GU:[id number]   . Recommendations-PTGENE: 06/02/2019 Comment   Final   Comment: (NOTE) NEGATIVE No mutation identified. Comment: A point mutation (G20210A) in the factor II (prothrombin) gene is the second most common cause of inherited thrombophilia. The incidence of this mutation in the [place] Caucasian population is about 2% and in the [place] American population it is approximately 0.5%. This mutation is rare in the [place] and Native American population. Being heterozygous for a prothrombin mutation increases the risk for developing venous thrombosis about 2 to 3 times above the general population risk. Being homozygous for the prothrombin gene mutation increases the relative risk for venous thrombosis further, although it is not yet known how much further the risk is increased. In women heterozygous for the prothrombin gene mutation, the use of estrogen containing oral contraceptives increases the relative risk of venous thrombosis about 16 times and the risk of developing cerebral thrombosis is also significantly increased. In pregnancy the pr                          othrombin gene mutation increases risk for venous thrombosis and may increase risk for stillbirth, placental abruption, pre-eclampsia and  fetal growth restriction. If the patient possesses two or more congenital or acquired thrombophilic risk factors, the risk for thrombosis may rise to more than the sum of the risk ratios for the individual mutations. This assay detects only the prothrombin G20210A mutation and does not measure genetic abnormalities elsewhere in the genome. Other thrombotic risk factors may be pursued through systematic clinical laboratory analysis. These factors include the R506Q (Leiden)  mutation in the Factor V gene, plasma homocysteine levels, as well as testing for deficiencies of antithrombin III, protein C and protein S. Genetic Counselors are available for health care providers to discuss results at [phone] [phone]). Methodology: DNA analysis of the Factor II gene was performed by PCR amplification followed by restriction analysis. The di                          agnostic sensitivity is >99% for both. All the tests must be combined with clinical information for the most accurate interpretation. Molecular-based testing is highly accurate, but as in any laboratory test, diagnostic errors may occur. This test was developed and its performance characteristics determined by [facility]. It has not been cleared or approved by the Food and Drug Administration. Poort SR, et al. Blood. 1996; 11:9147-8295. Varga EA. Circulation. 2004; 110:e15-e18. Marland Kitchen, et al. Arterioscler Thromb Vasc Biol. [phone]. Vincenza Hews, PhD, [facility] Ernestene Mention, PhD, [facility] Gillian Shields, PhD, [facility] Manya Silvas, PhD, [facility] Bonnielee Haff, PhD, [facility] Alpha Gula, PhD, [facility] Performed At: [facility] [address] [place], [place] [id number] Maurine Simmering MDPhD LK:[id number]   . Recommendations-F5LEID: 06/02/2019 Comment   Final   Comment: (NOTE) Result:  Negative (no mutation found) Factor V Leiden is a specific mutation (R506Q) in the factor V gene that is associated with an  increased risk of venous thrombosis. Factor V Leiden is more resistant to inactivation by activated protein C.  As a result, factor V persists in the circulation leading to a mild hyper- coagulable state.  The Leiden mutation accounts for 90% - 95% of APC resistance.  Factor V Leiden has been reported in patients with deep vein thrombosis, pulmonary embolus, central retinal vein occlusion, cerebral sinus thrombosis and hepatic vein thrombosis. Other risk factors to be considered in the workup for venous thrombosis include the G20210A mutation in the factor II (prothrombin) gene, protein S and C deficiency, and antithrombin deficiencies. Anticardiolipin antibody and lupus anticoagulant analysis may be appropriate for certain patients, as well as homocysteine levels. Contact your local [facility] for information on how to order additi                          onal testing if desired. **Genetic counselors are available for health care providers to**  discuss results at [phone] [phone]). Methodology: DNA analysis of the Factor V gene was performed by allele-specific PCR. The diagnostic sensitivity and specificity is >99% for both. Molecular-based testing is highly accurate, but as in any laboratory test, diagnostic errors may occur. All test results must be combined with clinical information for the most accurate interpretation. This test was developed and its performance characteristics determined by [facility]. It has not been cleared or approved by the Food and Drug Administration. References: Voelkerding K (1996).  Clin Lab Med [phone]. Vincenza Hews, PhD, [facility] Ernestene Mention, PhD, [facility] Gillian Shields, PhD, [facility] Manya Silvas, PhD, [facility] Bonnielee Haff, PhD, [facility] Alpha Gula PhD, [facility] Performed At: [facility] [address] [place], [place] [id number] Maurine Simmering MDPhD PI:[id number]   . WBC 06/02/2019 4.8  4.0 - 10.5 K/uL Final  . RBC 06/02/2019 3.46*  4.22 - 5.81 MIL/uL Final  . Hemoglobin 06/02/2019 9.5* 13.0 - 17.0 g/dL Final  . HCT 63/01/6008 29.5* 39 - 52 % Final  . MCV 06/02/2019 85.3  80.0 - 100.0 fL Final  .  MCH 06/02/2019 27.5  26.0 - 34.0 pg Final  . MCHC 06/02/2019 32.2  30.0 - 36.0 g/dL Final  . RDW 16/10/9602 15.1  11.5 - 15.5 % Final  . Platelets 06/02/2019 316  150 - 400 K/uL Final  . nRBC 06/02/2019 0.0  0.0 - 0.2 % Final  . Neutrophils Relative % 06/02/2019 49  % Final  . Neutro Abs 06/02/2019 2.4  1.7 - 7.7 K/uL Final  . Lymphocytes Relative 06/02/2019 34  % Final  . Lymphs Abs 06/02/2019 1.6  0.7 - 4.0 K/uL Final  . Monocytes Relative 06/02/2019 14  % Final  . Monocytes Absolute 06/02/2019 0.7  0 - 1 K/uL Final  . Eosinophils Relative 06/02/2019 1  % Final  . Eosinophils Absolute 06/02/2019 0.1  0 - 0 K/uL Final  . Basophils Relative 06/02/2019 1  % Final  . Basophils Absolute 06/02/2019 0.1  0 - 0 K/uL Final  . Immature Granulocytes 06/02/2019 1  % Final  . Abs Immature Granulocytes 06/02/2019 0.04  0.00 - 0.07 K/uL Final   Performed at [facility], [address], [place], [place] [address]  . Iron 06/02/2019 32* 45 - 182 ug/dL Final  . TIBC 11/91/4782 363  250 - 450 ug/dL Final  . Saturation Ratios 06/02/2019 9* 17.9 - 39.5 % Final  . UIBC 06/02/2019 331  ug/dL Final   Performed at [facility], [address], [place], [place] [address]  . Ferritin 06/02/2019 42  24 - 336 ng/mL Final   Performed at [facility], [address], [place], [place] [address]  . Folate 06/02/2019 9.4  >5.9 ng/mL Final   Performed at [facility], [address], [place], [place] [address]  . Retic Ct Pct 06/02/2019 1.0  0.4 - 3.1 % Final  . RBC. 06/02/2019 3.41* 4.22 - 5.81 MIL/uL Final  . Retic Count, Absolute 06/02/2019 33.4  19.0 - 186.0 K/uL Final  . Immature Retic Fract 06/02/2019 6.6  2.3 - 15.9 % Final   Performed at [facility], [address], [place], [place] [address]  .  dRVVT Mix 06/02/2019 49.0* 0.0 - 40.4 sec Final   Comment: (NOTE) Performed At: [facility] [address] [place], [place] [id number] Jolene Schimke MD UU:[id number]   . dRVVT Confirm 06/02/2019 1.1  0.8 - 1.2 ratio Final   Comment: (NOTE) Performed At: [facility] [address] [place], [place] [id number] Jolene Schimke MD OV:[id number]     Assessment:  Wyatt Montgomery is a 64 y.o. 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  He is on Eliquis.  Abdominal MRI on 05/30/2019 revealed a 4.3 x 3.2 x 2.7 cm poorly defined masslike areain the inferior aspect of the pancreatic head. Region was heterogeneous but predominantly T1 isointense and T2 iso to hyperintense. There was severe ductal dilatation of the main pancreatic duct(up to 1.4 cm)in the body of the pancreas.There was possible direct invasion of the medial wall of the descending portion of the duodenum.There were abundant calcifications in this region. The possibility of acute on chronic pancreatitis resulting in this masslike appearance was not excluded. Underlying neoplasm was difficult to exclude.  CA19-9 was 132 (0-35) on 05/31/2019.   Work up on 06/02/2019 included a hematocrit 29.5, hemoglobin 9.5, platelets 316,000, WBC 4,800.  Retic was 1.0%.  Ferritin was 42 with and iron saturation  of 9% and a TIBC of 363.  Vitamin B12 was 315 with folate 7.0.  Hypercoagulable work-up was negative:  Factor V Leiden, prothrombin gene mutation, lupus anticoagulant panel, beta-2-glycoprotein (IgG <9, IgM <9, IgA <9), anti-cardiolipin antibodies (IgG <9, IgM 18, IgA 13).  He has iron deficiency.  Ferritin was 42 with an iron saturation of 9% on 06/02/2019.  He has B12 deficiency.  B12 was 315 on 06/02/2019.  He is on oral B12.  Folate is 7.0.  He plans to receive the COVID-19 vaccine later this month.    Symptomatically, he is eating and feeling better.  He has cut down on his alcohol intake.  He denies any bleeding.  Plan: 1.   Pancreatic head mass  Re-review concerns for possible pancreatic maligancy. Discuss plans for endoscopic ultrasound.   Patient in agreement.   Endoscopic ultrasound can be problematic given dense calcifications and limited ability to see the mass.  A negative EUS would not r/o a mass.   Discuss plans to hold Eliquis 2 days prior to the procedure.   Eliquis will be restarted per GI. 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  Hypercoagulable work-up negative: factor V Leiden, prothrombin gene mutation, lupus anticoagulant. He has no family history of thrombosis.  Refill Eliquis.  Continue Eliquis (except for planned procedure). 3.   Normocytic anemia  Etiology felt secondary to diet.  No bleeding.  He has a component of iron deficiency and B12 deficiency.  Continue oral B12.   Follow-up B12 level after 1 month on oral B12.  Begin ferrous sulfate 325 mg a day with OJ or vitamin C. 4.   Patient scheduled for endoscopic ultrasound on 07/13/2019. 5.   RTC 1 week after biopsy for MD assessment and discussion regarding direction of therapy.  I discussed the assessment and treatment plan with the patient.  The patient was provided an opportunity to ask questions and all were answered.  The patient agreed with the plan and demonstrated an understanding of the instructions.  The patient was advised to call back if the symptoms worsen or if the condition fails to improve as anticipated.  I provided 12 minutes of face-to-face time during this this encounter and > 50% was spent counseling as documented under my assessment and plan. An additional 5 minutes were spent reviewing his chart (Epic and Care Everywhere) including notes,  labs, and imaging studies.    Rosey Bath, MD, PhD    07/03/2019, 1:38 PM  I, Theador Hawthorne, am acting as scribe for [employer]. Merlene Pulling, MD, PhD.  I, Najeeb Uptain C. Merlene Pulling, MD, have reviewed the above documentation for accuracy and completeness, and I agree with the above.

## 2019-07-03 ENCOUNTER — Other Ambulatory Visit: Payer: Self-pay | Admitting: *Deleted

## 2019-07-03 ENCOUNTER — Other Ambulatory Visit: Payer: Self-pay

## 2019-07-03 ENCOUNTER — Inpatient Hospital Stay: Payer: Self-pay

## 2019-07-03 ENCOUNTER — Inpatient Hospital Stay: Payer: Self-pay | Attending: Hematology and Oncology | Admitting: Hematology and Oncology

## 2019-07-03 ENCOUNTER — Encounter: Payer: Self-pay | Admitting: Hematology and Oncology

## 2019-07-03 VITALS — BP 144/70 | HR 89 | Temp 95.4°F | Wt 134.9 lb

## 2019-07-03 DIAGNOSIS — D649 Anemia, unspecified: Secondary | ICD-10-CM

## 2019-07-03 DIAGNOSIS — I81 Portal vein thrombosis: Secondary | ICD-10-CM

## 2019-07-03 DIAGNOSIS — Z7901 Long term (current) use of anticoagulants: Secondary | ICD-10-CM | POA: Insufficient documentation

## 2019-07-03 DIAGNOSIS — F17210 Nicotine dependence, cigarettes, uncomplicated: Secondary | ICD-10-CM | POA: Insufficient documentation

## 2019-07-03 DIAGNOSIS — F1010 Alcohol abuse, uncomplicated: Secondary | ICD-10-CM | POA: Insufficient documentation

## 2019-07-03 DIAGNOSIS — K8689 Other specified diseases of pancreas: Secondary | ICD-10-CM

## 2019-07-03 DIAGNOSIS — E538 Deficiency of other specified B group vitamins: Secondary | ICD-10-CM | POA: Insufficient documentation

## 2019-07-03 DIAGNOSIS — K55069 Acute infarction of intestine, part and extent unspecified: Secondary | ICD-10-CM

## 2019-07-03 DIAGNOSIS — Z8719 Personal history of other diseases of the digestive system: Secondary | ICD-10-CM | POA: Insufficient documentation

## 2019-07-03 NOTE — Progress Notes (Signed)
Met with Wyatt Montgomery after appointment with Dr. Mike Gip. His friend, Wyatt Montgomery is also joining [place]. We went over all instructions for upcoming EUS that is scheduled for 6/24. These instructions can be located under the letters tab in epic. I have provided him 2 printed copies of these instructions. He has also been scheduled for a COVID-19 test on 6/22 in preparation for his procedure. He is currently taking Eliquis. He was instructed to take his Eliquis on 6/21 and then to stop. Dr. Francella Solian will instruct him when to start it back.

## 2019-07-04 ENCOUNTER — Other Ambulatory Visit: Payer: Self-pay

## 2019-07-05 ENCOUNTER — Inpatient Hospital Stay: Payer: Self-pay

## 2019-07-05 ENCOUNTER — Other Ambulatory Visit: Payer: Self-pay

## 2019-07-05 DIAGNOSIS — D649 Anemia, unspecified: Secondary | ICD-10-CM

## 2019-07-05 DIAGNOSIS — K8689 Other specified diseases of pancreas: Secondary | ICD-10-CM

## 2019-07-05 LAB — CBC WITH DIFFERENTIAL/PLATELET
Abs Immature Granulocytes: 0.01 10*3/uL (ref 0.00–0.07)
Basophils Absolute: 0 10*3/uL (ref 0.0–0.1)
Basophils Relative: 1 %
Eosinophils Absolute: 0.1 10*3/uL (ref 0.0–0.5)
Eosinophils Relative: 3 %
HCT: 28.8 % — ABNORMAL LOW (ref 39.0–52.0)
Hemoglobin: 9.3 g/dL — ABNORMAL LOW (ref 13.0–17.0)
Immature Granulocytes: 0 %
Lymphocytes Relative: 43 %
Lymphs Abs: 1.2 10*3/uL (ref 0.7–4.0)
MCH: 26.6 pg (ref 26.0–34.0)
MCHC: 32.3 g/dL (ref 30.0–36.0)
MCV: 82.3 fL (ref 80.0–100.0)
Monocytes Absolute: 0.4 10*3/uL (ref 0.1–1.0)
Monocytes Relative: 14 %
Neutro Abs: 1.1 10*3/uL — ABNORMAL LOW (ref 1.7–7.7)
Neutrophils Relative %: 39 %
Platelets: 266 10*3/uL (ref 150–400)
RBC: 3.5 MIL/uL — ABNORMAL LOW (ref 4.22–5.81)
RDW: 15.7 % — ABNORMAL HIGH (ref 11.5–15.5)
WBC: 2.9 10*3/uL — ABNORMAL LOW (ref 4.0–10.5)
nRBC: 0 % (ref 0.0–0.2)

## 2019-07-05 LAB — IRON AND TIBC
Iron: 25 ug/dL — ABNORMAL LOW (ref 45–182)
Saturation Ratios: 6 % — ABNORMAL LOW (ref 17.9–39.5)
TIBC: 396 ug/dL (ref 250–450)
UIBC: 371 ug/dL

## 2019-07-05 LAB — VITAMIN B12: Vitamin B-12: 2541 pg/mL — ABNORMAL HIGH (ref 180–914)

## 2019-07-05 LAB — FERRITIN: Ferritin: 16 ng/mL — ABNORMAL LOW (ref 24–336)

## 2019-07-06 ENCOUNTER — Telehealth: Payer: Self-pay

## 2019-07-06 NOTE — Telephone Encounter (Signed)
-----   Message from Lesle Chris, RN sent at 07/05/2019  4:57 PM EDT ----- Regarding: FW: Please call patient with ferritin level Attempted to call this patient. No answer, no voicemail set up ----- Message ----- From: Rosey Bath, MD Sent: 07/05/2019   4:33 PM EDT To: Lesle Chris, RN Subject: Please call patient with ferritin level         He is anemic.  Anemia is due to iron deficiency.  We discussed oral iron at last visit.  Ferritin goal is 100.  If he cannot tolerate oral iron or does not improve, we will start IV iron.  M ----- Message ----- From: Interface, Lab In [place] Sent: 07/05/2019  10:20 AM EDT To: Rosey Bath, MD

## 2019-07-06 NOTE — Telephone Encounter (Signed)
Attempted to call patient.  Someone answered the phone and said he is not there now but to call back around 4:00.

## 2019-07-06 NOTE — Telephone Encounter (Signed)
Patient notified of lab results and MD recommendation.  Called his friend Bonita Quin [phone]), at his request, to inform her of the medication he needs to start.

## 2019-07-11 ENCOUNTER — Other Ambulatory Visit: Payer: Self-pay

## 2019-07-11 ENCOUNTER — Other Ambulatory Visit
Admission: RE | Admit: 2019-07-11 | Discharge: 2019-07-11 | Disposition: A | Discharge status: Home / Self Care | Payer: HRSA Program | Source: Ambulatory Visit | Attending: Hematology and Oncology | Admitting: Hematology and Oncology

## 2019-07-11 DIAGNOSIS — Z20822 Contact with and (suspected) exposure to covid-19: Secondary | ICD-10-CM | POA: Diagnosis not present

## 2019-07-11 DIAGNOSIS — Z01812 Encounter for preprocedural laboratory examination: Secondary | ICD-10-CM | POA: Insufficient documentation

## 2019-07-11 LAB — SARS CORONAVIRUS 2 (TAT 6-24 HRS): SARS Coronavirus 2: NEGATIVE

## 2019-07-13 ENCOUNTER — Telehealth: Payer: Self-pay

## 2019-07-13 ENCOUNTER — Other Ambulatory Visit: Payer: Self-pay

## 2019-07-13 ENCOUNTER — Ambulatory Visit: Payer: Self-pay | Admitting: Anesthesiology

## 2019-07-13 ENCOUNTER — Encounter
Admission: RE | Disposition: A | Discharge status: Home / Self Care | Payer: Self-pay | Source: Home / Self Care | Attending: Gastroenterology

## 2019-07-13 ENCOUNTER — Ambulatory Visit
Admission: RE | Admit: 2019-07-13 | Discharge: 2019-07-13 | Disposition: A | Discharge status: Home / Self Care | Payer: Self-pay | Attending: Gastroenterology | Admitting: Gastroenterology

## 2019-07-13 ENCOUNTER — Encounter: Payer: Self-pay | Admitting: Gastroenterology

## 2019-07-13 DIAGNOSIS — K869 Disease of pancreas, unspecified: Secondary | ICD-10-CM | POA: Insufficient documentation

## 2019-07-13 DIAGNOSIS — Z538 Procedure and treatment not carried out for other reasons: Secondary | ICD-10-CM | POA: Insufficient documentation

## 2019-07-13 SURGERY — ULTRASOUND, UPPER GI TRACT, ENDOSCOPIC
Anesthesia: General

## 2019-07-13 NOTE — Telephone Encounter (Signed)
Notified by endoscopy that EUS was cancelled due to recent marijuana use and possible cocaine use. He was offered a urine drug screen by anesthesia but de declined and wanted to reschedule. Mr. Riedesel returned my call. Instructed him to resume his Eliquis today, 07/13/19. We will reschedule his EUS for 08/10/19. Reviewed instructions with him. These can be located under the letters tab. He is aware he will need to take his Eliquis on 7/19 and then hold dose on 7/20 and 7/21. COVID test rescheduled. At this request, I will also notify his friend, Bonita Quin with the new date and instructions. Copy of instructions mailed to his home address.

## 2019-07-13 NOTE — OR Nursing (Signed)
Patient admitted to Marijuana and Cocaine use within the past week.  Anesthesia offered pt to do UDS, he declined and wants to reschedule UDS.

## 2019-07-13 NOTE — Telephone Encounter (Signed)
Call placed to Mr. Wyatt Montgomery to reschedule EUS and instruct to resume Eliquis. Was unsuccessful at getting Mr. Wyatt Montgomery to the phone. Attempted to call back with no answer and no voicemail.

## 2019-07-13 NOTE — OR Nursing (Signed)
Wyatt Montgomery at the cancer center was notified of Patient's request to reschedule EUD tp a later date.

## 2019-07-13 NOTE — Anesthesia Preprocedure Evaluation (Signed)
Anesthesia Evaluation  Patient identified by MRN, date of birth, ID band Patient awake    Reviewed: Allergy & Precautions, H&P , NPO status , Patient's Chart, lab work & pertinent test results  History of Anesthesia Complications Negative for: history of anesthetic complications  Airway Mallampati: III  TM Distance: >3 FB Neck ROM: full    Dental  (+) Poor Dentition, Missing   Pulmonary COPD, Current Smoker and Patient abstained from smoking.,    Pulmonary exam normal        Cardiovascular Exercise Tolerance: Good (-) angina(-) Past MI and (-) DOE negative cardio ROS Normal cardiovascular exam     Neuro/Psych negative neurological ROS  negative psych ROS   GI/Hepatic negative GI ROS, (+)     substance abuse  cocaine use and marijuana use,   Endo/Other  negative endocrine ROS  Renal/GU negative Renal ROS  negative genitourinary   Musculoskeletal   Abdominal   Peds  Hematology negative hematology ROS (+)   Anesthesia Other Findings Past Medical History: No date: 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, portal vein  Past Surgical History: No date: ABDOMINAL SURGERY     Reproductive/Obstetrics negative OB ROS                             Anesthesia Physical Anesthesia Plan  ASA: III  Anesthesia Plan: General   Post-op Pain Management:    Induction: Intravenous  PONV Risk Score and Plan: Propofol infusion and TIVA  Airway Management Planned: Natural Airway and Nasal Cannula  Additional Equipment:   Intra-op Plan:   Post-operative Plan:   Informed Consent: I have reviewed the patients History and Physical, chart, labs and discussed the procedure including the risks, benefits and alternatives for the proposed anesthesia with the patient or authorized representative who has indicated his/her understanding and acceptance.     Dental Advisory Given  Plan Discussed with:  Anesthesiologist, CRNA and Surgeon  Anesthesia Plan Comments: (Patient reports using cocaine in the past couple of days.  Declinese UDS and would like to reschedule.  Patient consented for risks of anesthesia including but not limited to:  - adverse reactions to medications - risk of intubation if required - damage to eyes, teeth, lips or other oral mucosa - nerve damage due to positioning  - sore throat or hoarseness - Damage to heart, brain, nerves, lungs, other parts of body or loss of life  Patient voiced understanding.)        Anesthesia Quick Evaluation

## 2019-07-13 NOTE — Progress Notes (Signed)
Tumor Board Documentation  Wyatt Montgomery was presented by Dr Nanda Quinton at our Tumor Board on 07/13/2019, which included representatives from medical oncology, radiation oncology, surgical oncology, internal medicine, navigation, pathology, radiology, surgical, genetics, research.  Wyatt Montgomery currently presents as a current patient, for discussion with history of the following treatments: active survellience.  Additionally, we reviewed previous medical and familial history, history of present illness, and recent lab results along with all available histopathologic and imaging studies. The tumor board considered available treatment options and made the following recommendations: Biopsy (EUS)    The following procedures/referrals were also placed: No orders of the defined types were placed in this encounter.   Clinical Trial Status: not discussed   Staging used: Not Applicable, To be determined  National site-specific guidelines   were discussed with respect to the case.  Tumor board is a meeting of clinicians from various specialty areas who evaluate and discuss patients for whom a multidisciplinary approach is being considered. Final determinations in the plan of care are those of the provider(s). The responsibility for follow up of recommendations given during tumor board is that of the provider.   Today's extended care, comprehensive team conference, Wyatt Montgomery was not present for the discussion and was not examined.   Multidisciplinary Tumor Board is a multidisciplinary case peer review process.  Decisions discussed in the Multidisciplinary Tumor Board reflect the opinions of the specialists present at the conference without having examined the patient.  Ultimately, treatment and diagnostic decisions rest with the primary provider(s) and the patient.

## 2019-07-13 NOTE — Anesthesia Postprocedure Evaluation (Signed)
Anesthesia Post Note  Patient: Wyatt Montgomery  Procedure(s) Performed: FULL UPPER ENDOSCOPIC ULTRASOUND (EUS) RADIAL (N/A )  Patient location during evaluation: Endoscopy Anesthesia Type: General Level of consciousness: awake and alert Pain management: pain level controlled Vital Signs Assessment: post-procedure vital signs reviewed and stable Respiratory status: spontaneous breathing, nonlabored ventilation, respiratory function stable and patient connected to nasal cannula oxygen Cardiovascular status: blood pressure returned to baseline and stable Postop Assessment: no apparent nausea or vomiting Anesthetic complications: no   No complications documented.   Last Vitals: There were no vitals filed for this visit.  Last Pain: There were no vitals filed for this visit.               Cleda Mccreedy Neosha Switalski

## 2019-07-20 ENCOUNTER — Ambulatory Visit: Payer: Self-pay | Admitting: Hematology and Oncology

## 2019-08-01 ENCOUNTER — Other Ambulatory Visit: Payer: Self-pay

## 2019-08-08 ENCOUNTER — Other Ambulatory Visit
Admission: RE | Admit: 2019-08-08 | Discharge: 2019-08-08 | Disposition: A | Discharge status: Home / Self Care | Payer: HRSA Program | Source: Ambulatory Visit | Attending: Gastroenterology | Admitting: Gastroenterology

## 2019-08-08 ENCOUNTER — Other Ambulatory Visit: Payer: Self-pay

## 2019-08-08 DIAGNOSIS — Z20822 Contact with and (suspected) exposure to covid-19: Secondary | ICD-10-CM | POA: Insufficient documentation

## 2019-08-08 DIAGNOSIS — Z01812 Encounter for preprocedural laboratory examination: Secondary | ICD-10-CM | POA: Diagnosis present

## 2019-08-08 LAB — SARS CORONAVIRUS 2 (TAT 6-24 HRS): SARS Coronavirus 2: NEGATIVE

## 2019-08-09 ENCOUNTER — Encounter: Payer: Self-pay | Admitting: Gastroenterology

## 2019-08-10 ENCOUNTER — Encounter: Payer: Self-pay | Admitting: Gastroenterology

## 2019-08-10 ENCOUNTER — Ambulatory Visit: Payer: Self-pay | Admitting: Anesthesiology

## 2019-08-10 ENCOUNTER — Other Ambulatory Visit: Payer: Self-pay

## 2019-08-10 ENCOUNTER — Ambulatory Visit
Admission: RE | Admit: 2019-08-10 | Discharge: 2019-08-10 | Disposition: A | Discharge status: Home / Self Care | Payer: Self-pay | Attending: Gastroenterology | Admitting: Gastroenterology

## 2019-08-10 ENCOUNTER — Encounter
Admission: RE | Disposition: A | Discharge status: Home / Self Care | Payer: Self-pay | Source: Home / Self Care | Attending: Gastroenterology

## 2019-08-10 DIAGNOSIS — F17200 Nicotine dependence, unspecified, uncomplicated: Secondary | ICD-10-CM | POA: Insufficient documentation

## 2019-08-10 DIAGNOSIS — K861 Other chronic pancreatitis: Secondary | ICD-10-CM | POA: Insufficient documentation

## 2019-08-10 DIAGNOSIS — Z7901 Long term (current) use of anticoagulants: Secondary | ICD-10-CM | POA: Insufficient documentation

## 2019-08-10 DIAGNOSIS — K862 Cyst of pancreas: Secondary | ICD-10-CM | POA: Insufficient documentation

## 2019-08-10 HISTORY — PX: EUS: SHX5427

## 2019-08-10 LAB — URINE DRUG SCREEN, QUALITATIVE (ARMC ONLY)
Amphetamines, Ur Screen: NOT DETECTED
Barbiturates, Ur Screen: NOT DETECTED
Benzodiazepine, Ur Scrn: NOT DETECTED
Cannabinoid 50 Ng, Ur ~~LOC~~: NOT DETECTED
Cocaine Metabolite,Ur ~~LOC~~: NOT DETECTED
MDMA (Ecstasy)Ur Screen: NOT DETECTED
Methadone Scn, Ur: NOT DETECTED
Opiate, Ur Screen: NOT DETECTED
Phencyclidine (PCP) Ur S: NOT DETECTED
Tricyclic, Ur Screen: NOT DETECTED

## 2019-08-10 SURGERY — ULTRASOUND, UPPER GI TRACT, ENDOSCOPIC
Anesthesia: General

## 2019-08-10 MED ORDER — PROPOFOL 10 MG/ML IV BOLUS
INTRAVENOUS | Status: DC | PRN
  Administered 2019-08-10: 40 mg via INTRAVENOUS
  Administered 2019-08-10: 50 mg via INTRAVENOUS

## 2019-08-10 MED ORDER — PROPOFOL 500 MG/50ML IV EMUL
INTRAVENOUS | Status: DC | PRN
  Administered 2019-08-10: 125 ug/kg/min via INTRAVENOUS

## 2019-08-10 MED ORDER — PROPOFOL 10 MG/ML IV BOLUS
INTRAVENOUS | Status: AC
  Filled 2019-08-10: qty 20

## 2019-08-10 MED ORDER — SODIUM CHLORIDE 0.9 % IV SOLN: INTRAVENOUS | Status: DC

## 2019-08-10 MED ORDER — PROPOFOL 500 MG/50ML IV EMUL
INTRAVENOUS | Status: AC
  Filled 2019-08-10: qty 50

## 2019-08-10 MED ORDER — LIDOCAINE HCL (CARDIAC) PF 100 MG/5ML IV SOSY
PREFILLED_SYRINGE | INTRAVENOUS | Status: DC | PRN
  Administered 2019-08-10: 50 mg via INTRAVENOUS

## 2019-08-10 NOTE — Transfer of Care (Signed)
Immediate Anesthesia Transfer of Care Note  Patient: Wyatt Montgomery  Procedure(s) Performed: FULL UPPER ENDOSCOPIC ULTRASOUND (EUS) RADIAL (N/A )  Patient Location: PACU  Anesthesia Type:General  Level of Consciousness: awake, alert  and oriented  Airway & Oxygen Therapy: Patient Spontanous Breathing  Post-op Assessment: Report given to RN and Post -op Vital signs reviewed and stable  Post vital signs: Reviewed and stable  Last Vitals:  Vitals Value Taken Time  BP    Temp    Pulse 93 08/10/19 1422  Resp 18 08/10/19 1422  SpO2 100 % 08/10/19 1422  Vitals shown include unvalidated device data.  Last Pain:  Vitals:   08/10/19 1240  TempSrc: Temporal  PainSc: 0-No pain         Complications: No complications documented.

## 2019-08-10 NOTE — Anesthesia Preprocedure Evaluation (Addendum)
Anesthesia Evaluation  Patient identified by MRN, date of birth, ID band Patient awake    Reviewed: Allergy & Precautions, H&P , NPO status , reviewed documented beta blocker date and time   Airway Mallampati: III  TM Distance: >3 FB Neck ROM: full    Dental  (+) Edentulous Upper, Edentulous Lower   Pulmonary Current Smoker and Patient abstained from smoking.,    Pulmonary exam normal        Cardiovascular Normal cardiovascular exam     Neuro/Psych    GI/Hepatic neg GERD  ,  Endo/Other    Renal/GU      Musculoskeletal   Abdominal   Peds  Hematology   Anesthesia Other Findings Past Medical History: No date: 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 Negative UDS today.   Reproductive/Obstetrics                            Anesthesia Physical Anesthesia Plan  ASA: III  Anesthesia Plan: General   Post-op Pain Management:    Induction: Intravenous  PONV Risk Score and Plan: Treatment may vary due to age or medical condition and TIVA  Airway Management Planned: Nasal Cannula and Natural Airway  Additional Equipment:   Intra-op Plan:   Post-operative Plan:   Informed Consent: I have reviewed the patients History and Physical, chart, labs and discussed the procedure including the risks, benefits and alternatives for the proposed anesthesia with the patient or authorized representative who has indicated his/her understanding and acceptance.     Dental Advisory Given  Plan Discussed with: CRNA  Anesthesia Plan Comments:        Anesthesia Quick Evaluation

## 2019-08-10 NOTE — Op Note (Signed)
[facility] Gastroenterology Patient Name: Wyatt Montgomery Procedure Date: 08/10/2019 1:33 PM MRN: [id number] Account #: [id number] Date of Birth: 1955-06-02 Admit Type: Outpatient Age: 64 Room: [facility] ENDO ROOM 3 Gender: Male Note Status: Finalized Procedure:             Upper EUS Indications:           Suspected mass in pancreas on CT scan, Suspected mass                         in pancreas on MRI, Chronic pancreatitis Providers:             Emily Filbert Medicines:             Monitored Anesthesia Care Complications:         No immediate complications. Procedure:             Pre-Anesthesia Assessment:                        - Please see pre-anesthesia assessment documentation                         already completed in Epic.                        After obtaining informed consent, the endoscope was                         passed under direct vision. Throughout the procedure,                         the patient's blood pressure, pulse, and oxygen                         saturations were monitored continuously. The Endoscope                         was introduced through the mouth, and advanced to the                         second part of duodenum. The EUS scope was introduced                         through the mouth, and advanced to the duodenum for                         ultrasound examination from the stomach and duodenum.                         The upper EUS was accomplished without difficulty. The                         patient tolerated the procedure well. Findings:      ENDOSCOPIC FINDING: :      The examined esophagus was endoscopically normal.      The entire examined stomach was endoscopically normal.      There was evidence of a widely patent choledochoduodenostomy in the       duodenal bulb. This was characterized by healthy appearing mucosa.      ENDOSONOGRAPHIC  FINDING: :      Endosonographic imaging of the pancreas showed sonographic changes        indicative of moderate-severe chronic pancreatitis in the entire       pancreas. The parenchyma had hyperechoic foci with shadowing, cysts and       hyperechoic strands. The pancreatic duct had irregularity of contour and       duct dilation. The pancreatic duct measured up to 10 mm in diameter in       the body.      An anechoic lesion suggestive of a cyst was identified in the       peripancreatic region. The lesion measured 82 mm by 63 mm in maximal       cross-sectional diameter. There was a single compartment without septae.       The outer wall of the lesion was not seen. There was no associated mass.       There was internal debris within the fluid-filled cavity.      Biliary tree not well seen due to diffuse air in the bile duct.      No abnormal-appearing lymph nodes were seen during endosonographic       examination in the celiac region (level 20) and in the peripancreatic       region.      Endosonographic imaging in the visualized portion of the liver showed no       lesion. Impression:            - Normal esophagus.                        - Normal stomach.                        - Widely patent choledochoduodenostomy, characterized                         by healthy appearing mucosa was found.                        EUS:                        - Endosonographic imaging of the pancreas showed                         sonographic changes consistent with moderate-severe                         chronic pancreatitis. No focal mass seen but the                         sensitivity of EUS for pancreas masses is decreased in                         the setting of calcific pancreatitis.                        - A cystic lesion was seen in the peripancreatic                         region. Tissue has not been obtained. 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 This appears new compared to                          last CT in may 2021.                        - No specimens collected. Recommendation:        - Patient has a contact number available for                         emergencies. The signs and symptoms of potential                         delayed complications were discussed with the patient.                         Return to normal activities tomorrow. Written                         discharge instructions were provided to the patient.                        - Follow up CT or MRI to assess pancreatic head and                         the cyst - as he currently is completely asymptomatic                         suggest imaging in about 6 weeks but sooner if                         recurrent symptoms. He will follwo up with Dr.                         Merlene Pulling for management.                        - Return to referring physician.                        - The findings and recommendations were discussed with                         the patient.                        - The findings and recommendations were discussed with                         the referring physician. Procedure Code(s):     --- Professional ---                        [id number], Esophagogastroduodenoscopy, flexible,                         transoral; with endoscopic ultrasound examination  limited to the esophagus, stomach or duodenum, and                         adjacent structures Diagnosis Code(s):     --- Professional ---                        K86.1, Other chronic pancreatitis                        Z98.890, Other specified postprocedural states                        K86.2, Cyst of pancreas                        R93.3, Abnormal findings on diagnostic imaging of                         other parts of digestive tract CPT copyright 2019 [employer]. All rights reserved. The codes documented in this report are preliminary and upon coder review may  be revised to meet  current compliance requirements. Emily Filbert,  08/10/2019 2:47:08 PM This report has been signed electronically. Number of Addenda: 0 Note Initiated On: 08/10/2019 1:33 PM Estimated Blood Loss:  Estimated blood loss: none. Estimated blood loss: none.      [facility]

## 2019-08-10 NOTE — H&P (Signed)
Outpatient short stay form Pre-procedure 08/10/2019 1:26 PM Wyatt Montgomery,  [name], MD  Primary Physician: Rosey Bath, MD   Reason for visit:  No chief complaint on file.   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  Currently he is completely pain free eating normally and gaining some weight.    Current Facility-Administered Medications:  .  0.9 %  sodium chloride infusion, , Intravenous, Continuous, Akaiya Touchette, MD  Medications Prior to Admission  Medication Sig Dispense Refill Last Dose  . apixaban (ELIQUIS) 5 MG TABS tablet Take 1 tablet (5 mg total) by mouth 2 (two) times daily. 60 tablet 3 08/07/2019 at Unknown time  . ibuprofen (ADVIL) 200 MG tablet Take 200 mg by mouth every 6 (six) hours as needed.   Past Week at Unknown time  . Multiple Vitamin (MULTIVITAMIN WITH MINERALS) TABS tablet Take 1 tablet by mouth daily. 30 tablet 0 Past Week at Unknown time  . vitamin B-12 (CYANOCOBALAMIN) 1000 MCG tablet Take 1,000 mcg by mouth daily.   Past Week at Unknown time    No Known Allergies  Past Medical History:  Diagnosis Date  . Pancreatic mass   . Thrombosis, portal vein        Physical Exam     Pertinant exam for procedure: Abd soft ND/NT.      Planned proceedures: EUS. Discussed with patient and he agrees to proceed.    Emily Filbert MD Gastroenterology 08/10/2019  1:26 PM

## 2019-08-11 ENCOUNTER — Encounter: Payer: Self-pay | Admitting: Hematology and Oncology

## 2019-08-11 ENCOUNTER — Other Ambulatory Visit: Payer: Self-pay

## 2019-08-11 NOTE — Progress Notes (Signed)
No new changes noted today. The patient Name and DOB has been verified by phone today. 

## 2019-08-11 NOTE — Progress Notes (Signed)
[facility]  [address], [address] [place], [place] [address] Phone: [phone]  Fax: [phone]   Clinic Day:  08/14/2019  Referring physician: Rosey Bath, MD  Chief Complaint: Wyatt Montgomery is a 64 y.o. 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   HPI: The patient was last seen in the medical oncology clinic on 07/03/2019. At that time, he was eating and feeling better.  He had cut down on his alcohol intake.  He denied any bleeding. Hematocrit was 28.8, hemoglobin 9.3, MCV 82.3, platelets 266,000, WBC 2,900 (ANC 1,100). Ferritin was 16 with an iron saturation of 6% and a TIBC of 396. Vitamin B12 was 2,541. He began oral iron and restarted Eliquis.  Tumor Board recommendation on 07/13/2019 was biopsy (EUS).    Upper endoscopic ultrasound on 08/10/2019 by Dr. Rayann Heman revealed changes consistent with moderate-severe chronic pancreatitis. There was no focal mass seen, but the sensitivity of EUS for pancreas masses was decreased in the setting of calcific pancreatitis. There was a 8.2 x 6.3 cm cystic lesion seen in the peripancreatic region.  Tissue was not obtained.  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 This appeared new compared to the CT in 05/2019.  Recommendation was follow-up MRI in 6 weeks to follow-up on changes in the pancreatic head as well as the new cyst.  During the interim, he has been feeling "a whole lot better." He reports that he has been eating "a little bit" ,but his appetite is decreased.  He denies nausea, vomiting, and diarrhea. He reports that he moves his bowels at least 2x/day.  He denies blood in stool. He restarted Eliquis on the evening of 08/10/2019. He has decreased his alcohol intake to around 1 beer daily. He is taking oral B12 as prescribed.  He has not  received the COVID-19 vaccine.  The patient is agreeable to an MRI in 6 weeks.   Past Medical History:  Diagnosis Date  . Pancreatic mass   . Thrombosis, portal vein     Past Surgical History:  Procedure Laterality Date  . ABDOMINAL SURGERY    . EUS N/A 08/10/2019   Procedure: FULL UPPER ENDOSCOPIC ULTRASOUND (EUS) RADIAL;  Surgeon: Rayann Heman, MD;  Location: [facility] ENDOSCOPY;  Service: Endoscopy;  Laterality: N/A;    History reviewed. No pertinent family history.  Social History:  reports that he has been smoking cigarettes. He has smoked for the past 20.00 years. He has never used smokeless tobacco. He reports current alcohol use of about 7.0 standard drinks of alcohol per week. He reports current drug use. Drugs: Marijuana and Cocaine.  He cut down the amount of beers he drinks a day from 9 beers to 3 beers to 1 beers. The patient lives in [place]. He is married. The patient is alone today.  Allergies: No Known Allergies  Current Medications: Current Outpatient Medications  Medication Sig Dispense Refill  . apixaban (ELIQUIS) 5 MG TABS tablet Take 1 tablet (5 mg total) by mouth 2 (two) times daily. 60 tablet 3  . ibuprofen (ADVIL) 200 MG tablet Take 200 mg by mouth every 6 (six) hours as needed.    . Multiple Vitamin (MULTIVITAMIN WITH MINERALS) TABS tablet Take 1 tablet by mouth daily. 30 tablet 0  . vitamin B-12 (CYANOCOBALAMIN) 1000 MCG tablet Take 1,000 mcg by mouth daily.  No current facility-administered medications for this visit.    Review of Systems  Constitutional: Positive for weight loss (7 lbs). Negative for chills, diaphoresis, fever and malaise/fatigue.       Feeling "a whole lot better."  HENT: Negative for congestion, ear discharge, ear pain, hearing loss, nosebleeds, sinus pain, sore throat and tinnitus.   Eyes: Negative for blurred vision.  Respiratory: Negative for cough, hemoptysis, sputum production and shortness of breath.   Cardiovascular: Negative  for chest pain, palpitations and leg swelling.  Gastrointestinal: Negative for abdominal pain, blood in stool, constipation, diarrhea, heartburn, melena, nausea and vomiting.       Appetite has decreased. Eating "a little bit."  Genitourinary: Negative for dysuria, flank pain, frequency, hematuria and urgency.  Musculoskeletal: Negative for back pain, joint pain, myalgias and neck pain.  Skin: Negative for itching and rash.  Neurological: Negative for dizziness, tingling, sensory change, weakness and headaches.  Endo/Heme/Allergies: Does not bruise/bleed easily.  Psychiatric/Behavioral: Negative for depression and memory loss. The patient is not nervous/anxious and does not have insomnia.   All other systems reviewed and are negative.  Performance status (ECOG): 0  Vitals Blood pressure (!) 141/85, pulse 95, temperature 97.7 F (36.5 C), temperature source Tympanic, resp. rate 18, height 5\' 9"  (1.753 m), weight 127 lb 15.6 oz (58 kg), SpO2 100 %.   Physical Exam Vitals and nursing note reviewed.  Constitutional:      General: He is not in acute distress.    Appearance: Normal appearance. He is not diaphoretic.  HENT:     Head: Normocephalic and atraumatic.     Comments: Skull cap.  Short dark hair. Mustache.    Mouth/Throat:     Mouth: Mucous membranes are moist.     Pharynx: Oropharynx is clear.  Eyes:     General: No scleral icterus.    Extraocular Movements: Extraocular movements intact.     Conjunctiva/sclera: Conjunctivae normal.     Pupils: Pupils are equal, round, and reactive to light.     Comments: Brown eyes.  Cardiovascular:     Rate and Rhythm: Normal rate and regular rhythm.     Heart sounds: Normal heart sounds. No murmur heard.   Pulmonary:     Effort: Pulmonary effort is normal. No respiratory distress.     Breath sounds: Normal breath sounds. No wheezing or rales.  Chest:     Chest wall: No tenderness.  Abdominal:     General: Bowel sounds are normal.  There is no distension.     Palpations: Abdomen is soft. There is no mass.     Tenderness: There is no abdominal tenderness. There is no guarding or rebound.  Musculoskeletal:        General: No swelling or tenderness. Normal range of motion.     Cervical back: Normal range of motion and neck supple.  Lymphadenopathy:     Head:     Right side of head: No preauricular, posterior auricular or occipital adenopathy.     Left side of head: No preauricular, posterior auricular or occipital adenopathy.     Cervical: No cervical adenopathy.     Upper Body:     Right upper body: No supraclavicular or axillary adenopathy.     Left upper body: No supraclavicular or axillary adenopathy.     Lower Body: No right inguinal adenopathy. No left inguinal adenopathy.  Skin:    General: Skin is warm and dry.  Neurological:     Mental Status: He is  alert and oriented to person, place, and time. Mental status is at baseline.  Psychiatric:        Mood and Affect: Mood normal.        Behavior: Behavior normal.        Thought Content: Thought content normal.        Judgment: Judgment normal.    Appointment on 08/14/2019  Component Date Value Ref Range Status  . Folate 08/14/2019 6.6  >5.9 ng/mL Final   Performed at [facility], [address] [place] [place], [place] [address]  . Vitamin B-12 08/14/2019 880  180 - 914 pg/mL Final   Comment: (NOTE) This assay is not validated for testing neonatal or myeloproliferative syndrome specimens for Vitamin B12 levels. Performed at [facility], [address], [place], [place] [address]   . Sodium 08/14/2019 133* 135 - 145 mmol/L Final  . Potassium 08/14/2019 3.7  3.5 - 5.1 mmol/L Final  . Chloride 08/14/2019 102  98 - 111 mmol/L Final  . CO2 08/14/2019 22  22 - 32 mmol/L Final  . Glucose, Bld 08/14/2019 80  70 - 99 mg/dL Final   Glucose reference range applies only to samples taken after fasting for at least 8 hours.  . BUN 08/14/2019 16  8 - 23  mg/dL Final  . Creatinine, Ser 08/14/2019 1.22  0.61 - 1.24 mg/dL Final  . Calcium 02/18/4386 8.0* 8.9 - 10.3 mg/dL Final  . Total Protein 08/14/2019 6.9  6.5 - 8.1 g/dL Final  . Albumin 87/57/9728 3.2* 3.5 - 5.0 g/dL Final  . AST 20/60/1561 121* 15 - 41 U/L Final  . ALT 08/14/2019 31  0 - 44 U/L Final  . Alkaline Phosphatase 08/14/2019 207* 38 - 126 U/L Final  . Total Bilirubin 08/14/2019 0.7  0.3 - 1.2 mg/dL Final  . GFR calc non Af Amer 08/14/2019 >60  >60 mL/min Final  . GFR calc Af Amer 08/14/2019 >60  >60 mL/min Final  . Anion gap 08/14/2019 9  5 - 15 Final   Performed at [facility], [address], [place], [place] [address]  . CA 19-9 08/14/2019 31  0 - 35 U/mL Final   Comment: (NOTE) Roche [employer]ostics Electrochemiluminescence Immunoassay (ECLIA) Values obtained with different assay methods or kits cannot be used interchangeably.  Results cannot be interpreted as absolute evidence of the presence or absence of malignant disease. Performed At: [facility] [address] [place], [place] [id number] Jolene Schimke MD KH:[id number]   . Iron 08/14/2019 87  45 - 182 ug/dL Final  . TIBC 96/43/8381 288  250 - 450 ug/dL Final  . Saturation Ratios 08/14/2019 30  17.9 - 39.5 % Final  . UIBC 08/14/2019 201  ug/dL Final   Performed at [facility], [address], [place], [place] [address]  . Ferritin 08/14/2019 51  24 - 336 ng/mL Final   Performed at [facility], [address] [place] [place], [place] [address]  . WBC 08/14/2019 2.9* 4.0 - 10.5 K/uL Final  . RBC 08/14/2019 3.91* 4.22 - 5.81 MIL/uL Final  . Hemoglobin 08/14/2019 10.3* 13.0 - 17.0 g/dL Final  . HCT 67/70/3403 31.4* 39 - 52 % Final  . MCV 08/14/2019 80.3  80.0 - 100.0 fL Final  . MCH 08/14/2019 26.3  26.0 - 34.0 pg Final  . MCHC 08/14/2019 32.8  30.0 - 36.0 g/dL Final  . RDW 52/48/1859 18.6* 11.5 - 15.5 % Final  . Platelets 08/14/2019 275  150 -  400 K/uL Final  . nRBC 08/14/2019  0.0  0.0 - 0.2 % Final  . Neutrophils Relative % 08/14/2019 56  % Final  . Neutro Abs 08/14/2019 1.6* 1.7 - 7.7 K/uL Final  . Lymphocytes Relative 08/14/2019 24  % Final  . Lymphs Abs 08/14/2019 0.7  0.7 - 4.0 K/uL Final  . Monocytes Relative 08/14/2019 18  % Final  . Monocytes Absolute 08/14/2019 0.5  0 - 1 K/uL Final  . Eosinophils Relative 08/14/2019 0  % Final  . Eosinophils Absolute 08/14/2019 0.0  0 - 0 K/uL Final  . Basophils Relative 08/14/2019 2  % Final  . Basophils Absolute 08/14/2019 0.1  0 - 0 K/uL Final  . Immature Granulocytes 08/14/2019 0  % Final  . Abs Immature Granulocytes 08/14/2019 0.01  0.00 - 0.07 K/uL Final   Performed at [facility], [address] [place], [place] [id number]    Assessment:  Wyatt Montgomery is a 64 y.o. 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  He is on Eliquis.  Abdominal MRI on 05/30/2019 revealed a 4.3 x 3.2 x 2.7 cm poorly defined masslike areain the inferior aspect of the pancreatic head. Region was heterogeneous but predominantly T1 isointense and T2 iso to hyperintense. There was severe ductal dilatation of the main pancreatic duct(up to 1.4 cm)in the body of the pancreas.There was possible direct invasion of the medial wall of the descending portion of the duodenum.There were abundant calcifications in this region. The possibility of acute on chronic pancreatitis resulting in this masslike appearance was not excluded. Underlying neoplasm was difficult to exclude.    CA 19-9 has been followed (0-35): 132 on 05/31/2019 and 31 on 08/14/2019.    Work up on 06/02/2019 included a hematocrit 29.5, hemoglobin 9.5, platelets 316,000, WBC 4,800.  Retic was 1.0%.  Ferritin was 42 with and iron saturation of 9% and a TIBC of 363.  Vitamin B12 was 315 with folate 7.0.  Hypercoagulable work-up was negative:  Factor V  Leiden, prothrombin gene mutation, lupus anticoagulant panel, beta-2-glycoprotein (IgG <9, IgM <9, IgA <9), anti-cardiolipin antibodies (IgG <9, IgM 18, IgA 13).  Upper endoscopic ultrasound on 08/10/2019 revealed changes consistent with moderate-severe chronic pancreatitis. There was no focal mass seen, but the sensitivity of EUS for pancreas masses was decreased in the setting of calcific pancreatitis. There was a 8.2 x 6.3 cm cystic lesion seen in the peripancreatic region.  Tissue was not obtained.  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 This was new compared to the CT in 05/2019.    He has iron deficiency.  Ferritin was 42 with an iron saturation of 9% on 06/02/2019.    Ferritin has been followed: 42 on 06/02/2019, 16 on 07/05/2019 and 51 on 08/14/2019.    He has B12 deficiency.  B12 was 315 on 06/02/2019.  He is on oral B12.  Folate is 7.0.  He plans to receive the COVID-19 vaccine later this month.   Symptomatically, he feels much better.  He is eating less and has lost weight.  He denies any nausea, vomiting or diarrhea.  He denies any pain.  He is drinking 1 beer a day.  Plan: 1.   Labs today: CBC with differential, CMP, ferritin, iron studies, B12, folate, CA 19-9. 2.   Pancreatic head mass  Review interval endoscopic ultrasound.   Changes felt consistent with chronic  pancreatitis.   Sensitivity of EUS is decreased in the setting of calcific pancreatitis.   He has a large cystic lesion in the peripancreatic region suggestive of a pancreatic pseudocyst.   CA 19-9 is normal.   Discuss plans for follow-up abdominal MRI. 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  Hypercoagulable work-up negative: factor V Leiden, prothrombin gene mutation, lupus anticoagulant. He has no family history of thrombosis.  Continue Eliquis. 4.    Normocytic anemia  Hematocrit 28.8.  Hemoglobin   9.3.  MCV 82.3 on 07/05/2019.    Hematocrit 31.4.  Hemoglobin 10.3.  MCV 80.3 on 08/14/2019.     Ferritin 51 with an iron saturation of 30% and a TIBC of 288.   B12 is 880 and folate 6.6.  Medical is improving and felt secondary to improved diet.  He has a component of iron deficiency and B12 deficiency.  Continue oral B12.  Continue ferrous sulfate 325 mg p.o. daily with orange juice or vitamin C. 5.   Weight loss  Encourage oral intake.  Provide patient with Ensure/Boost. 6.   Abdomen MRI on 09/21/2019. 7.   RTC after abdomen MRI for MD assessment and review of imaging studies.  I discussed the assessment and treatment plan with the patient.  The patient was provided an opportunity to ask questions and all were answered.  The patient agreed with the plan and demonstrated an understanding of the instructions.  The patient was advised to call back if the symptoms worsen or if the condition fails to improve as anticipated.   Rosey Bath, MD, PhD    08/14/2019, 10:41 PM  I, Danella Penton Tufford, am acting as Neurosurgeon for [employer]. Merlene Pulling, MD, PhD.  I, Glenda Kunst C. Merlene Pulling, MD, have reviewed the above documentation for accuracy and completeness, and I agree with the above.

## 2019-08-14 ENCOUNTER — Encounter: Payer: Self-pay | Admitting: Hematology and Oncology

## 2019-08-14 ENCOUNTER — Other Ambulatory Visit: Payer: Self-pay

## 2019-08-14 ENCOUNTER — Inpatient Hospital Stay: Payer: Self-pay | Attending: Hematology and Oncology | Admitting: Hematology and Oncology

## 2019-08-14 ENCOUNTER — Inpatient Hospital Stay: Payer: Self-pay

## 2019-08-14 VITALS — BP 141/85 | HR 95 | Temp 97.7°F | Resp 18 | Ht 69.0 in | Wt 128.0 lb

## 2019-08-14 DIAGNOSIS — Z7901 Long term (current) use of anticoagulants: Secondary | ICD-10-CM | POA: Insufficient documentation

## 2019-08-14 DIAGNOSIS — K8689 Other specified diseases of pancreas: Secondary | ICD-10-CM

## 2019-08-14 DIAGNOSIS — K869 Disease of pancreas, unspecified: Secondary | ICD-10-CM | POA: Insufficient documentation

## 2019-08-14 DIAGNOSIS — D509 Iron deficiency anemia, unspecified: Secondary | ICD-10-CM

## 2019-08-14 DIAGNOSIS — D649 Anemia, unspecified: Secondary | ICD-10-CM

## 2019-08-14 DIAGNOSIS — E538 Deficiency of other specified B group vitamins: Secondary | ICD-10-CM | POA: Insufficient documentation

## 2019-08-14 DIAGNOSIS — K55069 Acute infarction of intestine, part and extent unspecified: Secondary | ICD-10-CM

## 2019-08-14 DIAGNOSIS — F17210 Nicotine dependence, cigarettes, uncomplicated: Secondary | ICD-10-CM | POA: Insufficient documentation

## 2019-08-14 DIAGNOSIS — I81 Portal vein thrombosis: Secondary | ICD-10-CM | POA: Insufficient documentation

## 2019-08-14 DIAGNOSIS — K860 Alcohol-induced chronic pancreatitis: Secondary | ICD-10-CM

## 2019-08-14 DIAGNOSIS — K862 Cyst of pancreas: Secondary | ICD-10-CM | POA: Insufficient documentation

## 2019-08-14 DIAGNOSIS — R634 Abnormal weight loss: Secondary | ICD-10-CM

## 2019-08-14 LAB — FOLATE: Folate: 6.6 ng/mL (ref >5.9)

## 2019-08-14 LAB — COMPREHENSIVE METABOLIC PANEL
ALT: 31 U/L (ref 0–44)
AST: 121 U/L — ABNORMAL HIGH (ref 15–41)
Albumin: 3.2 g/dL — ABNORMAL LOW (ref 3.5–5.0)
Alkaline Phosphatase: 207 U/L — ABNORMAL HIGH (ref 38–126)
Anion gap: 9 (ref 5–15)
BUN: 16 mg/dL (ref 8–23)
CO2: 22 mmol/L (ref 22–32)
Calcium: 8 mg/dL — ABNORMAL LOW (ref 8.9–10.3)
Chloride: 102 mmol/L (ref 98–111)
Creatinine, Ser: 1.22 mg/dL (ref 0.61–1.24)
GFR calc Af Amer: >60 mL/min (ref >60)
GFR calc non Af Amer: >60 mL/min (ref >60)
Glucose, Bld: 80 mg/dL (ref 70–99)
Potassium: 3.7 mmol/L (ref 3.5–5.1)
Sodium: 133 mmol/L — ABNORMAL LOW (ref 135–145)
Total Bilirubin: 0.7 mg/dL (ref 0.3–1.2)
Total Protein: 6.9 g/dL (ref 6.5–8.1)

## 2019-08-14 LAB — FERRITIN: Ferritin: 51 ng/mL (ref 24–336)

## 2019-08-14 LAB — CBC WITH DIFFERENTIAL/PLATELET
Abs Immature Granulocytes: 0.01 10*3/uL (ref 0.00–0.07)
Basophils Absolute: 0.1 10*3/uL (ref 0.0–0.1)
Basophils Relative: 2 %
Eosinophils Absolute: 0 10*3/uL (ref 0.0–0.5)
Eosinophils Relative: 0 %
HCT: 31.4 % — ABNORMAL LOW (ref 39.0–52.0)
Hemoglobin: 10.3 g/dL — ABNORMAL LOW (ref 13.0–17.0)
Immature Granulocytes: 0 %
Lymphocytes Relative: 24 %
Lymphs Abs: 0.7 10*3/uL (ref 0.7–4.0)
MCH: 26.3 pg (ref 26.0–34.0)
MCHC: 32.8 g/dL (ref 30.0–36.0)
MCV: 80.3 fL (ref 80.0–100.0)
Monocytes Absolute: 0.5 10*3/uL (ref 0.1–1.0)
Monocytes Relative: 18 %
Neutro Abs: 1.6 10*3/uL — ABNORMAL LOW (ref 1.7–7.7)
Neutrophils Relative %: 56 %
Platelets: 275 10*3/uL (ref 150–400)
RBC: 3.91 MIL/uL — ABNORMAL LOW (ref 4.22–5.81)
RDW: 18.6 % — ABNORMAL HIGH (ref 11.5–15.5)
WBC: 2.9 10*3/uL — ABNORMAL LOW (ref 4.0–10.5)
nRBC: 0 % (ref 0.0–0.2)

## 2019-08-14 LAB — IRON AND TIBC
Iron: 87 ug/dL (ref 45–182)
Saturation Ratios: 30 % (ref 17.9–39.5)
TIBC: 288 ug/dL (ref 250–450)
UIBC: 201 ug/dL

## 2019-08-15 ENCOUNTER — Telehealth: Payer: Self-pay

## 2019-08-15 LAB — VITAMIN B12: Vitamin B-12: 880 pg/mL (ref 180–914)

## 2019-08-15 LAB — CANCER ANTIGEN 19-9: CA 19-9: 31 U/mL (ref 0–35)

## 2019-08-15 NOTE — Telephone Encounter (Signed)
Spoke with the patient to inform him that his calcium was low and can he start oral calcium 600 mg 1 tab po daily. The patient states he will go out today and get the calcium. The patient is understanding and agreeable.

## 2019-08-17 NOTE — Anesthesia Postprocedure Evaluation (Signed)
Anesthesia Post Note  Patient: Wyatt Montgomery  Procedure(s) Performed: FULL UPPER ENDOSCOPIC ULTRASOUND (EUS) RADIAL (N/A )  Patient location during evaluation: Endoscopy Anesthesia Type: General Level of consciousness: awake and alert Pain management: pain level controlled Vital Signs Assessment: post-procedure vital signs reviewed and stable Respiratory status: spontaneous breathing, nonlabored ventilation and respiratory function stable Cardiovascular status: blood pressure returned to baseline and stable Postop Assessment: no apparent nausea or vomiting Anesthetic complications: no   No complications documented.   Last Vitals:  Vitals:   08/10/19 1240 08/10/19 1421  BP: (!) 131/82 (!) 100/58  Pulse: (!) 107 97  Resp: 17 15  Temp: (!) 36.3 C (!) 36.1 C  SpO2: 100% 100%    Last Pain:  Vitals:   08/10/19 1441  TempSrc:   PainSc: 0-No pain                 Christia Reading

## 2019-09-13 ENCOUNTER — Telehealth: Payer: Self-pay | Admitting: Pharmacist

## 2019-09-13 NOTE — Telephone Encounter (Signed)
Patient failed to provide requested 2021 financial documentation. No additional medication assistance will be provided by [facility] without the required proof of income documentation. Patient notified by letter Debra Cheek Administrative Assistant [facility] 

## 2019-09-20 ENCOUNTER — Ambulatory Visit: Payer: Self-pay

## 2019-09-26 ENCOUNTER — Inpatient Hospital Stay: Payer: Self-pay | Admitting: Hematology and Oncology

## 2019-10-10 ENCOUNTER — Ambulatory Visit
Admission: RE | Admit: 2019-10-10 | Discharge: 2019-10-10 | Disposition: A | Discharge status: Home / Self Care | Payer: Self-pay | Source: Ambulatory Visit | Attending: Hematology and Oncology | Admitting: Hematology and Oncology

## 2019-10-10 ENCOUNTER — Other Ambulatory Visit: Payer: Self-pay

## 2019-10-10 DIAGNOSIS — K862 Cyst of pancreas: Secondary | ICD-10-CM | POA: Insufficient documentation

## 2019-10-10 DIAGNOSIS — K8689 Other specified diseases of pancreas: Secondary | ICD-10-CM | POA: Insufficient documentation

## 2019-10-10 MED ORDER — GADOBUTROL 1 MMOL/ML IV SOLN
7.0000 mL | Freq: Once | INTRAVENOUS | Status: AC | PRN
  Administered 2019-10-10: 7 mL via INTRAVENOUS

## 2019-10-11 ENCOUNTER — Inpatient Hospital Stay: Payer: Self-pay | Attending: Hematology and Oncology | Admitting: Hematology and Oncology

## 2019-10-11 DIAGNOSIS — N1330 Unspecified hydronephrosis: Secondary | ICD-10-CM | POA: Insufficient documentation

## 2019-10-11 DIAGNOSIS — D649 Anemia, unspecified: Secondary | ICD-10-CM | POA: Insufficient documentation

## 2019-10-11 DIAGNOSIS — R635 Abnormal weight gain: Secondary | ICD-10-CM | POA: Insufficient documentation

## 2019-10-11 DIAGNOSIS — E538 Deficiency of other specified B group vitamins: Secondary | ICD-10-CM | POA: Insufficient documentation

## 2019-10-11 DIAGNOSIS — D72819 Decreased white blood cell count, unspecified: Secondary | ICD-10-CM | POA: Insufficient documentation

## 2019-10-11 DIAGNOSIS — R7989 Other specified abnormal findings of blood chemistry: Secondary | ICD-10-CM | POA: Insufficient documentation

## 2019-10-11 DIAGNOSIS — K869 Disease of pancreas, unspecified: Secondary | ICD-10-CM | POA: Insufficient documentation

## 2019-10-11 DIAGNOSIS — K55069 Acute infarction of intestine, part and extent unspecified: Secondary | ICD-10-CM | POA: Insufficient documentation

## 2019-10-11 DIAGNOSIS — Z7901 Long term (current) use of anticoagulants: Secondary | ICD-10-CM | POA: Insufficient documentation

## 2019-10-11 DIAGNOSIS — K861 Other chronic pancreatitis: Secondary | ICD-10-CM | POA: Insufficient documentation

## 2019-10-11 DIAGNOSIS — Z8719 Personal history of other diseases of the digestive system: Secondary | ICD-10-CM | POA: Insufficient documentation

## 2019-10-11 DIAGNOSIS — R634 Abnormal weight loss: Secondary | ICD-10-CM | POA: Insufficient documentation

## 2019-10-11 DIAGNOSIS — F17210 Nicotine dependence, cigarettes, uncomplicated: Secondary | ICD-10-CM | POA: Insufficient documentation

## 2019-10-14 NOTE — Progress Notes (Signed)
[facility]  [address], [address] [place], [place] [address] Phone: [phone]  Fax: [phone]   Clinic Day:  10/16/2019   Referring physician: Rosey Bathorcoran, Melissa C, MD  Chief Complaint: Wyatt CroakRobert M Montgomery is a 64 y.o. 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 At that time, he felt much better.  He was eating less and had lost weight.  He denied any nausea, vomiting or diarrhea.  He denied any pain.  He was drinking 1 beer a day.  Hematocrit was 31.4, hemoglobin 10.3, MCV 80.3, platelets 275,000, WBC 2900 with an ANC of 1600.  AST was 121, ALT 31, alkaline phosphatase 207 and bilirubin 0.7.  CA 19-9 was 31.  Ferritin was 51 with an iron saturation of 30% and a TIBC of 288.  B12 was 880 and folate 6.6.  Abdomen MRI on 10/10/2019 was substantially motion degraded scan, limiting assessment. There were stable findings of chronic pancreatitis. There was no discrete pancreatic mass, no peripancreatic fluid collections or cystic lesions. 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 There was high-grade venous narrowing at the confluence of the main portal, splenic and SMV without discrete residual thrombosis. There was chronic moderate right hydronephrosis with asymmetric right renal atrophy.  During the interim, the patient he felt "alright". He feels cold all the time. He reports having a good appetite but notes issues with gaining weight. He remains on Eliquis.   His BP is 162/96 today secondary to eat 4 pig's feet yesterday. He is drinking 1 beer a day. He had a few beers yesterday while watching a football game with friends. No beer today. He cut back to smoking 5-6 cigarettes a day.    Past Medical History:   Diagnosis Date  . Pancreatic mass   . Thrombosis, portal vein     Past Surgical History:  Procedure Laterality Date  . ABDOMINAL SURGERY    . EUS N/A 08/10/2019   Procedure: FULL UPPER ENDOSCOPIC ULTRASOUND (EUS) RADIAL;  Surgeon: Rayann HemanJowell, Paul, MD;  Location: [facility] ENDOSCOPY;  Service: Endoscopy;  Laterality: N/A;    No family history on file.  Social History:  reports that he has been smoking cigarettes. He has smoked for the past 20.00 years. He has never used smokeless tobacco. He reports current alcohol use of about 7.0 standard drinks of alcohol per week. He reports current drug use. Drugs: Marijuana and Cocaine. He cut back to smoking 5-6 cigarettes a day.  He cut down the amount of beers he drinks a day from 9 beers to 3 beers to 1 beers. The patient lives in [place]. He is married. The patient is alone today.   Allergies: No Known Allergies  Current Medications: Current Outpatient Medications  Medication Sig Dispense Refill  . apixaban (ELIQUIS) 5 MG TABS tablet Take 1 tablet (5 mg total) by mouth 2 (two) times daily. 60 tablet 3  . ibuprofen (ADVIL) 200 MG tablet Take 200 mg by mouth every 6 (six) hours as needed.    . Multiple Vitamin (MULTIVITAMIN WITH MINERALS) TABS tablet Take 1 tablet by mouth daily. 30 tablet 0  . vitamin B-12 (CYANOCOBALAMIN) 1000 MCG tablet Take 1,000 mcg by mouth daily.     No current facility-administered medications for this visit.  Review of Systems  Constitutional: Positive for weight loss (1 pound). Negative for chills, diaphoresis, fever and malaise/fatigue.       Feels "alright". Feels cold all the time.  HENT: Negative for congestion, ear discharge, ear pain, hearing loss, nosebleeds, sinus pain, sore throat and tinnitus.   Eyes: Negative for blurred vision.  Respiratory: Negative for cough, hemoptysis, sputum production and shortness of breath.   Cardiovascular: Negative for chest pain, palpitations and leg swelling.  Gastrointestinal:  Negative for abdominal pain, blood in stool, constipation, diarrhea, heartburn, melena, nausea and vomiting.       Good appetite.  Genitourinary: Negative for dysuria, flank pain, frequency, hematuria and urgency.  Musculoskeletal: Negative for back pain, joint pain, myalgias and neck pain.  Skin: Negative for itching and rash.  Neurological: Negative for dizziness, tingling, sensory change, weakness and headaches.  Endo/Heme/Allergies: Does not bruise/bleed easily.  Psychiatric/Behavioral: Negative for depression and memory loss. Substance abuse: 1 beer per day. The patient is not nervous/anxious and does not have insomnia.   All other systems reviewed and are negative.  Performance status (ECOG): 1  Vitals Blood pressure (!) 162/96, pulse 78, temperature 97.9 F (36.6 C), temperature source Tympanic, weight 126 lb 1.7 oz (57.2 kg), SpO2 100 %.   Physical Exam Vitals and nursing note reviewed.  Constitutional:      General: He is not in acute distress.    Appearance: Normal appearance. He is not diaphoretic.     Interventions: Face mask in place.  HENT:     Head: Normocephalic and atraumatic.     Comments: Skull cap.  Short dark hair. Mustache.    Mouth/Throat:     Mouth: Mucous membranes are moist.     Pharynx: Oropharynx is clear.  Eyes:     General: No scleral icterus.    Extraocular Movements: Extraocular movements intact.     Conjunctiva/sclera: Conjunctivae normal.     Pupils: Pupils are equal, round, and reactive to light.     Comments: Brown eyes.  Cardiovascular:     Rate and Rhythm: Normal rate and regular rhythm.     Heart sounds: Normal heart sounds. No murmur heard.   Pulmonary:     Effort: Pulmonary effort is normal. No respiratory distress.     Breath sounds: Normal breath sounds. No wheezing or rales.  Chest:     Chest wall: No tenderness.  Abdominal:     General: Bowel sounds are normal. There is no distension.     Palpations: Abdomen is soft. There is  no mass.     Tenderness: There is no abdominal tenderness. There is no guarding or rebound.  Musculoskeletal:        General: No swelling or tenderness. Normal range of motion.     Cervical back: Normal range of motion and neck supple.  Lymphadenopathy:     Head:     Right side of head: No preauricular, posterior auricular or occipital adenopathy.     Left side of head: No preauricular, posterior auricular or occipital adenopathy.     Cervical: No cervical adenopathy.     Upper Body:     Right upper body: No supraclavicular or axillary adenopathy.     Left upper body: No supraclavicular or axillary adenopathy.     Lower Body: No right inguinal adenopathy. No left inguinal adenopathy.  Skin:    General: Skin is warm and dry.  Neurological:     Mental Status: He is alert and oriented to person, place,  and time. Mental status is at baseline.  Psychiatric:        Mood and Affect: Mood normal.        Behavior: Behavior normal.        Thought Content: Thought content normal.        Judgment: Judgment normal.    No visits with results within 3 Day(s) from this visit.  Latest known visit with results is:  Appointment on 08/14/2019  Component Date Value Ref Range Status  . Folate 08/14/2019 6.6  >5.9 ng/mL Final   Performed at [facility], [address] [place] [place], [place] [address]  . Vitamin B-12 08/14/2019 880  180 - 914 pg/mL Final   Comment: (NOTE) This assay is not validated for testing neonatal or myeloproliferative syndrome specimens for Vitamin B12 levels. Performed at [facility], [address] [place], [place] [address]   . Sodium 08/14/2019 133* 135 - 145 mmol/L Final  . Potassium 08/14/2019 3.7  3.5 - 5.1 mmol/L Final  . Chloride 08/14/2019 102  98 - 111 mmol/L Final  . CO2 08/14/2019 22  22 - 32 mmol/L Final  . Glucose, Bld 08/14/2019 80  70 - 99 mg/dL Final   Glucose reference range applies only to samples taken after fasting for at least 8 hours.   . BUN 08/14/2019 16  8 - 23 mg/dL Final  . Creatinine, Ser 08/14/2019 1.22  0.61 - 1.24 mg/dL Final  . Calcium 27/74/1287 8.0* 8.9 - 10.3 mg/dL Final  . Total Protein 08/14/2019 6.9  6.5 - 8.1 g/dL Final  . Albumin 86/76/7209 3.2* 3.5 - 5.0 g/dL Final  . AST 47/09/6281 121* 15 - 41 U/L Final  . ALT 08/14/2019 31  0 - 44 U/L Final  . Alkaline Phosphatase 08/14/2019 207* 38 - 126 U/L Final  . Total Bilirubin 08/14/2019 0.7  0.3 - 1.2 mg/dL Final  . GFR calc non Af Amer 08/14/2019 >60  >60 mL/min Final  . GFR calc Af Amer 08/14/2019 >60  >60 mL/min Final  . Anion gap 08/14/2019 9  5 - 15 Final   Performed at [facility], [address], [place], [place] [address]  . CA 19-9 08/14/2019 31  0 - 35 U/mL Final   Comment: (NOTE) Roche [employer]ostics Electrochemiluminescence Immunoassay (ECLIA) Values obtained with different assay methods or kits cannot be used interchangeably.  Results cannot be interpreted as absolute evidence of the presence or absence of malignant disease. Performed At: [facility] [address] [place], [place] [id number] Jolene Schimke MD WS:[id number]   . Iron 08/14/2019 87  45 - 182 ug/dL Final  . TIBC 01/74/9449 288  250 - 450 ug/dL Final  . Saturation Ratios 08/14/2019 30  17.9 - 39.5 % Final  . UIBC 08/14/2019 201  ug/dL Final   Performed at [facility], [address] [place], [place] [address]  . Ferritin 08/14/2019 51  24 - 336 ng/mL Final   Performed at [facility], [address] [place] [place], [place] [address]  . WBC 08/14/2019 2.9* 4.0 - 10.5 K/uL Final  . RBC 08/14/2019 3.91* 4.22 - 5.81 MIL/uL Final  . Hemoglobin 08/14/2019 10.3* 13.0 - 17.0 g/dL Final  . HCT 65/99/3570 31.4* 39 - 52 % Final  . MCV 08/14/2019 80.3  80.0 - 100.0 fL Final  . MCH 08/14/2019 26.3  26.0 - 34.0 pg Final  . MCHC 08/14/2019 32.8  30.0 - 36.0 g/dL Final  . RDW 17/79/3903 18.6*  11.5 - 15.5 % Final  . Platelets 08/14/2019 275  150 - 400  K/uL Final  . nRBC 08/14/2019 0.0  0.0 - 0.2 % Final  . Neutrophils Relative % 08/14/2019 56  % Final  . Neutro Abs 08/14/2019 1.6* 1.7 - 7.7 K/uL Final  . Lymphocytes Relative 08/14/2019 24  % Final  . Lymphs Abs 08/14/2019 0.7  0.7 - 4.0 K/uL Final  . Monocytes Relative 08/14/2019 18  % Final  . Monocytes Absolute 08/14/2019 0.5  0 - 1 K/uL Final  . Eosinophils Relative 08/14/2019 0  % Final  . Eosinophils Absolute 08/14/2019 0.0  0 - 0 K/uL Final  . Basophils Relative 08/14/2019 2  % Final  . Basophils Absolute 08/14/2019 0.1  0 - 0 K/uL Final  . Immature Granulocytes 08/14/2019 0  % Final  . Abs Immature Granulocytes 08/14/2019 0.01  0.00 - 0.07 K/uL Final   Performed at [facility], [address] [place], [place] [address]    Assessment:  JAKORY MATSUO is a 64 y.o. 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  He is on Eliquis.  Abdominal MRI on 05/30/2019 revealed a 4.3 x 3.2 x 2.7 cm poorly defined masslike areain the inferior aspect of the pancreatic head. Region was heterogeneous but predominantly T1 isointense and T2 iso to hyperintense. There was severe ductal dilatation of the main pancreatic duct(up to 1.4 cm)in the body of the pancreas.There was possible direct invasion of the medial wall of the descending portion of the duodenum.There were abundant calcifications in this region. The possibility of acute on chronic pancreatitis resulting in this masslike appearance was not excluded. Underlying neoplasm was difficult to exclude.    CA 19-9 has been followed (0-35): 132 on 05/31/2019 and 31 on 08/14/2019.    Work up on 06/02/2019 included a hematocrit 29.5, hemoglobin 9.5, platelets 316,000, WBC 4,800.  Retic was 1.0%.  Ferritin was 42 with and iron saturation of 9% and a TIBC of 363.  Vitamin B12 was 315 with folate 7.0.  Hypercoagulable  work-up was negative:  Factor V Leiden, prothrombin gene mutation, lupus anticoagulant panel, beta-2-glycoprotein (IgG <9, IgM <9, IgA <9), anti-cardiolipin antibodies (IgG <9, IgM 18, IgA 13).  Upper endoscopic ultrasound on 08/10/2019 revealed changes consistent with moderate-severe chronic pancreatitis. There was no focal mass seen, but the sensitivity of EUS for pancreas masses was decreased in the setting of calcific pancreatitis. There was a 8.2 x 6.3 cm cystic lesion seen in the peripancreatic region.  Tissue was not obtained.  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 This was new compared to the CT in 05/2019.   Abdomen MRI on 10/10/2019 was substantially motion degraded scan, limiting assessment. There were stable findings of chronic pancreatitis. There was no discrete pancreatic mass, no peripancreatic fluid collections or cystic lesions. 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 There was high-grade venous narrowing at the confluence of the main portal, splenic and SMV without discrete residual thrombosis. There was chronic moderate right hydronephrosis with asymmetric right renal atrophy.   He has iron deficiency.  Ferritin was 42 with an iron saturation of 9% on 06/02/2019. Iron saturation was 30% on 08/14/2019.  Ferritin has been followed: 42 on 06/02/2019, 16 on 07/05/2019 and 51 on 08/14/2019.    He has B12 deficiency.  B12 was 880 and folate 6.6 on 08/14/2019.  He is on oral B12.   He plans to receive the COVID-19 vaccine later this month.   Symptomatically, he denies any complaint.  Exam is stable.  Plan: 1.   Labs today:  CBC with diff, copper, ANA, LDH, retic, hepatitis B core antibody total, hepatitis C antibody, HIV antibody, flow cytometry. 2.   Peripheral smear for path review. 3.   Pancreatic head mass  Endoscopic ultrasound on  07/2202021 felt c/w chronic pancreatitis.   Sensitivity of EUS is decreased in the setting of calcific pancreatitis.   He has a large cystic lesion in the peripancreatic region suggestive of a pancreatic pseudocyst.  CA 19-9 was 31 (normal) on 08/14/2019.  Abdominal MRI on 10/10/2019 was motion degraded.   Findings c/w chronic pancreatitis.  Anticipate follow-up CT scan. 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  Hypercoagulable work-up negative: factor V Leiden, prothrombin gene mutation, lupus anticoagulant. He has no family history of thrombosis.  Abdominal MRI on 10/10/2019 revealed narrowing at the  confluence of the main portal, splenic and SMV without discrete residual thrombosis.  Continue Eliquis. 5.   Normocytic anemia  Hematocrit 28.8.  Hemoglobin   9.3.  MCV 82.3 on 07/05/2019.    Hematocrit 31.4.  Hemoglobin 10.3.  MCV 80.3 on 08/14/2019.     Ferritin 51 with an iron saturation of 30% and a TIBC of 288.   B12 is 880 and folate 6.6.  Patient on oral B12 and ferrous sulfate 325 mg p.o. daily with orange juice or vitamin C. 6.   Leukopenia  Differential diagnosis is broad: medications, nutritional deficiencies, infection, autoimmune, rheumatologic, and hematologic disorders.  Encourage limited alcohol intake.  Discuss additional laboratory testing today.  Consider bone marrow aspirate and biopsy if etiology remains unknown. 7.   Weight loss  Weight loss 1 pound since last visit. 8.   MD to talk with radiology re: MRI. 9.   RTC in 2 weeks for MD assessment and review of additional work-up.  I discussed the assessment and treatment plan with the patient.  The patient was provided an opportunity to ask questions and all were answered.  The patient agreed with the plan and demonstrated an understanding of the instructions.  The patient was advised to call back if the symptoms worsen or if the condition fails to  improve as anticipated.    Rosey Bath, MD, PhD    10/16/2019 , 11:24 AM  I, Theador Hawthorne, am acting as scribe for [employer]. Merlene Pulling, MD, PhD.  I, Melissa C. Merlene Pulling, MD, have reviewed the above documentation for accuracy and completeness, and I agree with the above.

## 2019-10-16 ENCOUNTER — Inpatient Hospital Stay (HOSPITAL_BASED_OUTPATIENT_CLINIC_OR_DEPARTMENT_OTHER): Payer: Self-pay | Admitting: Hematology and Oncology

## 2019-10-16 ENCOUNTER — Encounter: Payer: Self-pay | Admitting: Hematology and Oncology

## 2019-10-16 ENCOUNTER — Other Ambulatory Visit: Payer: Self-pay

## 2019-10-16 ENCOUNTER — Inpatient Hospital Stay: Payer: Self-pay

## 2019-10-16 VITALS — BP 162/96 | HR 78 | Temp 97.9°F | Wt 126.1 lb

## 2019-10-16 DIAGNOSIS — D72819 Decreased white blood cell count, unspecified: Secondary | ICD-10-CM

## 2019-10-16 DIAGNOSIS — R7989 Other specified abnormal findings of blood chemistry: Secondary | ICD-10-CM

## 2019-10-16 DIAGNOSIS — I81 Portal vein thrombosis: Secondary | ICD-10-CM

## 2019-10-16 DIAGNOSIS — D649 Anemia, unspecified: Secondary | ICD-10-CM

## 2019-10-16 DIAGNOSIS — K8689 Other specified diseases of pancreas: Secondary | ICD-10-CM

## 2019-10-16 DIAGNOSIS — Z7189 Other specified counseling: Secondary | ICD-10-CM

## 2019-10-16 LAB — COMPREHENSIVE METABOLIC PANEL
ALT: 52 U/L — ABNORMAL HIGH (ref 0–44)
AST: 237 U/L — ABNORMAL HIGH (ref 15–41)
Albumin: 3.2 g/dL — ABNORMAL LOW (ref 3.5–5.0)
Alkaline Phosphatase: 154 U/L — ABNORMAL HIGH (ref 38–126)
Anion gap: 9 (ref 5–15)
BUN: 11 mg/dL (ref 8–23)
CO2: 23 mmol/L (ref 22–32)
Calcium: 7.9 mg/dL — ABNORMAL LOW (ref 8.9–10.3)
Chloride: 104 mmol/L (ref 98–111)
Creatinine, Ser: 1.03 mg/dL (ref 0.61–1.24)
GFR calc Af Amer: >60 mL/min (ref >60)
GFR calc non Af Amer: >60 mL/min (ref >60)
Glucose, Bld: 96 mg/dL (ref 70–99)
Potassium: 3.5 mmol/L (ref 3.5–5.1)
Sodium: 136 mmol/L (ref 135–145)
Total Bilirubin: 0.4 mg/dL (ref 0.3–1.2)
Total Protein: 6.9 g/dL (ref 6.5–8.1)

## 2019-10-16 LAB — RETICULOCYTES
Immature Retic Fract: 10.1 % (ref 2.3–15.9)
RBC.: 3.37 MIL/uL — ABNORMAL LOW (ref 4.22–5.81)
Retic Count, Absolute: 36.8 10*3/uL (ref 19.0–186.0)
Retic Ct Pct: 1.1 % (ref 0.4–3.1)

## 2019-10-16 LAB — HEPATITIS C ANTIBODY: HCV Ab: NONREACTIVE

## 2019-10-16 LAB — PATHOLOGIST SMEAR REVIEW

## 2019-10-16 LAB — HIV ANTIBODY (ROUTINE TESTING W REFLEX): HIV Screen 4th Generation wRfx: NONREACTIVE

## 2019-10-16 LAB — HEPATITIS B CORE ANTIBODY, TOTAL: Hep B Core Total Ab: NONREACTIVE

## 2019-10-16 LAB — LACTATE DEHYDROGENASE: LDH: 327 U/L — ABNORMAL HIGH (ref 98–192)

## 2019-10-16 NOTE — Progress Notes (Signed)
No new changes noted today 

## 2019-10-17 LAB — ANA W/REFLEX: Anti Nuclear Antibody (ANA): NEGATIVE

## 2019-10-18 LAB — COMP PANEL: LEUKEMIA/LYMPHOMA

## 2019-10-23 NOTE — Progress Notes (Signed)
This encounter was created in error - please disregard.

## 2019-10-26 NOTE — Progress Notes (Signed)
[facility]  [address], [address] [place], [place] [address] Phone: [phone]  Fax: [phone]   Clinic Day:  10/30/2019   Referring physician: Rosey Bathorcoran, Arleny Kruger C, MD  Chief Complaint: Wyatt CroakRobert M Montgomery is a 64 y.o. 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  HPI: The patient was last seen in the medical oncology clinic on 10/16/2019. At that time, he denied any complaints.  Exam was stable. Calcium was 7.9 with an albumin 3.2.  AST was 237, ALT 52, and alkaline phosphatase 154. Reticulocyte count was 1.1%. LDH was 327 (98-192). Additional work-up included negative hepatitis B core antibody, hepatitis C antibody, HIV antibody, and ANA.  He continued Eliquis, vitamin B12, and oral iron.  Peripheral smear revealed normal morphology of RBCs, WBCs, and platelets. Flow cytometry revealed no significant immunophenotypic abnormality.  During the interim, he has been good. His appetite is improving and he has been eating well. He denies nausea, vomiting, abdominal pain, or any other symptoms.   The patient ran out of his Eliquis yesterday. He tried to pick it up at [facility] in [place] but was told that they did not have any there.  He is drinking 1-2 beers per day.   Past Medical History:  Diagnosis Date  . Pancreatic mass   . Thrombosis, portal vein     Past Surgical History:  Procedure Laterality Date  . ABDOMINAL SURGERY    . EUS N/A 08/10/2019   Procedure: FULL UPPER ENDOSCOPIC ULTRASOUND (EUS) RADIAL;  Surgeon: Rayann HemanJowell, Paul, MD;  Location: [facility] ENDOSCOPY;  Service: Endoscopy;  Laterality: N/A;    History reviewed. No pertinent family history.  Social History:  reports that he has been smoking cigarettes. He has smoked for the past 20.00 years. He has never used smokeless tobacco. He reports current alcohol use of about 7.0 standard  drinks of alcohol per week. He reports current drug use. Drugs: Marijuana and Cocaine. He cut back to smoking 5-6 cigarettes a day.  He cut down the amount of beers he drinks a day from 9 beers to 3 beers to 1-2 beers. The patient lives in [place]. He is married. The patient is alone today.   Allergies: No Known Allergies  Current Medications: Current Outpatient Medications  Medication Sig Dispense Refill  . apixaban (ELIQUIS) 5 MG TABS tablet Take 1 tablet (5 mg total) by mouth 2 (two) times daily. 60 tablet 3  . ibuprofen (ADVIL) 200 MG tablet Take 200 mg by mouth every 6 (six) hours as needed.    . Multiple Vitamin (MULTIVITAMIN WITH MINERALS) TABS tablet Take 1 tablet by mouth daily. 30 tablet 0  . vitamin B-12 (CYANOCOBALAMIN) 1000 MCG tablet Take 1,000 mcg by mouth daily.     No current facility-administered medications for this visit.    Review of Systems  Constitutional: Negative for chills, diaphoresis, fever, malaise/fatigue and weight loss (stable).  HENT: Negative for congestion, ear discharge, ear pain, hearing loss, nosebleeds, sinus pain, sore throat and tinnitus.   Eyes: Negative for blurred vision.  Respiratory: Negative for cough, hemoptysis, sputum production and shortness of breath.   Cardiovascular: Negative for chest pain, palpitations and leg swelling.  Gastrointestinal: Negative for abdominal pain, blood in stool, constipation, diarrhea, heartburn, melena, nausea and vomiting.       Good appetite. Eating well.  Genitourinary: Negative for dysuria, flank pain, frequency, hematuria and urgency.  Musculoskeletal: Negative for back pain, joint pain, myalgias and neck pain.  Skin: Negative for itching and rash.  Neurological: Negative for dizziness, tingling, sensory change, weakness and headaches.  Endo/Heme/Allergies: Does not bruise/bleed easily.  Psychiatric/Behavioral: Positive for substance abuse (1-2 beers per day). Negative for depression and memory loss. The  patient is not nervous/anxious and does not have insomnia.   All other systems reviewed and are negative.  Performance status (ECOG): 0  Vitals Blood pressure (!) 170/86, pulse 89, temperature (!) 97.1 F (36.2 C), temperature source Tympanic, resp. rate 18, height 5\' 9"  (1.753 m), weight 127 lb 10.3 oz (57.9 kg), SpO2 100 %.   Physical Exam Vitals and nursing note reviewed.  Constitutional:      General: He is not in acute distress.    Appearance: Normal appearance. He is not diaphoretic.     Interventions: Face mask in place.  HENT:     Head: Normocephalic and atraumatic.     Comments: Skull cap.  Short dark hair. Mustache.    Mouth/Throat:     Mouth: Mucous membranes are moist.     Pharynx: Oropharynx is clear.  Eyes:     General: No scleral icterus.    Extraocular Movements: Extraocular movements intact.     Conjunctiva/sclera: Conjunctivae normal.     Pupils: Pupils are equal, round, and reactive to light.     Comments: Brown eyes.  Cardiovascular:     Rate and Rhythm: Normal rate and regular rhythm.     Heart sounds: Normal heart sounds. No murmur heard.   Pulmonary:     Effort: Pulmonary effort is normal. No respiratory distress.     Breath sounds: Normal breath sounds. No wheezing or rales.  Chest:     Chest wall: No tenderness.  Abdominal:     General: Bowel sounds are normal. There is no distension.     Palpations: Abdomen is soft. There is no mass.     Tenderness: There is no abdominal tenderness. There is no guarding or rebound.  Musculoskeletal:        General: No swelling or tenderness. Normal range of motion.     Cervical back: Normal range of motion and neck supple.  Lymphadenopathy:     Head:     Right side of head: No preauricular, posterior auricular or occipital adenopathy.     Left side of head: No preauricular, posterior auricular or occipital adenopathy.     Cervical: No cervical adenopathy.     Upper Body:     Right upper body: No  supraclavicular or axillary adenopathy.     Left upper body: No supraclavicular or axillary adenopathy.     Lower Body: No right inguinal adenopathy. No left inguinal adenopathy.  Skin:    General: Skin is warm and dry.  Neurological:     Mental Status: He is alert and oriented to person, place, and time. Mental status is at baseline.  Psychiatric:        Mood and Affect: Mood normal.        Behavior: Behavior normal.        Thought Content: Thought content normal.        Judgment: Judgment normal.    No visits with results within 3 Day(s) from this visit.  Latest known visit with results is:  Appointment on 10/16/2019  Component Date Value Ref Range Status  . Retic Ct Pct 10/16/2019 1.1  0.4 - 3.1 % Final  . RBC. 10/16/2019 3.37* 4.22 -  5.81 MIL/uL Final  . Retic Count, Absolute 10/16/2019 36.8  19.0 - 186.0 K/uL Final  . Immature Retic Fract 10/16/2019 10.1  2.3 - 15.9 % Final   Performed at [facility], [address] [place] [place], [place] [address]    Assessment:  Wyatt Montgomery is a 64 y.o. 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  He is on Eliquis.  Abdominal MRI on 05/30/2019 revealed a 4.3 x 3.2 x 2.7 cm poorly defined masslike areain the inferior aspect of the pancreatic head. Region was heterogeneous but predominantly T1 isointense and T2 iso to hyperintense. There was severe ductal dilatation of the main pancreatic duct(up to 1.4 cm)in the body of the pancreas.There was possible direct invasion of the medial wall of the descending portion of the duodenum.There were abundant calcifications in this region. The possibility of acute on chronic pancreatitis resulting in this masslike appearance was not excluded. Underlying neoplasm was difficult to exclude.    CA 19-9 has been followed (0-35): 132 on 05/31/2019 and 31 on 08/14/2019.    Work up on  06/02/2019 included a hematocrit 29.5, hemoglobin 9.5, platelets 316,000, WBC 4,800.  Retic was 1.0%.  Ferritin was 42 with and iron saturation of 9% and a TIBC of 363.  Vitamin B12 was 315 with folate 7.0.  Hypercoagulable work-up was negative:  Factor V Leiden, prothrombin gene mutation, lupus anticoagulant panel, beta-2-glycoprotein (IgG <9, IgM <9, IgA <9), anti-cardiolipin antibodies (IgG <9, IgM 18, IgA 13).  Upper endoscopic ultrasound on 08/10/2019 revealed changes consistent with moderate-severe chronic pancreatitis. There was no focal mass seen, but the sensitivity of EUS for pancreas masses was decreased in the setting of calcific pancreatitis. There was a 8.2 x 6.3 cm cystic lesion seen in the peripancreatic region.  Tissue was not obtained.  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 This was new compared to the CT in 05/2019.   Abdomen MRI on 10/10/2019 was substantially motion degraded scan, limiting assessment. There were stable findings of chronic pancreatitis. There was no discrete pancreatic mass, no peripancreatic fluid collections or cystic lesions. 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 There was high-grade venous narrowing at the confluence of the main portal, splenic and SMV without discrete residual thrombosis. There was chronic moderate right hydronephrosis with asymmetric right renal atrophy.   He has iron deficiency.  Ferritin was 42 with an iron saturation of 9% on 06/02/2019. Iron saturation was 30% on 08/14/2019.  Ferritin has been followed: 42 on 06/02/2019, 16 on 07/05/2019 and 51 on 08/14/2019.    He has B12 deficiency.  B12 was 880 and folate 6.6 on 08/14/2019.  He is on oral B12.   He has leukopenia and anemia.  Labs on 10/16/2019 revealed a reticulocyte count  of 1.1%. LDH was 327 (98-192). Hepatitis B core antibody, hepatitis C antibody, HIV  antibody, and ANA were negative.  Flow cytometry was negative.  Peripheral smear revealed no abnormalities.   He plans to receive the COVID-19 vaccine later this month.   Symptomatically, he feels good. His appetite is improving; he has been eating well. He denies nausea, vomiting, abdominal pain, or any other symptoms.  Exam is stable.  Plan: 1.   Labs today: CBC with diff, CMP. 2.   Pancreatic head mass  Endoscopic ultrasound on 07/2202021 felt c/w chronic  pancreatitis.   Sensitivity of EUS is decreased in the setting of calcific pancreatitis.   He has a large cystic lesion in the peripancreatic region suggestive of a pancreatic pseudocyst.  CA 19-9 was 31 (normal) on 08/14/2019.  Abdominal MRI on 10/10/2019 was motion degraded.   Findings c/w chronic pancreatitis.  Discuss GI consult.  Patient in agreement.  Discuss plans for follow-up MRI in 4 months. 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  Hypercoagulable work-up negative: factor V Leiden, prothrombin gene mutation, lupus anticoagulant. He has no family history of thrombosis.  Abdominal MRI on 10/10/2019 revealed narrowing at the confluence of the main portal, splenic and SMV without discrete residual thrombosis.  Refill Eliquis (dispense #60 with 3 refills). 4.   Normocytic anemia  Hematocrit 28.8.  Hemoglobin   9.3.  MCV 82.3 on 07/05/2019.    Hematocrit 31.4.  Hemoglobin 10.3.  MCV 80.3 on 08/14/2019.     Ferritin 51 with an iron saturation of 30% and a TIBC of 288.   B12 is 880 and folate 6.6.   Hematocrit 30.1.  Hemoglobin 9.6.  MCV 83.8 on 10/30/2019.  He is eating well.  He denies any bleeding.    Patient remains on oral B12 and ferrous sulfate 325 mg p.o. daily with orange juice or vitamin C. 5.   Leukopenia  Differential diagnosis is broad: medications, nutritional deficiencies, infection, autoimmune, rheumatologic, and hematologic  disorders.  Patient drinking 1-2 beers a day.  Encourage decreased alcohol intake.  Work-up is negative.  Review plans for bone marrow aspirate and biopsy if counts do not improve. 6.   Weight loss  Weight is stable.  Continue to monitor  7.   Consult GI (Dr Servando Snare). 8.   RTC in 1 month for MD assessment and labs (CBC with diff, LFTs).  I discussed the assessment and treatment plan with the patient.  The patient was provided an opportunity to ask questions and all were answered.  The patient agreed with the plan and demonstrated an understanding of the instructions.  The patient was advised to call back if the symptoms worsen or if the condition fails to improve as anticipated.    Rosey Bath, MD, PhD    10/30/2019 , 1:21 PM  I, Danella Penton Tufford, am acting as Neurosurgeon for [employer]. Merlene Pulling, MD, PhD.  I, Bless Belshe C. Merlene Pulling, MD, have reviewed the above documentation for accuracy and completeness, and I agree with the above.

## 2019-10-30 ENCOUNTER — Inpatient Hospital Stay: Payer: Self-pay | Attending: Hematology and Oncology | Admitting: Hematology and Oncology

## 2019-10-30 ENCOUNTER — Other Ambulatory Visit: Payer: Self-pay

## 2019-10-30 ENCOUNTER — Encounter: Payer: Self-pay | Admitting: Hematology and Oncology

## 2019-10-30 VITALS — BP 170/86 | HR 89 | Temp 97.1°F | Resp 18 | Ht 69.0 in | Wt 127.6 lb

## 2019-10-30 DIAGNOSIS — K55069 Acute infarction of intestine, part and extent unspecified: Secondary | ICD-10-CM

## 2019-10-30 DIAGNOSIS — R634 Abnormal weight loss: Secondary | ICD-10-CM

## 2019-10-30 DIAGNOSIS — R7989 Other specified abnormal findings of blood chemistry: Secondary | ICD-10-CM

## 2019-10-30 DIAGNOSIS — D649 Anemia, unspecified: Secondary | ICD-10-CM | POA: Insufficient documentation

## 2019-10-30 DIAGNOSIS — Z7901 Long term (current) use of anticoagulants: Secondary | ICD-10-CM | POA: Insufficient documentation

## 2019-10-30 DIAGNOSIS — K8689 Other specified diseases of pancreas: Secondary | ICD-10-CM | POA: Insufficient documentation

## 2019-10-30 DIAGNOSIS — E538 Deficiency of other specified B group vitamins: Secondary | ICD-10-CM | POA: Insufficient documentation

## 2019-10-30 DIAGNOSIS — Z7189 Other specified counseling: Secondary | ICD-10-CM

## 2019-10-30 DIAGNOSIS — Z79899 Other long term (current) drug therapy: Secondary | ICD-10-CM | POA: Insufficient documentation

## 2019-10-30 DIAGNOSIS — F17210 Nicotine dependence, cigarettes, uncomplicated: Secondary | ICD-10-CM | POA: Insufficient documentation

## 2019-10-30 DIAGNOSIS — I81 Portal vein thrombosis: Secondary | ICD-10-CM

## 2019-10-30 DIAGNOSIS — D72819 Decreased white blood cell count, unspecified: Secondary | ICD-10-CM

## 2019-10-30 DIAGNOSIS — K862 Cyst of pancreas: Secondary | ICD-10-CM

## 2019-10-30 LAB — COMPREHENSIVE METABOLIC PANEL
ALT: 41 U/L (ref 0–44)
AST: 122 U/L — ABNORMAL HIGH (ref 15–41)
Albumin: 3.3 g/dL — ABNORMAL LOW (ref 3.5–5.0)
Alkaline Phosphatase: 146 U/L — ABNORMAL HIGH (ref 38–126)
Anion gap: 7 (ref 5–15)
BUN: 13 mg/dL (ref 8–23)
CO2: 25 mmol/L (ref 22–32)
Calcium: 8.2 mg/dL — ABNORMAL LOW (ref 8.9–10.3)
Chloride: 101 mmol/L (ref 98–111)
Creatinine, Ser: 0.97 mg/dL (ref 0.61–1.24)
GFR, Estimated: >60 mL/min (ref >60)
Glucose, Bld: 94 mg/dL (ref 70–99)
Potassium: 3.5 mmol/L (ref 3.5–5.1)
Sodium: 133 mmol/L — ABNORMAL LOW (ref 135–145)
Total Bilirubin: 0.5 mg/dL (ref 0.3–1.2)
Total Protein: 6.8 g/dL (ref 6.5–8.1)

## 2019-10-30 LAB — CBC WITH DIFFERENTIAL/PLATELET
Abs Immature Granulocytes: 0.01 10*3/uL (ref 0.00–0.07)
Basophils Absolute: 0 10*3/uL (ref 0.0–0.1)
Basophils Relative: 1 %
Eosinophils Absolute: 0 10*3/uL (ref 0.0–0.5)
Eosinophils Relative: 1 %
HCT: 30.1 % — ABNORMAL LOW (ref 39.0–52.0)
Hemoglobin: 9.6 g/dL — ABNORMAL LOW (ref 13.0–17.0)
Immature Granulocytes: 0 %
Lymphocytes Relative: 31 %
Lymphs Abs: 0.9 10*3/uL (ref 0.7–4.0)
MCH: 26.7 pg (ref 26.0–34.0)
MCHC: 31.9 g/dL (ref 30.0–36.0)
MCV: 83.8 fL (ref 80.0–100.0)
Monocytes Absolute: 0.4 10*3/uL (ref 0.1–1.0)
Monocytes Relative: 15 %
Neutro Abs: 1.6 10*3/uL — ABNORMAL LOW (ref 1.7–7.7)
Neutrophils Relative %: 52 %
Platelets: 210 10*3/uL (ref 150–400)
RBC: 3.59 MIL/uL — ABNORMAL LOW (ref 4.22–5.81)
RDW: 18 % — ABNORMAL HIGH (ref 11.5–15.5)
WBC: 3 10*3/uL — ABNORMAL LOW (ref 4.0–10.5)
nRBC: 0 % (ref 0.0–0.2)

## 2019-10-30 MED ORDER — APIXABAN 5 MG PO TABS: 5.0000 mg | ORAL_TABLET | Freq: Two times a day (BID) | ORAL | 3 refills | Status: DC

## 2019-10-30 NOTE — Progress Notes (Signed)
No new changes noted today 

## 2019-11-27 NOTE — Progress Notes (Signed)
[facility]  [address] [place], [place] [address] Phone: [phone]  Fax: [phone]   Clinic Day:  11/28/2019   Referring physician: Rosey Bath, MD  Chief Complaint: Wyatt Montgomery is a 64 y.o. 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  HPI: The patient was last seen in the medical oncology clinic on 10/30/2019. At that time, he felt good. His appetite was improving; he had been eating well. He denied nausea, vomiting, abdominal pain, or any other symptoms.  Exam was stable. Hematocrit was 30.1, hemoglobin 9.6, MCV 83.8, platelets 210,000, WBC 3,000 (ANC 1,600). Sodium was 133. Calcium was 8.2 and albumin 3.3. AST was 122. Alkaline phosphatase was 146. He continued oral iron and vitamin B12.  We discussed a GI consult (appointment on 12/27/2019).  During the interim, he has done well except for the past 2 days.  He states that he "hasn't felt good".  Appetite has been poor.  He denies any nausea, vomiting or diarrhea.  He notes being lightheaded and dizzy.  The side of his head is "sore".  He has felt "achy all over and weak".  He denies any fever but notes chills.  He denies any cough, shortness of breath or chest pain.  He has a runny nose (new).  He denies any known exposure to COVID-19 or influenza.  He has been drinking little (1 beer yesterday and 1-2 last weekend).   Past Medical History:  Diagnosis Date  . Pancreatic mass   . Thrombosis, portal vein     Past Surgical History:  Procedure Laterality Date  . ABDOMINAL SURGERY    . EUS N/A 08/10/2019   Procedure: FULL UPPER ENDOSCOPIC ULTRASOUND (EUS) RADIAL;  Surgeon: Rayann Heman, MD;  Location: [facility] ENDOSCOPY;  Service: Endoscopy;  Laterality: N/A;    History reviewed. No pertinent family history.  Social History:  reports that he has been smoking cigarettes. He has smoked for the past 20.00  years. He has never used smokeless tobacco. He reports current alcohol use of about 7.0 standard drinks of alcohol per week. He reports previous drug use. Drugs: Marijuana and Cocaine. He cut back to smoking 5-6 cigarettes a day.  He cut down the amount of beers he drinks a day from 9 beers to 3 beers to 1-2 beers. The patient lives in [place]. He is married. The patient is alone today.   Allergies: No Known Allergies  Current Medications: Current Outpatient Medications  Medication Sig Dispense Refill  . apixaban (ELIQUIS) 5 MG TABS tablet Take 1 tablet (5 mg total) by mouth 2 (two) times daily. 60 tablet 3  . ibuprofen (ADVIL) 200 MG tablet Take 200 mg by mouth every 6 (six) hours as needed.    . Multiple Vitamin (MULTIVITAMIN WITH MINERALS) TABS tablet Take 1 tablet by mouth daily. 30 tablet 0  . vitamin B-12 (CYANOCOBALAMIN) 1000 MCG tablet Take 1,000 mcg by mouth daily.     No current facility-administered medications for this visit.    Review of Systems  Constitutional: Positive for chills, malaise/fatigue and weight loss (8 pounds). Negative for diaphoresis and fever.       "Not feeling good" x 2 days.  HENT: Positive for congestion (runny nose- new). Negative for ear discharge, ear pain, hearing loss, nosebleeds, sinus pain, sore throat and tinnitus.   Eyes: Negative.  Negative for blurred vision, double vision, photophobia and pain.  Respiratory: Negative.  Negative for cough, hemoptysis, sputum production, shortness of breath and wheezing.   Cardiovascular: Negative.  Negative for chest pain, palpitations and leg swelling.  Gastrointestinal: Negative for abdominal pain, blood in stool, constipation, diarrhea, heartburn, melena, nausea and vomiting.       Poor appetite.  Genitourinary: Negative.  Negative for dysuria, flank pain, frequency, hematuria and urgency.  Musculoskeletal: Negative for back pain, falls, joint pain, myalgias and neck pain.       Feels achy all over.  Skin:  Negative.  Negative for itching and rash.  Neurological: Positive for dizziness, weakness (generalized) and headaches (right sided). Negative for tingling, sensory change, speech change and focal weakness.  Endo/Heme/Allergies: Negative.  Does not bruise/bleed easily.  Psychiatric/Behavioral: Positive for substance abuse (minimal alcohol intake). Negative for depression and memory loss. The patient is not nervous/anxious and does not have insomnia.   All other systems reviewed and are negative.  Performance status (ECOG): 2  Vitals Blood pressure 107/63, pulse (!) 51, temperature 98.7 F (37.1 C), temperature source Tympanic, resp. rate 18, weight 119 lb 0.8 oz (54 kg), SpO2 100 %.   Physical Exam Vitals and nursing note reviewed.  Constitutional:      General: He is not in acute distress.    Appearance: Normal appearance. He is not toxic-appearing or diaphoretic.     Interventions: Face mask in place.     Comments: Fatigued appearing gentleman sitting in the exam room in no acute distress.  HENT:     Head: Normocephalic and atraumatic.     Comments: Skull cap.  Short dark hair. Mustache.    Mouth/Throat:     Mouth: Mucous membranes are moist.     Pharynx: Oropharynx is clear.  Eyes:     General: No scleral icterus.    Extraocular Movements: Extraocular movements intact.     Conjunctiva/sclera: Conjunctivae normal.     Pupils: Pupils are equal, round, and reactive to light.     Comments: Brown eyes.  Cardiovascular:     Rate and Rhythm: Regular rhythm.     Heart sounds: Normal heart sounds. No murmur heard.   Pulmonary:     Effort: Pulmonary effort is normal. No respiratory distress.     Breath sounds: Normal breath sounds. No wheezing or rales.  Chest:     Chest wall: No tenderness.  Abdominal:     General: Bowel sounds are normal. There is no distension.     Palpations: Abdomen is soft. There is no mass.     Tenderness: There is no abdominal tenderness. There is no  guarding or rebound.  Musculoskeletal:        General: No swelling or tenderness. Normal range of motion.     Cervical back: Normal range of motion and neck supple.  Lymphadenopathy:     Head:     Right side of head: No preauricular, posterior auricular or occipital adenopathy.     Left side of head: No preauricular, posterior auricular or occipital adenopathy.     Cervical: No cervical adenopathy.     Upper Body:     Right upper body: No supraclavicular or axillary adenopathy.     Left upper body: No supraclavicular or axillary adenopathy.     Lower Body: No right inguinal adenopathy. No left inguinal adenopathy.  Skin:    General: Skin is warm and dry.  Neurological:     Mental Status: He is alert and oriented to person, place, and time. Mental status is  at baseline.  Psychiatric:        Mood and Affect: Mood normal.        Behavior: Behavior normal.        Thought Content: Thought content normal.        Judgment: Judgment normal.    Orders Only on 11/28/2019  Component Date Value Ref Range Status  . Sodium 11/28/2019 127* 135 - 145 mmol/L Final  . Potassium 11/28/2019 3.5  3.5 - 5.1 mmol/L Final  . Chloride 11/28/2019 91* 98 - 111 mmol/L Final  . CO2 11/28/2019 24  22 - 32 mmol/L Final  . Glucose, Bld 11/28/2019 107* 70 - 99 mg/dL Final   Glucose reference range applies only to samples taken after fasting for at least 8 hours.  . BUN 11/28/2019 17  8 - 23 mg/dL Final  . Creatinine, Ser 11/28/2019 1.82* 0.61 - 1.24 mg/dL Final  . Calcium 16/10/9602 8.2* 8.9 - 10.3 mg/dL Final  . Total Protein 11/28/2019 6.6  6.5 - 8.1 g/dL Final  . Albumin 54/09/8117 3.0* 3.5 - 5.0 g/dL Final  . AST 14/78/2956 236* 15 - 41 U/L Final  . ALT 11/28/2019 110* 0 - 44 U/L Final  . Alkaline Phosphatase 11/28/2019 341* 38 - 126 U/L Final  . Total Bilirubin 11/28/2019 3.5* 0.3 - 1.2 mg/dL Final  . GFR, Estimated 11/28/2019 41* >60 mL/min Final   Comment: (NOTE) Calculated using the CKD-EPI  Creatinine Equation (2021)   . Anion gap 11/28/2019 12  5 - 15 Final   Performed at [facility], [address] [place], [place] [address]  . WBC 11/28/2019 10.2  4.0 - 10.5 K/uL Final  . RBC 11/28/2019 4.13* 4.22 - 5.81 MIL/uL Final  . Hemoglobin 11/28/2019 11.2* 13.0 - 17.0 g/dL Final  . HCT 65/78/4696 32.1* 39 - 52 % Final  . MCV 11/28/2019 77.7* 80.0 - 100.0 fL Final  . MCH 11/28/2019 27.1  26.0 - 34.0 pg Final  . MCHC 11/28/2019 34.9  30.0 - 36.0 g/dL Final  . RDW 29/52/8413 17.0* 11.5 - 15.5 % Final  . Platelets 11/28/2019 107* 150 - 400 K/uL Final   Comment: Immature Platelet Fraction may be clinically indicated, consider ordering this additional test [id number]   . nRBC 11/28/2019 0.0  0.0 - 0.2 % Final  . Neutrophils Relative % 11/28/2019 85  % Final  . Neutro Abs 11/28/2019 8.6* 1.7 - 7.7 K/uL Final  . Lymphocytes Relative 11/28/2019 8  % Final  . Lymphs Abs 11/28/2019 0.8  0.7 - 4.0 K/uL Final  . Monocytes Relative 11/28/2019 4  % Final  . Monocytes Absolute 11/28/2019 0.4  0.1 - 1.0 K/uL Final  . Eosinophils Relative 11/28/2019 2  % Final  . Eosinophils Absolute 11/28/2019 0.2  0.0 - 0.5 K/uL Final  . Basophils Relative 11/28/2019 0  % Final  . Basophils Absolute 11/28/2019 0.0  0.0 - 0.1 K/uL Final  . Immature Granulocytes 11/28/2019 1  % Final  . Abs Immature Granulocytes 11/28/2019 0.08* 0.00 - 0.07 K/uL Final  . Target Cells 11/28/2019 PRESENT   Final  . Giant PLTs 11/28/2019 PRESENT   Final   Performed at [facility], [address], [place], [place] [address]  Appointment on 11/28/2019  Component Date Value Ref Range Status  . Total Protein 11/28/2019 6.5  6.5 - 8.1 g/dL Final  . Albumin 44/03/4740 2.9* 3.5 - 5.0 g/dL Final  . AST 59/56/3875 238* 15 - 41 U/L Final  .  ALT 11/28/2019 109* 0 - 44 U/L Final  . Alkaline Phosphatase 11/28/2019 339* 38 - 126 U/L Final  . Total Bilirubin 11/28/2019 3.5* 0.3 - 1.2 mg/dL Final  . Bilirubin,  Direct 11/28/2019 2.1* 0.0 - 0.2 mg/dL Final  . Indirect Bilirubin 11/28/2019 1.4* 0.3 - 0.9 mg/dL Final   Performed at [facility], [address] [place], [place] [id number]    Assessment:  Wyatt CroakRobert M Seiler is a 64 y.o. 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  He is on Eliquis.  Abdominal MRI on 05/30/2019 revealed a 4.3 x 3.2 x 2.7 cm poorly defined masslike areain the inferior aspect of the pancreatic head. Region was heterogeneous but predominantly T1 isointense and T2 iso to hyperintense. There was severe ductal dilatation of the main pancreatic duct(up to 1.4 cm)in the body of the pancreas.There was possible direct invasion of the medial wall of the descending portion of the duodenum.There were abundant calcifications in this region. The possibility of acute on chronic pancreatitis resulting in this masslike appearance was not excluded. Underlying neoplasm was difficult to exclude.    CA 19-9 has been followed (0-35): 132 on 05/31/2019 and 31 on 08/14/2019.    Work up on 06/02/2019 included a hematocrit 29.5, hemoglobin 9.5, platelets 316,000, WBC 4,800.  Retic was 1.0%.  Ferritin was 42 with and iron saturation of 9% and a TIBC of 363.  Vitamin B12 was 315 with folate 7.0.  Hypercoagulable work-up was negative:  Factor V Leiden, prothrombin gene mutation, lupus anticoagulant panel, beta-2-glycoprotein (IgG <9, IgM <9, IgA <9), anti-cardiolipin antibodies (IgG <9, IgM 18, IgA 13).  Upper endoscopic ultrasound on 08/10/2019 revealed changes consistent with moderate-severe chronic pancreatitis. There was no focal mass seen, but the sensitivity of EUS for pancreas masses was decreased in the setting of calcific pancreatitis. There was a 8.2 x 6.3 cm cystic lesion seen in the peripancreatic region.  Tissue was not obtained.  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 This was new compared to the CT in 05/2019.   Abdomen MRI on 10/10/2019 was substantially motion degraded scan, limiting assessment. There were stable findings of chronic pancreatitis. There was no discrete pancreatic mass, no peripancreatic fluid collections or cystic lesions. 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 There was high-grade venous narrowing at the confluence of the main portal, splenic and SMV without discrete residual thrombosis. There was chronic moderate right hydronephrosis with asymmetric right renal atrophy.   He has iron deficiency.  Ferritin was 42 with an iron saturation of 9% on 06/02/2019. Iron saturation was 30% on 08/14/2019.  Ferritin has been followed: 42 on 06/02/2019, 16 on 07/05/2019 and 51 on 08/14/2019.    He has B12 deficiency.  B12 was 880 and folate 6.6 on 08/14/2019.  He is on oral B12.   He has leukopenia and anemia.  Labs on 10/16/2019 revealed a reticulocyte count  of 1.1%. LDH was 327 (98-192). Hepatitis B core antibody, hepatitis C antibody, HIV antibody, and ANA were negative.  Flow cytometry was negative.  Peripheral smear revealed no abnormalities.   He plans to receive the COVID-19 vaccine later this month.   Symptomatically, he has been profoundly fatigued x 2 days.  Appetite is poor.  He has lost 8 pounds.  He describes chills, generalized achiness, and a runny  nose.  Exam revealed orthostatic hypotension and an irregular rhythm.  Plan: 1.   Labs today: CBC with diff, CMP. 2.   Generalized fatigue, achiness and runny nose  Concern for COVID-19 or influenza A/B  Patient to Urgent Care for testing. 3.  Orthostatic hypotension and irregular pulse  Clinically dehydrated.  Discuss need for evaluation and management in Urgent Care/ER.  Anticipate EKG and IVF. 4.   Acute renal insufficiency  Creatinine 1.82  (previously 0.97).  Clinically dehydrated.  Patient to [facility] then likely [facility]. 5.   Elevated LFTs  LFTs have increased (bilirubin 0.5 to 3.5; AST/ALT 236 and 110; alkaline phosphatase 341).  Anticipate RUQ ultrasound. 6.   Pancreatic head mass  Endoscopic ultrasound on 07/2202021 felt c/w chronic pancreatitis.   Sensitivity of EUS is decreased in the setting of calcific pancreatitis.   He has a large cystic lesion in the peripancreatic region suggestive of a pancreatic pseudocyst.  CA 19-9 was 31 (normal) on 08/14/2019.  Abdominal MRI on 10/10/2019 was motion degraded.   Findings c/w chronic pancreatitis.  He has an appointment with Dr Servando Snare on 12/27/2019.  Anticipate follow-up abdominal MRI around 01/09/2020. 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  Hypercoagulable work-up negative: factor V Leiden, prothrombin gene mutation, lupus anticoagulant. He has no family history of thrombosis.  Abdominal MRI on 10/10/2019 revealed narrowing at the confluence of the main portal, splenic and SMV without discrete residual thrombosis.   No current evidence of malignancy.  Continue Eliquis. 8.   Normocytic anemia  Hematocrit 32.1.  Hemoglobin 11.2.  MCV 77.7 on 11/28/2019.  He denies any bleeding. Diet is modest.    He remains on ferrous sulfate 325 mg p.o. daily with orange juice or vitamin C. 9.  Weight loss  Weight loss of 8 pounds with acute illness.  Etiology secondary to dehydration and poor oral intake. 10.   Patient to Urgent Care. 11.   RTC in 2 weeks for MD assessment.  I discussed the assessment and treatment plan with the patient.  The patient was provided an opportunity to ask questions and all were answered.  The patient agreed with the plan and demonstrated an understanding of the instructions.  The patient was advised to call back if the symptoms worsen or if the condition fails to  improve as anticipated.     Rosey Bath, MD, PhD    11/28/2019 , 1:20 PM

## 2019-11-28 ENCOUNTER — Encounter: Payer: Self-pay | Admitting: Emergency Medicine

## 2019-11-28 ENCOUNTER — Other Ambulatory Visit: Payer: Self-pay

## 2019-11-28 ENCOUNTER — Telehealth: Payer: Self-pay

## 2019-11-28 ENCOUNTER — Emergency Department
Admission: EM | Admit: 2019-11-28 | Discharge: 2019-11-28 | Disposition: A | Discharge status: Home / Self Care | Payer: Self-pay | Attending: Emergency Medicine | Admitting: Emergency Medicine

## 2019-11-28 ENCOUNTER — Emergency Department: Payer: Self-pay

## 2019-11-28 ENCOUNTER — Ambulatory Visit
Admission: EM | Admit: 2019-11-28 | Discharge: 2019-11-28 | Disposition: A | Discharge status: Home / Self Care | Payer: Self-pay | Attending: Physician Assistant | Admitting: Physician Assistant

## 2019-11-28 ENCOUNTER — Inpatient Hospital Stay: Payer: Self-pay | Attending: Hematology and Oncology | Admitting: Hematology and Oncology

## 2019-11-28 ENCOUNTER — Emergency Department
Admission: EM | Admit: 2019-11-28 | Discharge: 2019-11-28 | Disposition: A | Discharge status: Home / Self Care | Payer: Self-pay | Attending: Student in an Organized Health Care Education/Training Program | Admitting: Student in an Organized Health Care Education/Training Program

## 2019-11-28 ENCOUNTER — Encounter: Payer: Self-pay | Admitting: Hematology and Oncology

## 2019-11-28 ENCOUNTER — Inpatient Hospital Stay: Payer: Self-pay

## 2019-11-28 VITALS — BP 107/63 | HR 51 | Temp 98.7°F | Resp 18 | Wt 119.0 lb

## 2019-11-28 DIAGNOSIS — N289 Disorder of kidney and ureter, unspecified: Secondary | ICD-10-CM

## 2019-11-28 DIAGNOSIS — Z7901 Long term (current) use of anticoagulants: Secondary | ICD-10-CM | POA: Insufficient documentation

## 2019-11-28 DIAGNOSIS — K55069 Acute infarction of intestine, part and extent unspecified: Secondary | ICD-10-CM

## 2019-11-28 DIAGNOSIS — D509 Iron deficiency anemia, unspecified: Secondary | ICD-10-CM

## 2019-11-28 DIAGNOSIS — D649 Anemia, unspecified: Secondary | ICD-10-CM | POA: Insufficient documentation

## 2019-11-28 DIAGNOSIS — K8689 Other specified diseases of pancreas: Secondary | ICD-10-CM | POA: Insufficient documentation

## 2019-11-28 DIAGNOSIS — E871 Hypo-osmolality and hyponatremia: Secondary | ICD-10-CM | POA: Insufficient documentation

## 2019-11-28 DIAGNOSIS — R519 Headache, unspecified: Secondary | ICD-10-CM | POA: Insufficient documentation

## 2019-11-28 DIAGNOSIS — R7989 Other specified abnormal findings of blood chemistry: Secondary | ICD-10-CM

## 2019-11-28 DIAGNOSIS — R799 Abnormal finding of blood chemistry, unspecified: Secondary | ICD-10-CM | POA: Insufficient documentation

## 2019-11-28 DIAGNOSIS — Z5321 Procedure and treatment not carried out due to patient leaving prior to being seen by health care provider: Secondary | ICD-10-CM | POA: Insufficient documentation

## 2019-11-28 DIAGNOSIS — I951 Orthostatic hypotension: Secondary | ICD-10-CM

## 2019-11-28 DIAGNOSIS — E538 Deficiency of other specified B group vitamins: Secondary | ICD-10-CM | POA: Insufficient documentation

## 2019-11-28 DIAGNOSIS — R634 Abnormal weight loss: Secondary | ICD-10-CM

## 2019-11-28 DIAGNOSIS — R001 Bradycardia, unspecified: Secondary | ICD-10-CM | POA: Insufficient documentation

## 2019-11-28 DIAGNOSIS — R531 Weakness: Secondary | ICD-10-CM | POA: Insufficient documentation

## 2019-11-28 DIAGNOSIS — I499 Cardiac arrhythmia, unspecified: Secondary | ICD-10-CM | POA: Insufficient documentation

## 2019-11-28 DIAGNOSIS — I81 Portal vein thrombosis: Secondary | ICD-10-CM

## 2019-11-28 DIAGNOSIS — K862 Cyst of pancreas: Secondary | ICD-10-CM

## 2019-11-28 DIAGNOSIS — D72819 Decreased white blood cell count, unspecified: Secondary | ICD-10-CM | POA: Insufficient documentation

## 2019-11-28 LAB — CBC WITH DIFFERENTIAL/PLATELET
Abs Immature Granulocytes: 0.08 10*3/uL — ABNORMAL HIGH (ref 0.00–0.07)
Basophils Absolute: 0 10*3/uL (ref 0.0–0.1)
Basophils Relative: 0 %
Eosinophils Absolute: 0.2 10*3/uL (ref 0.0–0.5)
Eosinophils Relative: 2 %
HCT: 32.1 % — ABNORMAL LOW (ref 39.0–52.0)
Hemoglobin: 11.2 g/dL — ABNORMAL LOW (ref 13.0–17.0)
Immature Granulocytes: 1 %
Lymphocytes Relative: 8 %
Lymphs Abs: 0.8 10*3/uL (ref 0.7–4.0)
MCH: 27.1 pg (ref 26.0–34.0)
MCHC: 34.9 g/dL (ref 30.0–36.0)
MCV: 77.7 fL — ABNORMAL LOW (ref 80.0–100.0)
Monocytes Absolute: 0.4 10*3/uL (ref 0.1–1.0)
Monocytes Relative: 4 %
Neutro Abs: 8.6 10*3/uL — ABNORMAL HIGH (ref 1.7–7.7)
Neutrophils Relative %: 85 %
Platelets: 107 10*3/uL — ABNORMAL LOW (ref 150–400)
RBC: 4.13 MIL/uL — ABNORMAL LOW (ref 4.22–5.81)
RDW: 17 % — ABNORMAL HIGH (ref 11.5–15.5)
WBC: 10.2 10*3/uL (ref 4.0–10.5)
nRBC: 0 % (ref 0.0–0.2)

## 2019-11-28 LAB — BASIC METABOLIC PANEL
Anion gap: 14 (ref 5–15)
BUN: 19 mg/dL (ref 8–23)
CO2: 21 mmol/L — ABNORMAL LOW (ref 22–32)
Calcium: 8.2 mg/dL — ABNORMAL LOW (ref 8.9–10.3)
Chloride: 93 mmol/L — ABNORMAL LOW (ref 98–111)
Creatinine, Ser: 1.99 mg/dL — ABNORMAL HIGH (ref 0.61–1.24)
GFR, Estimated: 37 mL/min — ABNORMAL LOW (ref >60)
Glucose, Bld: 125 mg/dL — ABNORMAL HIGH (ref 70–99)
Potassium: 3.6 mmol/L (ref 3.5–5.1)
Sodium: 128 mmol/L — ABNORMAL LOW (ref 135–145)

## 2019-11-28 LAB — COMPREHENSIVE METABOLIC PANEL
ALT: 110 U/L — ABNORMAL HIGH (ref 0–44)
AST: 236 U/L — ABNORMAL HIGH (ref 15–41)
Albumin: 3 g/dL — ABNORMAL LOW (ref 3.5–5.0)
Alkaline Phosphatase: 341 U/L — ABNORMAL HIGH (ref 38–126)
Anion gap: 12 (ref 5–15)
BUN: 17 mg/dL (ref 8–23)
CO2: 24 mmol/L (ref 22–32)
Calcium: 8.2 mg/dL — ABNORMAL LOW (ref 8.9–10.3)
Chloride: 91 mmol/L — ABNORMAL LOW (ref 98–111)
Creatinine, Ser: 1.82 mg/dL — ABNORMAL HIGH (ref 0.61–1.24)
GFR, Estimated: 41 mL/min — ABNORMAL LOW (ref >60)
Glucose, Bld: 107 mg/dL — ABNORMAL HIGH (ref 70–99)
Potassium: 3.5 mmol/L (ref 3.5–5.1)
Sodium: 127 mmol/L — ABNORMAL LOW (ref 135–145)
Total Bilirubin: 3.5 mg/dL — ABNORMAL HIGH (ref 0.3–1.2)
Total Protein: 6.6 g/dL (ref 6.5–8.1)

## 2019-11-28 LAB — CBC
HCT: 30.8 % — ABNORMAL LOW (ref 39.0–52.0)
Hemoglobin: 11.1 g/dL — ABNORMAL LOW (ref 13.0–17.0)
MCH: 27.8 pg (ref 26.0–34.0)
MCHC: 36 g/dL (ref 30.0–36.0)
MCV: 77 fL — ABNORMAL LOW (ref 80.0–100.0)
Platelets: 118 10*3/uL — ABNORMAL LOW (ref 150–400)
RBC: 4 MIL/uL — ABNORMAL LOW (ref 4.22–5.81)
RDW: 16.5 % — ABNORMAL HIGH (ref 11.5–15.5)
WBC: 10.5 10*3/uL (ref 4.0–10.5)
nRBC: 0 % (ref 0.0–0.2)

## 2019-11-28 LAB — HEPATIC FUNCTION PANEL
ALT: 109 U/L — ABNORMAL HIGH (ref 0–44)
AST: 238 U/L — ABNORMAL HIGH (ref 15–41)
Albumin: 2.9 g/dL — ABNORMAL LOW (ref 3.5–5.0)
Alkaline Phosphatase: 339 U/L — ABNORMAL HIGH (ref 38–126)
Bilirubin, Direct: 2.1 mg/dL — ABNORMAL HIGH (ref 0.0–0.2)
Indirect Bilirubin: 1.4 mg/dL — ABNORMAL HIGH (ref 0.3–0.9)
Total Bilirubin: 3.5 mg/dL — ABNORMAL HIGH (ref 0.3–1.2)
Total Protein: 6.5 g/dL (ref 6.5–8.1)

## 2019-11-28 LAB — TROPONIN I (HIGH SENSITIVITY): Troponin I (High Sensitivity): 14 ng/L (ref <18)

## 2019-11-28 LAB — LIPASE, BLOOD: Lipase: 22 U/L (ref 11–51)

## 2019-11-28 MED ORDER — ACETAMINOPHEN 500 MG PO TABS: 500.0000 mg | ORAL_TABLET | Freq: Once | ORAL | Status: DC

## 2019-11-28 NOTE — ED Provider Notes (Signed)
[facility]    CSN: [id number] Arrival date & time: 11/28/19  1016      History   Chief Complaint Chief Complaint  Patient presents with  . Weakness  . Dizziness  . Fatigue    HPI Wyatt Montgomery is a 64 y.o. male presenting for dizziness, weakness, fatigue and decreased appetite for the past 4 days.  Patient states that he has also had headaches and felt nauseous from time to time.  He denies any fever, body aches, chest pain, palpitations, wheezing, breathing difficulty, syncope, abdominal pain, diarrhea, cough, sore throat, congestion.  He denies any recent Covid exposure.  He has not been vaccinated for Covid.  Patient presented to our department from the oncology clinic [facility].  Dr. Mike Gip sent him to the urgent care due to concerns for bradycardia.  His heart rate was 37.  He also had some orthostatic hypotension.  Patient has a history of alcohol induced chronic pancreatitis, iron deficiency anemia, pancreatic cysts, thrombosis of mesenteric and portal veins.  He does take Eliquis regularly.   Patient denies any history of cardiopulmonary disease.  He denies any history of arrhythmias or MI.  Admits to right-sided headache that is moderate.  He has not take anything for the headache.  Denies any vision changes, numbness, weakness or tingling.  No history of stroke.  Patient has no other concerns today.   HPI  Past Medical History:  Diagnosis Date  . Pancreatic mass   . Thrombosis, portal vein     Patient Active Problem List   Diagnosis Date Noted  . Goals of care, counseling/discussion 10/16/2019  . Leukopenia 10/11/2019  . Elevated LFTs 10/11/2019  . Normocytic anemia 08/14/2019  . Iron deficiency anemia 08/14/2019  . B12 deficiency 08/14/2019  . Cyst of pancreas 08/14/2019  . Weight loss 08/14/2019  . Thrombosis of mesenteric vein (HCC)   . Pancreatic mass   . 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 Alcohol-induced chronic  pancreatitis ([place])   . Nausea and vomiting   . Hyponatremia     Past Surgical History:  Procedure Laterality Date  . ABDOMINAL SURGERY    . EUS N/A 08/10/2019   Procedure: FULL UPPER ENDOSCOPIC ULTRASOUND (EUS) RADIAL;  Surgeon: Jola Schmidt, MD;  Location: [facility] ENDOSCOPY;  Service: Endoscopy;  Laterality: N/A;       Home Medications    Prior to Admission medications   Medication Sig Start Date End Date Taking? Authorizing Provider  apixaban (ELIQUIS) 5 MG TABS tablet Take 1 tablet (5 mg total) by mouth 2 (two) times daily. 10/30/19  Yes Corcoran, Drue Second, MD  ibuprofen (ADVIL) 200 MG tablet Take 200 mg by mouth every 6 (six) hours as needed.   Yes [provider]  Multiple Vitamin (MULTIVITAMIN WITH MINERALS) TABS tablet Take 1 tablet by mouth daily. 05/31/19  Yes Fritzi Mandes, MD  vitamin B-12 (CYANOCOBALAMIN) 1000 MCG tablet Take 1,000 mcg by mouth daily.   Yes [provider]    Family History History reviewed. No pertinent family history.  Social History Social History   Tobacco Use  . Smoking status: Heavy Tobacco Smoker    Years: 20.00    Types: Cigarettes  . Smokeless tobacco: Never Used  . Tobacco comment: decreased, 1/2 pack per week.  Vaping Use  . Vaping Use: Never used  Substance Use Topics  . Alcohol use: Yes    Alcohol/week: 7.0 standard drinks    Types: 7 Cans of beer per week  .  Drug use: Not Currently    Types: Marijuana, Cocaine     Allergies   Patient has no known allergies.   Review of Systems Review of Systems  Constitutional: Positive for chills, fatigue and unexpected weight change. Negative for appetite change, diaphoresis and fever.  HENT: Negative for congestion, rhinorrhea, sinus pressure, sinus pain and sore throat.   Eyes: Negative for visual disturbance.  Respiratory: Negative for cough, chest tightness and shortness of breath.   Cardiovascular: Negative for chest pain, palpitations and leg swelling.    Gastrointestinal: Positive for nausea. Negative for abdominal pain, diarrhea and vomiting.  Musculoskeletal: Negative for myalgias.  Neurological: Positive for dizziness, weakness and headaches. Negative for syncope, facial asymmetry, light-headedness and numbness.  Hematological: Negative for adenopathy.  Psychiatric/Behavioral: Negative for confusion.     Physical Exam Triage Vital Signs ED Triage Vitals  Enc Vitals Group     BP 11/28/19 1117 (!) 117/93     Pulse Rate 11/28/19 1117 (!) 37     Resp 11/28/19 1117 18     Temp 11/28/19 1117 98.3 F (36.8 C)     Temp Source 11/28/19 1117 Oral     SpO2 11/28/19 1117 100 %     Weight 11/28/19 1118 130 lb (59 kg)     Height 11/28/19 1118 _0  (1.778 m)     Head Circumference --      Peak Flow --      Pain Score 11/28/19 1118 0     Pain Loc --      Pain Edu? --      Excl. in [place]? --    No data found.  Updated Vital Signs BP (!) 117/93 (BP Location: Left Arm)   Pulse (!) 37   Temp 98.3 F (36.8 C) (Oral)   Resp 18   Ht _1  (1.778 m)   Wt 130 lb (59 kg)   SpO2 100%   BMI 18.65 kg/m        Physical Exam Vitals and nursing note reviewed.  Constitutional:      General: He is not in acute distress.    Appearance: Normal appearance. He is well-developed. He is not ill-appearing, toxic-appearing or diaphoretic.  HENT:     Head: Normocephalic and atraumatic.     Nose: Nose normal.     Mouth/Throat:     Mouth: Mucous membranes are moist.     Pharynx: Oropharynx is clear.  Eyes:     General: No scleral icterus.    Conjunctiva/sclera: Conjunctivae normal.     Pupils: Pupils are equal, round, and reactive to light.  Cardiovascular:     Rate and Rhythm: Normal rate. Rhythm regularly irregular.     Pulses: Normal pulses.  Pulmonary:     Effort: Pulmonary effort is normal. No respiratory distress.     Breath sounds: Normal breath sounds. No wheezing, rhonchi or rales.  Abdominal:     Palpations: Abdomen is soft.      Tenderness: There is abdominal tenderness (mild periumbilcal TTP). There is no guarding.  Musculoskeletal:     Cervical back: Neck supple.     Right lower leg: No edema.     Left lower leg: No edema.  Skin:    General: Skin is warm and dry.  Neurological:     General: No focal deficit present.     Mental Status: He is alert and oriented to person, place, and time. Mental status is at baseline.     Cranial Nerves: No  cranial nerve deficit.     Motor: No weakness.     Gait: Gait normal.  Psychiatric:        Mood and Affect: Mood normal.        Behavior: Behavior normal.        Thought Content: Thought content normal.      UC Treatments / Results  Labs (all labs ordered are listed, but only abnormal results are displayed) Labs Reviewed - No data to display  EKG   Radiology No results found.  Procedures ED EKG  Date/Time: 11/28/2019 12:04 PM Performed by: Danton Clap, PA-C Authorized by: Danton Clap, PA-C   ECG reviewed by ED Physician in the absence of a cardiologist: yes   Previous ECG:    Previous ECG:  Compared to current   Comparison ECG info:  New arrhythmia   Similarity:  Changes noted Interpretation:    Interpretation: abnormal   Rate:    ECG rate:  102   ECG rate assessment: tachycardic   Rhythm:    Rhythm: sinus rhythm   Ectopy:    Ectopy: PAC   QRS:    QRS axis:  Normal   QRS intervals:  Normal Conduction:    Conduction: normal   ST segments:    ST segments:  Normal T waves:    T waves: normal   Other findings:    Other findings: LVH   Comments:     Sinus tachycardia with PACs, LVH   (including critical care time)  Medications Ordered in UC Medications  acetaminophen (TYLENOL) tablet 500 mg (500 mg Oral Not Given 11/28/19 1143)    Initial Impression / Assessment and Plan / UC Course  I have reviewed the triage vital signs and the nursing notes.  Pertinent labs & imaging results that were available during my care of the patient  were reviewed by me and considered in my medical decision making (see chart for details).   64 year old male presenting to urgent care from the oncology center upstairs for concerns about bradycardia, dizziness and weakness over the past 4 days.  Upon initial exam his pulse rate was 37.  It fluctuated to 70s and up to the 110s.  Blood pressure stable at 117/93 but does drop to 89/51 with standing.  His heart rate is regularly irregular.  Pulses not diminished.  Chest is clear to auscultation.  He denies any chest pain, palpitations or breathing difficulty.  No history of cardiopulmonary disease.  Patient did have labs done today.  A CBC from this morning shows hemoglobin at 11.2 which is up from 9.64 weeks ago.  Also elevated neutrophils.  CMP shows sodium of 127 which is down from 133 4 weeks ago.  Blood glucose 107.  Creatinine elevated at 1.82.  He had a normal creatinine of 0.974 weeks ago.  All liver signs are elevated at this time.  They has been elevated in the past, but are more elevated than a month ago.  AST is 236, ALT 110, alk phos 341.  GFR is 41.  Patient did not have any history of renal disease or liver failure.  Based on patient exam and symptoms, advised immediate transport to the ED via EMS.  Explained to him that he has an arrhythmia, signs of acute renal failure, elevated liver enzymes, hyponatremia.  Advised him to go by EMS, but he declines and says that his friend will take him at this time.  Patient has his friend take him via personal vehicle  AGAINST MEDICAL ADVICE.  Advised him of the dangers of not going by EMS since he does have symptomatic bradycardia.  Patient says he understands the risks and does not want ambulance to be called.  Patient leaving and stable condition and plans to go to [facility] immediately.  I did call and give report to charge nurse that at [facility] about patient. Name given him they will look out for him.   Final Clinical Impressions(s) / UC Diagnoses   Final  diagnoses:  Weakness  Cardiac arrhythmia, unspecified cardiac arrhythmia type  Acute nonintractable headache, unspecified headache type  Hyponatremia  Elevated serum creatinine  Elevated LFTs     Discharge Instructions     You have been advised to follow up immediately in the emergency department for concerning signs.symptoms. If you declined EMS transport, please have a family member take you directly to the ED at this time. Do not delay. Based on concerns about condition, if you do not follow up in th e ED, you may risk poor outcomes including worsening of condition, delayed treatment and potentially life threatening issues. If you have declined to go to the ED at this time, you should call your PCP immediately to set up a follow up appointment.  Go to ED for red flag symptoms, including; fevers you cannot reduce with Tylenol/Motrin, severe headaches, vision changes, numbness/weakness in part of the body, lethargy, confusion, intractable vomiting, severe dehydration, chest pain, breathing difficulty, severe persistent abdominal or pelvic pain, signs of severe infection (increased redness, swelling of an area), feeling faint or passing out, dizziness, etc. You should especially go to the ED for sudden acute worsening of condition if you do not elect to go at this time.     ED Prescriptions    None     PDMP not reviewed this encounter.   Danton Clap, PA-C 11/28/19 1208

## 2019-11-28 NOTE — Telephone Encounter (Signed)
  Please try again.  Hopefully you can reach someone and tell them how important it is that he be seen in the ER.    He will likely be admitted to the hospital.  Rosey Bath, MD

## 2019-11-28 NOTE — ED Triage Notes (Signed)
Patient c/o weakness, dizziness, fatigue and decreased appetite, cough since Friday.

## 2019-11-28 NOTE — Progress Notes (Signed)
Patient reports not feeling well (weak, dizzy, no energy, no appetite). He hasn't had a bowel movement since Sunday but he had diarrhea prior. Top right side of head is sore since Friday. SOB with exertion. Patient weight went from 127 to 119 since last month.    Sitting: 107/63 HR 37-51 Standing: 89/51 HR 80-66

## 2019-11-28 NOTE — ED Notes (Signed)
Informed EDP Isaacs about pt abnormal labs. Upon further review of pt chart, EDP Isaacs decided that pt needs to be seen for further care at this time. Per verbal order from EDP Isaacs, no repeat labs needed at this time.

## 2019-11-28 NOTE — ED Notes (Signed)
Patient is being discharged from the Urgent Care and sent to the Emergency Department via private vehicle. Per Athena Masse, PA, patient is in need of higher level of care due to abnormal EKG, and multiple systematic abnormalities. Patient is aware and verbalizes understanding of plan of care.  Vitals:   11/28/19 1117  BP: (!) 117/93  Pulse: (!) 37  Resp: 18  Temp: 98.3 F (36.8 C)  SpO2: 100%

## 2019-11-28 NOTE — Telephone Encounter (Signed)
Attempted to call patient again, no answer. Will try again tomorrow.

## 2019-11-28 NOTE — Discharge Instructions (Addendum)

## 2019-11-28 NOTE — ED Triage Notes (Signed)
Pt reports went to the MD today and was told his heart rate was 37 and that he needed to come to the ED. Pt denies all symptoms. Pt denies SOB, CP, weakness and other symptoms.

## 2019-11-28 NOTE — Telephone Encounter (Signed)
Spoke with Ms Bonita Quin to inform her that Mr Westall need to go back to the ED per Dr Merlene Pulling, Ms Bonita Quin report he had a beer and didnt want to stay at the ED. She report she is going over to his house now and tryto get him to go to the ED now.

## 2019-11-28 NOTE — ED Triage Notes (Signed)
Pt states he was told by staff at [facility] to come back to the ER due to abnormal labs. Pt is unsure which labs were abnormal.   Pt denies pain at this time.

## 2019-11-28 NOTE — ED Notes (Signed)
Pt called to be roomed with no response, pt is not visualized

## 2019-11-28 NOTE — Telephone Encounter (Signed)
I saw the ED nurse note stating "Pt called to be roomed with no response, pt is not visualized"  I called the patient per Dr Merlene Pulling and a woman answered, I asked to speak to patient and she said "okay" and then I was disconnected. I called back, no answer and no voicemail set up. I messaged the ED nurse to see if they found him, no response yet.

## 2019-11-29 ENCOUNTER — Telehealth: Payer: Self-pay | Admitting: Emergency Medicine

## 2019-11-29 NOTE — Telephone Encounter (Signed)
Called patient due to left emergency department before provider exam to inquire about condition and follow up plans. No answer and no voicemail 

## 2019-12-05 ENCOUNTER — Telehealth: Payer: Self-pay

## 2019-12-05 NOTE — Telephone Encounter (Signed)
  Please try his alternate numbers.  M

## 2019-12-05 NOTE — Telephone Encounter (Signed)
Attempted to call patient to follow up from last visit and ED visit, no answer, unable to leave message

## 2019-12-12 NOTE — Progress Notes (Signed)
[facility]  [address] [place], [place] [address] Phone: [phone]  Fax: [phone]   Clinic Day:  12/13/2019   Referring physician: Rosey Bath, MD  Chief Complaint: Wyatt Montgomery is a 65 y.o. 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  HPI: The patient was last seen in the medical oncology clinic on 11/28/2019. At that time, he had been profoundly fatigued x 2 days.  Appetite was poor.  He had lost 8 pounds.  He described chills, generalized achiness, and a runny nose.  Exam revealed orthostatic hypotension and an irregular rhythm. Hematocrit was 32.1, hemoglobin 11.2, MCV 77.7, platelets 107,000, WBC 10,200. Sodium was 127. Creatinine was 1.82 (CrCl 41 ml/min). Calcium was 8.2 with an albumin of 3.0. AST was 236, ALT 110, alkaline phosphatase 341. Bilirubin was 3.5 (direct 2.1). He continued Eliquis, vitamin B12, and oral iron. He was acutely ill with possible COVID-19 or influenza.  He was directed to the [facility] for management.  The patient was seen in the [facility]. His initial pulse rate was 37 and fluctuated to the 70s and up to the 110s. He was to be transported to the emergency department via EMS. The patient declined EMS transport and his friend drove him.  Per [facility] ER nurse's note, the patient was called to be roomed but there was no response and the patient was not visualized. Laurie Panda, RN and the ER nurse tried to contact the patient by phone multiple times after he left the ER, but was unsuccessful.  During the interim, he has been "fine". He started feeling back to himself about 3 days ago. His appetite has improved. He reports mild diarrhea but his stools are normal color. He denies aching, fever, chills, nausea, vomiting, bruising, and bleeding. He is taking Eliquis. His runny nose and dizziness have resolved.  He  states that he got bedbugs from a friend's house. He called an Orthoptist and they are coming to his house on 12/25/2019.  He is interested in getting the COVID-19 vaccine. He takes vitamin B12. He does not take oral iron.  He is drinking two 16-oz cans of beer per day.   Past Medical History:  Diagnosis Date  . Pancreatic mass   . Thrombosis, portal vein     Past Surgical History:  Procedure Laterality Date  . ABDOMINAL SURGERY    . EUS N/A 08/10/2019   Procedure: FULL UPPER ENDOSCOPIC ULTRASOUND (EUS) RADIAL;  Surgeon: Rayann Heman, MD;  Location: [facility] ENDOSCOPY;  Service: Endoscopy;  Laterality: N/A;    History reviewed. No pertinent family history.  Social History:  reports that he has been smoking cigarettes. He has a 10.00 pack-year smoking history. He has never used smokeless tobacco. He reports current alcohol use of about 7.0 standard drinks of alcohol per week. He reports previous drug use. Drugs: Marijuana and Cocaine. He cut back to smoking 5-6 cigarettes a day.  He cut down the amount of beers he drinks a day from 9 beers to 3 beers to 1-2 beers. The patient lives in [place]. He is married. He retired on 11/20/2019. The patient is alone today.   Allergies: No Known Allergies  Current Medications: Current Outpatient Medications  Medication Sig Dispense Refill  . apixaban (ELIQUIS) 5 MG TABS tablet Take 1 tablet (5 mg total) by mouth 2 (two) times daily. 60 tablet 3  . ibuprofen (  ADVIL) 200 MG tablet Take 200 mg by mouth every 6 (six) hours as needed.    . Multiple Vitamin (MULTIVITAMIN WITH MINERALS) TABS tablet Take 1 tablet by mouth daily. 30 tablet 0  . vitamin B-12 (CYANOCOBALAMIN) 1000 MCG tablet Take 1,000 mcg by mouth daily.     No current facility-administered medications for this visit.    Review of Systems  Constitutional: Negative for chills, diaphoresis, fever, malaise/fatigue and weight loss (up 2 pounds from last visit).       Started feeling back to  normal 3 days ago.  HENT: Negative for congestion, ear discharge, ear pain, hearing loss, nosebleeds, sinus pain, sore throat and tinnitus.   Eyes: Negative.  Negative for blurred vision, double vision, photophobia and pain.  Respiratory: Negative.  Negative for cough, hemoptysis, sputum production, shortness of breath and wheezing.   Cardiovascular: Negative.  Negative for chest pain, palpitations and leg swelling.  Gastrointestinal: Positive for diarrhea (mild). Negative for abdominal pain, blood in stool, constipation, heartburn, melena, nausea and vomiting.       Appetite improved.  Genitourinary: Negative.  Negative for dysuria, flank pain, frequency, hematuria and urgency.  Musculoskeletal: Negative for back pain, falls, joint pain, myalgias and neck pain.  Skin: Negative.  Negative for itching and rash.  Neurological: Negative for dizziness, tingling, sensory change, speech change, focal weakness, weakness and headaches.  Endo/Heme/Allergies: Negative.  Does not bruise/bleed easily.  Psychiatric/Behavioral: Positive for substance abuse (drinks 2 16-oz cans of beer per day). Negative for depression and memory loss. The patient is not nervous/anxious and does not have insomnia.   All other systems reviewed and are negative.  Performance status (ECOG): 1  Vitals Blood pressure (!) 151/95, pulse 81, temperature (!) 96.4 F (35.8 C), temperature source Tympanic, resp. rate 18, weight 121 lb 4.1 oz (55 kg), SpO2 100 %.   Physical Exam Vitals and nursing note reviewed.  Constitutional:      General: He is not in acute distress.    Appearance: Normal appearance. He is not toxic-appearing or diaphoretic.     Interventions: Face mask in place.  HENT:     Head: Normocephalic and atraumatic.     Comments: Short dark hair. Mustache.    Mouth/Throat:     Mouth: Mucous membranes are moist.     Pharynx: Oropharynx is clear.  Eyes:     General: No scleral icterus.    Extraocular Movements:  Extraocular movements intact.     Pupils: Pupils are equal, round, and reactive to light.     Comments: Brown eyes. Ruddy sclera.  Cardiovascular:     Rate and Rhythm: Regular rhythm.     Heart sounds: Normal heart sounds. No murmur heard.   Pulmonary:     Effort: Pulmonary effort is normal. No respiratory distress.     Breath sounds: Normal breath sounds. No wheezing or rales.  Chest:     Chest wall: No tenderness.  Abdominal:     General: Bowel sounds are normal. There is no distension.     Palpations: Abdomen is soft. There is no mass.     Tenderness: There is no abdominal tenderness. There is no guarding or rebound.  Musculoskeletal:        General: No swelling or tenderness. Normal range of motion.     Cervical back: Normal range of motion and neck supple.  Lymphadenopathy:     Head:     Right side of head: No preauricular, posterior auricular or occipital adenopathy.  Left side of head: No preauricular, posterior auricular or occipital adenopathy.     Cervical: No cervical adenopathy.     Upper Body:     Right upper body: No supraclavicular or axillary adenopathy.     Left upper body: No supraclavicular or axillary adenopathy.     Lower Body: No right inguinal adenopathy. No left inguinal adenopathy.  Skin:    General: Skin is warm and dry.  Neurological:     Mental Status: He is alert and oriented to person, place, and time. Mental status is at baseline.  Psychiatric:        Mood and Affect: Mood normal.        Behavior: Behavior normal.        Thought Content: Thought content normal.        Judgment: Judgment normal.    No visits with results within 3 Day(s) from this visit.  Latest known visit with results is:  Admission on 11/28/2019, Discharged on 11/28/2019  Component Date Value Ref Range Status  . Lipase 11/28/2019 22  11 - 51 U/L Final   Performed at [facility], [address] [place], [place] [address]    Assessment:  Wyatt Montgomery is  a 64 y.o. 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  He is on Eliquis.  Abdominal MRI on 05/30/2019 revealed a 4.3 x 3.2 x 2.7 cm poorly defined masslike areain the inferior aspect of the pancreatic head. Region was heterogeneous but predominantly T1 isointense and T2 iso to hyperintense. There was severe ductal dilatation of the main pancreatic duct(up to 1.4 cm)in the body of the pancreas.There was possible direct invasion of the medial wall of the descending portion of the duodenum.There were abundant calcifications in this region. The possibility of acute on chronic pancreatitis resulting in this masslike appearance was not excluded. Underlying neoplasm was difficult to exclude.    CA 19-9 has been followed (0-35): 132 on 05/31/2019 and 31 on 08/14/2019.    Work up on 06/02/2019 included a hematocrit 29.5, hemoglobin 9.5, platelets 316,000, WBC 4,800.  Retic was 1.0%.  Ferritin was 42 with and iron saturation of 9% and a TIBC of 363.  Vitamin B12 was 315 with folate 7.0.  Hypercoagulable work-up was negative:  Factor V Leiden, prothrombin gene mutation, lupus anticoagulant panel, beta-2-glycoprotein (IgG <9, IgM <9, IgA <9), anti-cardiolipin antibodies (IgG <9, IgM 18, IgA 13).  Upper endoscopic ultrasound on 08/10/2019 revealed changes consistent with moderate-severe chronic pancreatitis. There was no focal mass seen, but the sensitivity of EUS for pancreas masses was decreased in the setting of calcific pancreatitis. There was a 8.2 x 6.3 cm cystic lesion seen in the peripancreatic region.  Tissue was not obtained.  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 This was new compared to the CT in 05/2019.   Abdomen MRI on 10/10/2019 was substantially motion degraded scan, limiting assessment. There were stable findings of chronic pancreatitis. There was no  discrete pancreatic mass, no peripancreatic fluid collections or cystic lesions. 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 There was high-grade venous narrowing at the confluence of the main portal, splenic and SMV without discrete residual thrombosis. There was chronic moderate right hydronephrosis with asymmetric right renal atrophy.   He has iron deficiency.  Ferritin was 42 with an iron saturation  of 9% on 06/02/2019. Iron saturation was 30% on 08/14/2019.  Ferritin has been followed: 42 on 06/02/2019, 16 on 07/05/2019 and 51 on 08/14/2019.    He has B12 deficiency.  B12 was 880 and folate 6.6 on 08/14/2019.  He is on oral B12.   He has leukopenia and anemia.  Labs on 10/16/2019 revealed a reticulocyte count  of 1.1%. LDH was 327 (98-192). Hepatitis B core antibody, hepatitis C antibody, HIV antibody, and ANA were negative.  Flow cytometry was negative.  Peripheral smear revealed no abnormalities. He is not taking oral iron.  He has bedbugs.  The exterminator is coming to his house on 12/25/2019.  He plans to receive the COVID-19 vaccine.   Symptomatically, he feels "fine". He started feeling back to himself about 3 days ago. His appetite has improved. He reports mild diarrhea but his stools are normal color. His runny nose and dizziness have resolved.  He is drinking two 16-oz cans of beer per day.  Exam is back to baseline. Sodium is 131.  Plan: 1.   Labs today:  CBC with diff, CMP, ferritin, iron studies. 2.   Pancreatic head mass  Endoscopic ultrasound on 07/2202021 felt c/w chronic pancreatitis.   Sensitivity of EUS is decreased in the setting of calcific pancreatitis.   He has a large cystic lesion in the peripancreatic region suggestive of a pancreatic pseudocyst.  CA 19-9 was 31 (normal) on 08/14/2019.  Abdominal MRI on 10/10/2019 was motion degraded.   Findings c/w chronic  pancreatitis.  He has an appointment with Dr Servando Snare on 12/27/2019.  Consider follow-up abdominal MRI around 01/09/2020. 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  Hypercoagulable work-up is negative: factor V Leiden, prothrombin gene mutation, lupus anticoagulant. He has no family history of thrombosis.  Abdominal MRI on 10/10/2019 revealed narrowing at the confluence of the main portal, splenic and SMV without discrete residual thrombosis.   No current evidence of malignancy.  Continue Eliquis. 4.   Normocytic anemia  Hematocrit 32.1.  Hemoglobin 11.2.  MCV 77.7 on 11/28/2019.  Hematocrit 26.8.  Hemoglobin   8.9.  MCV 83.2 on 12/13/2019.   Ferritin 88 with an iron saturation of 12% (low) and a TIBC 248.  He denies any bleeding.  He has been eating better x 3 days after an acute illness 2 weeks ago.    Restart ferrous sulfate 325 mg po q day with OJ or vitamin C.  Preauth Venofer. 5.   Leukopenia  WBC 2700 with an ANC of 700 (labs back after clinic appt).  Patient has a history of leukopenia.  Contact patient regarding neutropenic precautions.  Patient drinking two 16 oz of beer/day.  Re-evaluation next week.   Check B12, folate, TSH, copper level.   Peripheral smear for review. 6.   Elevated LFTs  AST 236.  ALT 110.  Bilirubin 3.5.  Alkaline phosphatase 341 on 11/28/2019.  AST   51.  ALT   22.  Bilirubin 0.6.  Alkaline phosphatase 144 on 12/13/2019.  Continue to monitor. 7.   Bedbugs  Patient notes exterminator coming 12/25/2019.  Clinic precautions in effect. 8.   Weight loss  He has been eating well x 3 days.  Weight improved 2 pounds since acute illness.  Continue to monitor. 9.   Please provide patient with instructions to Dr Annabell Sabal office. 10.   RTC in 1 month for MD assessment and labs (CBC with diff, CMP, ferritin, iron studies).  I discussed the assessment and  treatment plan with the patient.  The  patient was provided an opportunity to ask questions and all were answered.  The patient agreed with the plan and demonstrated an understanding of the instructions.  The patient was advised to call back if the symptoms worsen or if the condition fails to improve as anticipated.    Rosey BathMelissa C Dandrae Kustra, MD, PhD    12/13/2019 , 11:06 AM   I, Pietro CassisEmily Tufford, am acting as Neurosurgeonscribe for General MotorsMelissa C. Merlene Pullingorcoran, MD, PhD.  I, Javelle Donigan C. Merlene Pullingorcoran, MD, have reviewed the above documentation for accuracy and completeness, and I agree with the above.

## 2019-12-13 ENCOUNTER — Other Ambulatory Visit: Payer: Self-pay

## 2019-12-13 ENCOUNTER — Telehealth: Payer: Self-pay

## 2019-12-13 ENCOUNTER — Inpatient Hospital Stay (HOSPITAL_BASED_OUTPATIENT_CLINIC_OR_DEPARTMENT_OTHER): Payer: Self-pay | Admitting: Hematology and Oncology

## 2019-12-13 ENCOUNTER — Encounter: Payer: Self-pay | Admitting: Hematology and Oncology

## 2019-12-13 VITALS — BP 151/95 | HR 81 | Temp 96.4°F | Resp 18 | Wt 121.3 lb

## 2019-12-13 DIAGNOSIS — K862 Cyst of pancreas: Secondary | ICD-10-CM

## 2019-12-13 DIAGNOSIS — D72819 Decreased white blood cell count, unspecified: Secondary | ICD-10-CM

## 2019-12-13 DIAGNOSIS — R7989 Other specified abnormal findings of blood chemistry: Secondary | ICD-10-CM

## 2019-12-13 DIAGNOSIS — I81 Portal vein thrombosis: Secondary | ICD-10-CM

## 2019-12-13 DIAGNOSIS — D509 Iron deficiency anemia, unspecified: Secondary | ICD-10-CM

## 2019-12-13 DIAGNOSIS — K55069 Acute infarction of intestine, part and extent unspecified: Secondary | ICD-10-CM

## 2019-12-13 DIAGNOSIS — Z7189 Other specified counseling: Secondary | ICD-10-CM

## 2019-12-13 DIAGNOSIS — E871 Hypo-osmolality and hyponatremia: Secondary | ICD-10-CM

## 2019-12-13 LAB — CBC WITH DIFFERENTIAL/PLATELET
Abs Immature Granulocytes: 0 10*3/uL (ref 0.00–0.07)
Basophils Absolute: 0.1 10*3/uL (ref 0.0–0.1)
Basophils Relative: 2 %
Eosinophils Absolute: 0 10*3/uL (ref 0.0–0.5)
Eosinophils Relative: 1 %
HCT: 26.8 % — ABNORMAL LOW (ref 39.0–52.0)
Hemoglobin: 8.9 g/dL — ABNORMAL LOW (ref 13.0–17.0)
Immature Granulocytes: 0 %
Lymphocytes Relative: 58 %
Lymphs Abs: 1.5 10*3/uL (ref 0.7–4.0)
MCH: 27.6 pg (ref 26.0–34.0)
MCHC: 33.2 g/dL (ref 30.0–36.0)
MCV: 83.2 fL (ref 80.0–100.0)
Monocytes Absolute: 0.4 10*3/uL (ref 0.1–1.0)
Monocytes Relative: 14 %
Neutro Abs: 0.7 10*3/uL — ABNORMAL LOW (ref 1.7–7.7)
Neutrophils Relative %: 25 %
Platelets: 297 10*3/uL (ref 150–400)
RBC: 3.22 MIL/uL — ABNORMAL LOW (ref 4.22–5.81)
RDW: 15.9 % — ABNORMAL HIGH (ref 11.5–15.5)
Smear Review: NORMAL
WBC: 2.7 10*3/uL — ABNORMAL LOW (ref 4.0–10.5)
nRBC: 0 % (ref 0.0–0.2)

## 2019-12-13 LAB — IRON AND TIBC
Iron: 30 ug/dL — ABNORMAL LOW (ref 45–182)
Saturation Ratios: 12 % — ABNORMAL LOW (ref 17.9–39.5)
TIBC: 248 ug/dL — ABNORMAL LOW (ref 250–450)
UIBC: 218 ug/dL

## 2019-12-13 LAB — COMPREHENSIVE METABOLIC PANEL
ALT: 22 U/L (ref 0–44)
AST: 51 U/L — ABNORMAL HIGH (ref 15–41)
Albumin: 3.2 g/dL — ABNORMAL LOW (ref 3.5–5.0)
Alkaline Phosphatase: 144 U/L — ABNORMAL HIGH (ref 38–126)
Anion gap: 7 (ref 5–15)
BUN: 9 mg/dL (ref 8–23)
CO2: 23 mmol/L (ref 22–32)
Calcium: 7.9 mg/dL — ABNORMAL LOW (ref 8.9–10.3)
Chloride: 101 mmol/L (ref 98–111)
Creatinine, Ser: 1.1 mg/dL (ref 0.61–1.24)
GFR, Estimated: >60 mL/min (ref >60)
Glucose, Bld: 82 mg/dL (ref 70–99)
Potassium: 4.4 mmol/L (ref 3.5–5.1)
Sodium: 131 mmol/L — ABNORMAL LOW (ref 135–145)
Total Bilirubin: 0.6 mg/dL (ref 0.3–1.2)
Total Protein: 6.6 g/dL (ref 6.5–8.1)

## 2019-12-13 LAB — SEDIMENTATION RATE: Sed Rate: 34 mm/hr — ABNORMAL HIGH (ref 0–20)

## 2019-12-13 LAB — FERRITIN: Ferritin: 88 ng/mL (ref 24–336)

## 2019-12-13 NOTE — Telephone Encounter (Signed)
Spoke with patients "friend" Bonita Quin. She will relay information to patient. She will need to be called to make next appointment. She will be bringing him.

## 2019-12-13 NOTE — Patient Instructions (Signed)
  Ferrous sulfate 325 mg by mouth with orange juice or vitamin C.   Iron Deficiency Anemia, Adult Iron-deficiency anemia is when you have a low amount of red blood cells or hemoglobin. This happens because you have too little iron in your body. Hemoglobin carries oxygen to parts of the body. Anemia can cause your body to not get enough oxygen. It may or may not cause symptoms. Follow these instructions at home: Medicines  Take over-the-counter and prescription medicines only as told by your doctor. This includes iron pills (supplements) and vitamins.  If you cannot handle taking iron pills by mouth, ask your doctor about getting iron through: ? A vein (intravenously). ? A shot (injection) into a muscle.  Take iron pills when your stomach is empty. If you cannot handle this, take them with food.  Do not drink milk or take antacids at the same time as your iron pills.  To prevent trouble pooping (constipation), eat fiber or take medicine (stool softener) as told by your doctor. Eating and drinking   Talk with your doctor before changing the foods you eat. He or she may tell you to eat foods that have a lot of iron, such as: ? Liver. ? Lowfat (lean) beef. ? Breads and cereals that have iron added to them (fortified breads and cereals). ? Eggs. ? Dried fruit. ? Dark green, leafy vegetables.  Drink enough fluid to keep your pee (urine) clear or pale yellow.  Eat fresh fruits and vegetables that are high in vitamin C. They help your body to use iron. Foods with a lot of vitamin C include: ? Oranges. ? Peppers. ? Tomatoes. ? Mangoes. General instructions  Return to your normal activities as told by your doctor. Ask your doctor what activities are safe for you.  Keep yourself clean, and keep things clean around you (your surroundings). Anemia can make you get sick more easily.  Keep all follow-up visits as told by your doctor. This is important. Contact a doctor if:  You feel  sick to your stomach (nauseous).  You throw up (vomit).  You feel weak.  You are sweating for no clear reason.  You have trouble pooping, such as: ? Pooping (having a bowel movement) less than 3 times a week. ? Straining to poop. ? Having poop that is hard, dry, or larger than normal. ? Feeling full or bloated. ? Pain in the lower belly. ? Not feeling better after pooping. Get help right away if:  You pass out (faint). If this happens, do not drive yourself to the hospital. Call your local emergency services (911 in the [place]  You have chest pain.  You have shortness of breath that: ? Is very bad. ? Gets worse with physical activity.  You have a fast heartbeat.  You get light-headed when getting up from sitting or lying down. This information is not intended to replace advice given to you by your health care provider. Make sure you discuss any questions you have with your health care provider. Document Revised: 12/18/2016 Document Reviewed: 09/25/2015 Elsevier Patient Education  2020 [employer].

## 2019-12-13 NOTE — Progress Notes (Signed)
Staff reports that patient has bedbugs. Per protocol, patient was asked to change into paperscrubs and place his personal clothing in bags provided. Patient placed in exam room 5 after changing clothes.

## 2019-12-13 NOTE — Telephone Encounter (Signed)
-----   Message from Rosey Bath, MD sent at 12/13/2019  3:44 PM EST ----- Regarding: Please call patient  Counts have dropped.  ANC is 700.    Review fever and neutropenia precautions.  We will need to see him back next week.  M ----- Message ----- From: Leory Plowman, [facility] [place] Sent: 12/13/2019  11:46 AM EST To: Rosey Bath, MD

## 2019-12-16 DIAGNOSIS — N289 Disorder of kidney and ureter, unspecified: Secondary | ICD-10-CM | POA: Insufficient documentation

## 2019-12-16 DIAGNOSIS — I951 Orthostatic hypotension: Secondary | ICD-10-CM | POA: Insufficient documentation

## 2019-12-19 NOTE — Progress Notes (Signed)
[facility]  [address], [address] [place], [place] [address] Phone: [phone]  Fax: [phone]   Clinic Day:  12/20/2019   Referring physician: Rosey Bath, MD  Chief Complaint: Wyatt Montgomery is a 64 y.o. 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  HPI: The patient was last seen in the medical oncology clinic on 12/13/2019. At that time, he felt "fine". He started feeling back to himself 3 days prior. His appetite had improved. He reported mild diarrhea but his stools were normal color. His runny nose and dizziness had resolved.  He was drinking two 16-oz cans of beer per day.  Exam was at baseline.   Labs revealed a hematocrit 26.8, hemoglobin 8.9, MCV 83.2, platelets 297,000, WBC 2,700 (ANC 700). Creatinine was 1.10 (improved).  Sodium was 131. Calcium was 7.9 with an abumin of 3.2. AST was 51 and alkaline phosphatase was 144. Ferritin was 88 with an iron saturation of 12% and a TIBC of 248. Sed rate was 34. He continued Eliquis.  CBC on 11/28/2019 revealed a hematocrit 32.1, hemoglobin 11.2, MCV 77.7, platelets 107,000, WBC 10,200 (ANC 8,600).  During the interim, he has been "good." He is eating normally. Yesterday, he ate 3 meals. He had eggs, bacon, and biscuits for breakfast. For dinner, he had beef stew, salmon, and biscuits. He had one beer on Thanksgiving and one beer yesterday. He denies any pain or bleeding of any kind.   He is not taking oral iron. He is still taking Eliquis.   Past Medical History:  Diagnosis Date  . Pancreatic mass   . Thrombosis, portal vein     Past Surgical History:  Procedure Laterality Date  . ABDOMINAL SURGERY    . EUS N/A 08/10/2019   Procedure: FULL UPPER ENDOSCOPIC ULTRASOUND (EUS) RADIAL;  Surgeon: Rayann Heman, MD;  Location: [facility] ENDOSCOPY;  Service: Endoscopy;  Laterality: N/A;    History  reviewed. No pertinent family history.  Social History:  reports that he has been smoking cigarettes. He has a 10.00 pack-year smoking history. He has never used smokeless tobacco. He reports current alcohol use of about 7.0 standard drinks of alcohol per week. He reports previous drug use. Drugs: Marijuana and Cocaine. He cut back to smoking 5-6 cigarettes a day.  He cut down the amount of beers he drinks a day from 9 beers to 3 beers to 1-2 beers. The patient lives in [place]. He is married. He retired on 11/20/2019. The patient is alone today.   Allergies: No Known Allergies  Current Medications: Current Outpatient Medications  Medication Sig Dispense Refill  . apixaban (ELIQUIS) 5 MG TABS tablet Take 1 tablet (5 mg total) by mouth 2 (two) times daily. 60 tablet 3  . ibuprofen (ADVIL) 200 MG tablet Take 200 mg by mouth every 6 (six) hours as needed.    . Multiple Vitamin (MULTIVITAMIN WITH MINERALS) TABS tablet Take 1 tablet by mouth daily. 30 tablet 0  . vitamin B-12 (CYANOCOBALAMIN) 1000 MCG tablet Take 1,000 mcg by mouth daily.     No current facility-administered medications for this visit.    Review of Systems  Constitutional: Negative for chills, diaphoresis, fever, malaise/fatigue and weight loss (stable).  HENT: Negative for congestion, ear discharge, ear pain, hearing loss, nosebleeds, sinus pain, sore throat and tinnitus.   Eyes: Negative.  Negative for blurred vision, double vision, photophobia and pain.  Respiratory: Negative.  Negative for cough, hemoptysis, sputum production, shortness of breath and wheezing.   Cardiovascular: Negative.  Negative for chest pain, palpitations and leg swelling.  Gastrointestinal: Negative for abdominal pain, blood in stool, constipation, diarrhea, heartburn, melena, nausea and vomiting.       Normal appetite.  Genitourinary: Negative.  Negative for dysuria, flank pain, frequency, hematuria and urgency.  Musculoskeletal: Negative for back pain,  falls, joint pain, myalgias and neck pain.  Skin: Negative.  Negative for itching and rash.  Neurological: Negative for dizziness, tingling, sensory change, speech change, focal weakness, weakness and headaches.  Endo/Heme/Allergies: Negative.  Does not bruise/bleed easily.  Psychiatric/Behavioral: Positive for substance abuse (2 beers in the past week). Negative for depression and memory loss. The patient is not nervous/anxious and does not have insomnia.   All other systems reviewed and are negative.  Performance status (ECOG): 0  Vitals Blood pressure (!) 161/85, pulse 73, temperature 98 F (36.7 C), temperature source Tympanic, resp. rate 18, weight 121 lb 4.1 oz (55 kg), SpO2 100 %.   Physical Exam Vitals and nursing note reviewed.  Constitutional:      General: He is not in acute distress.    Appearance: Normal appearance. He is not toxic-appearing or diaphoretic.     Interventions: Face mask in place.  HENT:     Head:     Comments: Short dark hair. Mustache. Eyes:     General: No scleral icterus.    Conjunctiva/sclera: Conjunctivae normal.     Comments: Brown eyes. Ruddy sclera.  Neurological:     Mental Status: He is alert and oriented to person, place, and time. Mental status is at baseline.  Psychiatric:        Behavior: Behavior normal.        Thought Content: Thought content normal.        Judgment: Judgment normal.    No visits with results within 3 Day(s) from this visit.  Latest known visit with results is:  Orders Only on 12/13/2019  Component Date Value Ref Range Status  . Sed Rate 12/13/2019 34* 0 - 20 mm/hr Final   Performed at [facility], [address] [place], [place] [address]  . Iron 12/13/2019 30* 45 - 182 ug/dL Final  . TIBC 26/33/3545 248* 250 - 450 ug/dL Final  . Saturation Ratios 12/13/2019 12* 17.9 - 39.5 % Final  . UIBC 12/13/2019 218  ug/dL Final   Performed at [facility], [address] [place], [place]  [address]  . Ferritin 12/13/2019 88  24 - 336 ng/mL Final   Performed at [facility], [address] [place] [place], [place] [address]  . Sodium 12/13/2019 131* 135 - 145 mmol/L Final  . Potassium 12/13/2019 4.4  3.5 - 5.1 mmol/L Final  . Chloride 12/13/2019 101  98 - 111 mmol/L Final  . CO2 12/13/2019 23  22 - 32 mmol/L Final  . Glucose, Bld 12/13/2019 82  70 - 99 mg/dL Final   Glucose reference range applies only to samples taken after fasting for at least 8 hours.  . BUN 12/13/2019 9  8 - 23 mg/dL Final  . Creatinine, Ser 12/13/2019 1.10  0.61 - 1.24 mg/dL Final  . Calcium 42/87/6811 7.9* 8.9 - 10.3 mg/dL Final  . Total Protein 12/13/2019 6.6  6.5 - 8.1 g/dL Final  . Albumin 57/26/2035 3.2* 3.5 - 5.0 g/dL Final  . AST 59/74/1638 51* 15 - 41 U/L Final  . ALT 12/13/2019  22  0 - 44 U/L Final  . Alkaline Phosphatase 12/13/2019 144* 38 - 126 U/L Final  . Total Bilirubin 12/13/2019 0.6  0.3 - 1.2 mg/dL Final  . GFR, Estimated 12/13/2019 >60  >60 mL/min Final   Comment: (NOTE) Calculated using the CKD-EPI Creatinine Equation (2021)   . Anion gap 12/13/2019 7  5 - 15 Final   Performed at [facility], [address], [place], [place] [address]  . WBC 12/13/2019 2.7* 4.0 - 10.5 K/uL Final  . RBC 12/13/2019 3.22* 4.22 - 5.81 MIL/uL Final  . Hemoglobin 12/13/2019 8.9* 13.0 - 17.0 g/dL Final  . HCT 73/71/0626 26.8* 39 - 52 % Final  . MCV 12/13/2019 83.2  80.0 - 100.0 fL Final  . MCH 12/13/2019 27.6  26.0 - 34.0 pg Final  . MCHC 12/13/2019 33.2  30.0 - 36.0 g/dL Final  . RDW 94/85/4627 15.9* 11.5 - 15.5 % Final  . Platelets 12/13/2019 297  150 - 400 K/uL Final  . nRBC 12/13/2019 0.0  0.0 - 0.2 % Final  . Neutrophils Relative % 12/13/2019 25  % Final  . Neutro Abs 12/13/2019 0.7* 1.7 - 7.7 K/uL Final  . Lymphocytes Relative 12/13/2019 58  % Final  . Lymphs Abs 12/13/2019 1.5  0.7 - 4.0 K/uL Final  . Monocytes Relative 12/13/2019 14  % Final  . Monocytes Absolute 12/13/2019  0.4  0.1 - 1.0 K/uL Final  . Eosinophils Relative 12/13/2019 1  % Final  . Eosinophils Absolute 12/13/2019 0.0  0.0 - 0.5 K/uL Final  . Basophils Relative 12/13/2019 2  % Final  . Basophils Absolute 12/13/2019 0.1  0.0 - 0.1 K/uL Final  . WBC Morphology 12/13/2019 MORPHOLOGY UNREMARKABLE   Final  . RBC Morphology 12/13/2019 MORPHOLOGY UNREMARKABLE   Final  . Smear Review 12/13/2019 Normal platelet morphology   Final  . Immature Granulocytes 12/13/2019 0  % Final  . Abs Immature Granulocytes 12/13/2019 0.00  0.00 - 0.07 K/uL Final   Performed at [facility], [address] [place], [place] [address]    Assessment:  SEHAJ MCENROE is a 64 y.o. 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  He is on Eliquis.  Abdominal MRI on 05/30/2019 revealed a 4.3 x 3.2 x 2.7 cm poorly defined masslike areain the inferior aspect of the pancreatic head. Region was heterogeneous but predominantly T1 isointense and T2 iso to hyperintense. There was severe ductal dilatation of the main pancreatic duct(up to 1.4 cm)in the body of the pancreas.There was possible direct invasion of the medial wall of the descending portion of the duodenum.There were abundant calcifications in this region. The possibility of acute on chronic pancreatitis resulting in this masslike appearance was not excluded. Underlying neoplasm was difficult to exclude.    CA 19-9 has been followed (0-35): 132 on 05/31/2019 and 31 on 08/14/2019.    Work up on 06/02/2019 included a hematocrit 29.5, hemoglobin 9.5, platelets 316,000, WBC 4,800.  Retic was 1.0%.  Ferritin was 42 with and iron saturation of 9% and a TIBC of 363.  Vitamin B12 was 315 with folate 7.0.  Hypercoagulable work-up was negative:  Factor V Leiden, prothrombin gene mutation, lupus anticoagulant panel, beta-2-glycoprotein (IgG <9, IgM <9, IgA <9),  anti-cardiolipin antibodies (IgG <9, IgM 18, IgA 13).  Upper endoscopic ultrasound on 08/10/2019 revealed changes consistent with moderate-severe chronic pancreatitis. There was  no focal mass seen, but the sensitivity of EUS for pancreas masses was decreased in the setting of calcific pancreatitis. There was a 8.2 x 6.3 cm cystic lesion seen in the peripancreatic region.  Tissue was not obtained.  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 This was new compared to the CT in 05/2019.   Abdomen MRI on 10/10/2019 was substantially motion degraded scan, limiting assessment. There were stable findings of chronic pancreatitis. There was no discrete pancreatic mass, no peripancreatic fluid collections or cystic lesions. 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 There was high-grade venous narrowing at the confluence of the main portal, splenic and SMV without discrete residual thrombosis. There was chronic moderate right hydronephrosis with asymmetric right renal atrophy.   He has iron deficiency.  Ferritin was 42 with an iron saturation of 9% on 06/02/2019. Iron saturation was 30% on 08/14/2019.  Ferritin has been followed: 42 on 06/02/2019, 16 on 07/05/2019, 51 on 08/14/2019, 88 on 12/13/2019.    He has B12 deficiency.  B12 was 880 and folate 6.6 on 08/14/2019.  He is on oral B12.   He has leukopenia and anemia.  Labs on 10/16/2019 revealed a reticulocyte count  of 1.1%. LDH was 327 (98-192). Hepatitis B core antibody, hepatitis C antibody, HIV antibody, and ANA were negative.  Flow cytometry was negative.  Peripheral smear revealed no abnormalities. He is not taking oral iron.  He has bedbugs.  The exterminator is coming to his house on 12/25/2019.  He plans to receive the COVID-19 vaccine.   Symptomatically, he has felt "good." He is eating normally.  He is drinking less.  He  denies any bleeding.  Exam is stable.  Plan: 1.   Labs today: CBC with diff, B12, folate, TSH, copper, retic. 2.   Pancreatic head mass  Endoscopic ultrasound on 07/2202021 felt c/w chronic pancreatitis.   Sensitivity of EUS is decreased in the setting of calcific pancreatitis.   He has a large cystic lesion in the peripancreatic region suggestive of a pancreatic pseudocyst.  CA 19-9 was 31 (normal) on 08/14/2019.  Abdominal MRI on 10/10/2019 was motion degraded.   Findings c/w chronic pancreatitis.  He has an appointment with Dr Servando Snare on 12/27/2019.  Discuss consideration of follow-up abdominal MRI around 01/09/2020. 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  Hypercoagulable work-up is negative: factor V Leiden, prothrombin gene mutation, lupus anticoagulant. He has no family history of thrombosis.  Abdominal MRI on 10/10/2019 revealed narrowing at the confluence of the main portal, splenic and SMV without discrete residual thrombosis.   No current evidence of malignancy.  Continue Eliquis. 4.   Normocytic anemia  Hematocrit 32.1.  Hemoglobin 11.2.  MCV 77.7 on 11/28/2019.  Hematocrit 26.8.  Hemoglobin   8.9.  MCV 83.2 on 12/13/2019.   Ferritin 88 with an iron saturation of 12% (low) and a TIBC 248.  Hematocrit 26.4.  Hemoglobin 8.8.  MCV 84.9 on 12/20/2019.   B12 521.  Folate 17.1.  CRP 0.6.  He denies any bleeding.  He has been eating well.    Continue ferrous sulfate 325 mg po q day with OJ or vitamin C.  Continue to monitor. 5.   Leukopenia  WBC 2,800 with an ANC of 1200.  Patient has a history of leukopenia.  Continue neutropenic precautions.  Continue to decrease alcohol consumption.  B12 and folate are normal.    Copper  is 79 (normal) today.  TSH is 1.388 (normal) today. 6.   Elevated LFTs  AST 236.  ALT 110.  Bilirubin 3.5.  Alkaline phosphatase 341 on 11/28/2019.  AST   51.  ALT   22.   Bilirubin 0.6.  Alkaline phosphatase 144 on 12/13/2019.  Check at next visit. 7.   Bedbugs  Patient notes exterminator coming 12/25/2019.  Clinic precautions remain in effect. 8.   Weight loss  Weight is stable.  He is eating better.  Continue to monitor. 9.   Labs today. 10.   Preauth Venofer. 11.   RTC in 3 weeks as scheduled for MD assessment, labs, and +/- Venofer.  I discussed the assessment and treatment plan with the patient.  The patient was provided an opportunity to ask questions and all were answered.  The patient agreed with the plan and demonstrated an understanding of the instructions.  The patient was advised to call back if the symptoms worsen or if the condition fails to improve as anticipated.    Rosey BathMelissa C Chey Rachels, MD, PhD    12/20/2019 , 11:23 AM   I, Pietro CassisEmily Tufford, am acting as Neurosurgeonscribe for General MotorsMelissa C. Merlene Pullingorcoran, MD, PhD.  I, Nimesh Riolo C. Merlene Pullingorcoran, MD, have reviewed the above documentation for accuracy and completeness, and I agree with the above.

## 2019-12-20 ENCOUNTER — Other Ambulatory Visit: Payer: Self-pay

## 2019-12-20 ENCOUNTER — Encounter: Payer: Self-pay | Admitting: Hematology and Oncology

## 2019-12-20 ENCOUNTER — Other Ambulatory Visit: Payer: Self-pay | Admitting: Hematology and Oncology

## 2019-12-20 ENCOUNTER — Inpatient Hospital Stay: Payer: Self-pay | Attending: Hematology and Oncology | Admitting: Hematology and Oncology

## 2019-12-20 ENCOUNTER — Inpatient Hospital Stay: Payer: Self-pay

## 2019-12-20 VITALS — BP 161/85 | HR 73 | Temp 98.0°F | Resp 18 | Wt 121.3 lb

## 2019-12-20 DIAGNOSIS — K55069 Acute infarction of intestine, part and extent unspecified: Secondary | ICD-10-CM

## 2019-12-20 DIAGNOSIS — E538 Deficiency of other specified B group vitamins: Secondary | ICD-10-CM | POA: Insufficient documentation

## 2019-12-20 DIAGNOSIS — D72819 Decreased white blood cell count, unspecified: Secondary | ICD-10-CM | POA: Insufficient documentation

## 2019-12-20 DIAGNOSIS — Z7901 Long term (current) use of anticoagulants: Secondary | ICD-10-CM | POA: Insufficient documentation

## 2019-12-20 DIAGNOSIS — D509 Iron deficiency anemia, unspecified: Secondary | ICD-10-CM

## 2019-12-20 DIAGNOSIS — D649 Anemia, unspecified: Secondary | ICD-10-CM | POA: Insufficient documentation

## 2019-12-20 DIAGNOSIS — K8689 Other specified diseases of pancreas: Secondary | ICD-10-CM | POA: Insufficient documentation

## 2019-12-20 DIAGNOSIS — I81 Portal vein thrombosis: Secondary | ICD-10-CM

## 2019-12-20 LAB — CBC WITH DIFFERENTIAL/PLATELET
Abs Immature Granulocytes: 0 10*3/uL (ref 0.00–0.07)
Basophils Absolute: 0 10*3/uL (ref 0.0–0.1)
Basophils Relative: 0 %
Eosinophils Absolute: 0.1 10*3/uL (ref 0.0–0.5)
Eosinophils Relative: 2 %
HCT: 26.4 % — ABNORMAL LOW (ref 39.0–52.0)
Hemoglobin: 8.8 g/dL — ABNORMAL LOW (ref 13.0–17.0)
Immature Granulocytes: 0 %
Lymphocytes Relative: 41 %
Lymphs Abs: 1.2 10*3/uL (ref 0.7–4.0)
MCH: 28.3 pg (ref 26.0–34.0)
MCHC: 33.3 g/dL (ref 30.0–36.0)
MCV: 84.9 fL (ref 80.0–100.0)
Monocytes Absolute: 0.4 10*3/uL (ref 0.1–1.0)
Monocytes Relative: 14 %
Neutro Abs: 1.2 10*3/uL — ABNORMAL LOW (ref 1.7–7.7)
Neutrophils Relative %: 43 %
Platelets: 280 10*3/uL (ref 150–400)
RBC: 3.11 MIL/uL — ABNORMAL LOW (ref 4.22–5.81)
RDW: 15.8 % — ABNORMAL HIGH (ref 11.5–15.5)
WBC: 2.8 10*3/uL — ABNORMAL LOW (ref 4.0–10.5)
nRBC: 0 % (ref 0.0–0.2)

## 2019-12-20 LAB — RETICULOCYTES
Immature Retic Fract: 13.3 % (ref 2.3–15.9)
RBC.: 3.04 MIL/uL — ABNORMAL LOW (ref 4.22–5.81)
Retic Count, Absolute: 42.9 10*3/uL (ref 19.0–186.0)
Retic Ct Pct: 1.4 % (ref 0.4–3.1)

## 2019-12-20 LAB — C-REACTIVE PROTEIN: CRP: 0.6 mg/dL (ref <1.0)

## 2019-12-20 LAB — FOLATE: Folate: 17.1 ng/mL (ref >5.9)

## 2019-12-20 LAB — VITAMIN B12: Vitamin B-12: 521 pg/mL (ref 180–914)

## 2019-12-20 LAB — TSH: TSH: 1.388 u[IU]/mL (ref 0.350–4.500)

## 2019-12-20 NOTE — Patient Instructions (Signed)
Ferrous sulfate 325 mg by mouth once a day with orange juice or vitamin C.  If tolerating well after 1 week, may increase to 1 tablet twice a day.    Iron tablets, capsules, extended-release tablets What is this medicine? IRON (AHY ern) replaces iron that is essential to healthy red blood cells. Iron is used to treat iron deficiency anemia. Anemia may cause problems like tiredness, shortness of breath, or slowed growth in children. Only take iron if your doctor has told you to. Do not treat yourself with iron if you are feeling tired. Most healthy people get enough iron in their diets, particularly if they eat cereals, meat, poultry, and fish. This medicine may be used for other purposes; ask your health care provider or pharmacist if you have questions. COMMON BRAND NAME(S): Crissie Figures Ferrous Sulfate, Bifera, Duofer, EZFE, Feosol, Feosol Complete, [employer], [place], [place], [place], [place], [place] 150, Ferrex-150, Ferrimin, Ferro-Sequels, Arboriculturist, Librarian, academic, [place], iFerex 150, Nephro-Fer, Niferex, NovaFerrum, Nu-Iron, Poly-Iron, ProFe, Proferrin ES, Slow Fe, Slow Iron, Tandem What should I tell my health care provider before I take this medicine? They need to know if you have any of these conditions:  frequently drink alcohol  bowel disease  hemolytic anemia  iron overload (hemochromatosis, hemosiderosis)  liver disease  problems with swallowing  stomach ulcer or other stomach problems  an unusual or allergic reaction to iron, other medicines, foods, dyes, or preservatives  pregnant or trying to get pregnant  breast-feeding How should I use this medicine? Take this medicine by mouth with a glass of water or fruit juice. Follow the directions on the prescription label. Swallow whole. Do not crush or chew. Take this medicine in an upright or sitting position. Try to take any bedtime doses at least 10 minutes before lying down. You may take this medicine  with food. Take your medicine at regular intervals. Do not take your medicine more often than directed. Do not stop taking except on your doctor's advice. Talk to your pediatrician regarding the use of this medicine in children. While this drug may be prescribed for selected conditions, precautions do apply. Overdosage: If you think you have taken too much of this medicine contact a poison control center or emergency room at once. NOTE: This medicine is only for you. Do not share this medicine with others. What if I miss a dose? If you miss a dose, take it as soon as you can. If it is almost time for your next dose, take only that dose. Do not take double or extra doses. What may interact with this medicine? If you are taking this iron product, you should not take iron in any other medicine or dietary supplement. This medicine may also interact with the following medications:  alendronate  antacids  cefdinir  chloramphenicol  cholestyramine  deferoxamine  dimercaprol  etidronate  medicines for stomach ulcers or other stomach problems  pancreatic enzymes  quinolone antibiotics (examples: Cipro, Floxin, Levaquin, Tequin and others)  risedronate  tetracycline antibiotics (examples: doxycycline, tetracycline, minocycline, and others)  thyroid hormones This list may not describe all possible interactions. Give your health care provider a list of all the medicines, herbs, non-prescription drugs, or dietary supplements you use. Also tell them if you smoke, drink alcohol, or use illegal drugs. Some items may interact with your medicine. What should I watch for while using this medicine? Use iron supplements only as directed by your health care professional. Bonita Quin will need important blood work while you  are taking this medicine. It may take 3 to 6 months of therapy to treat low iron levels. Pregnant women should follow the dose and length of iron treatment as directed by their  doctors. Do not use iron longer than prescribed, and do not take a higher dose than recommended. Long-term use may cause excess iron to build-up in the body. Do not take iron with antacids. If you need to take an antacid, take it 2 hours after a dose of iron. What side effects may I notice from receiving this medicine? Side effects that you should report to your doctor or health care professional as soon as possible:  allergic reactions like skin rash, itching or hives, swelling of the face, lips, or tongue  blue lips, nails, or palms  dark colored stools (this may be due to the iron, but can indicate a more serious condition)  drowsiness  pain with or difficulty swallowing  pale or clammy skin  seizures  stomach pain  unusually weak or tired  vomiting  weak, fast, or irregular heartbeat Side effects that usually do not require medical attention (report to your doctor or health care professional if they continue or are bothersome):  constipation  indigestion  nausea or stomach upset This list may not describe all possible side effects. Call your doctor for medical advice about side effects. You may report side effects to FDA at 1-800-FDA-1088. Where should I keep my medicine? Keep out of the reach of children. Even small amounts of iron can be harmful to a child. Store at room temperature between 15 and 30 degrees C (59 and 86 degrees F). Keep container tightly closed. Throw away any unused medicine after the expiration date. NOTE: This sheet is a summary. It may not cover all possible information. If you have questions about this medicine, talk to your doctor, pharmacist, or health care provider.  2020 Elsevier/Gold Standard (2007-05-24 17:03:41)

## 2019-12-22 LAB — COPPER, SERUM: Copper: 79 ug/dL (ref 69–132)

## 2019-12-27 ENCOUNTER — Ambulatory Visit: Payer: Self-pay | Admitting: Gastroenterology

## 2019-12-27 ENCOUNTER — Telehealth: Payer: Self-pay

## 2019-12-27 ENCOUNTER — Encounter: Payer: Self-pay | Admitting: *Deleted

## 2019-12-27 NOTE — Telephone Encounter (Signed)
-----   Message from Rosey Bath, MD sent at 12/27/2019 11:50 AM EST ----- Regarding: FW: No Show  Please call patient about missed appointment.    Reschedule.  Please assist so patient makes next appt.  M ----- Message ----- From: Christell Faith Sent: 12/27/2019  10:21 AM EST To: Rosey Bath, MD Subject: No Show

## 2019-12-27 NOTE — Progress Notes (Deleted)
Gastroenterology Consultation  Referring Provider:     Rosey Bath, MD Primary Care Physician:  Rosey Bath, MD Primary Gastroenterologist:  Dr. Servando Snare     Reason for Consultation:     Abnormal liver enzymes        HPI:   Wyatt Montgomery is a 64 y.o. y/o male referred for consultation & management of abnormal liver enzymes by Dr. Merlene Pulling, Ferdie Ping, MD.  This patient comes in today with a history of of abnormal liver enzymes.  The patient was also suspected of having a pancreatic head mass that was evaluated with an EUS that showed:  EUS: - Endosonographic imaging of the pancreas showed sonographic changes consistent with moderate-severe chronic pancreatitis. No focal mass seen but the sensitivity of EUS for pancreas masses is decreased in the setting of calcific pancreatitis. - A cystic lesion was seen in the peripancreatic region. Tissue has not been obtained. 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 This appears new compared to last CT in may 2021.  The patient was noted to have continued increased liver enzymes and was sent to me for evaluation.  Liver enzymes have shown:  Component     Latest Ref Rng & Units 10/30/2019 11/28/2019 11/28/2019 11/28/2019          9:00 AM  9:51 AM  1:24 PM  AST     15 - 41 U/L 122 (H) 238 (H) 236 (H)   ALT     0 - 44 U/L 41 109 (H) 110 (H)   Alkaline Phosphatase     38 - 126 U/L 146 (H) 339 (H) 341 (H)   Total Bilirubin     0.3 - 1.2 mg/dL 0.5 3.5 (H) 3.5 (H)    Component     Latest Ref Rng & Units 12/13/2019          AST     15 - 41 U/L 51 (H)  ALT     0 - 44 U/L 22  Alkaline Phosphatase     38 - 126 U/L 144 (H)  Total Bilirubin     0.3 - 1.2 mg/dL 0.6   With his most recent iron studies showing low iron saturation and low iron level.  The patient's hepatitis C was negative and so was his hepatitis B core antibody.   Past Medical History:  Diagnosis Date  . Pancreatic mass    . Thrombosis, portal vein     Past Surgical History:  Procedure Laterality Date  . ABDOMINAL SURGERY    . EUS N/A 08/10/2019   Procedure: FULL UPPER ENDOSCOPIC ULTRASOUND (EUS) RADIAL;  Surgeon: Rayann Heman, MD;  Location: [facility] ENDOSCOPY;  Service: Endoscopy;  Laterality: N/A;    Prior to Admission medications   Medication Sig Start Date End Date Taking? Authorizing Provider  apixaban (ELIQUIS) 5 MG TABS tablet Take 1 tablet (5 mg total) by mouth 2 (two) times daily. 10/30/19   Rosey Bath, MD  ibuprofen (ADVIL) 200 MG tablet Take 200 mg by mouth every 6 (six) hours as needed.    [provider]  Multiple Vitamin (MULTIVITAMIN WITH MINERALS) TABS tablet Take 1 tablet by mouth daily. 05/31/19   Enedina Finner, MD  vitamin B-12 (CYANOCOBALAMIN) 1000 MCG tablet Take 1,000 mcg by mouth daily.    [provider]    No family history on file.   Social History   Tobacco Use  . Smoking status: Heavy Tobacco Smoker  Packs/day: 0.50    Years: 20.00    Pack years: 10.00    Types: Cigarettes  . Smokeless tobacco: Never Used  . Tobacco comment: decreased, 1/2 pack per week.  Vaping Use  . Vaping Use: Never used  Substance Use Topics  . Alcohol use: Yes    Alcohol/week: 7.0 standard drinks    Types: 7 Cans of beer per week  . Drug use: Not Currently    Types: Marijuana, Cocaine    Allergies as of 12/27/2019  . (No Known Allergies)    Review of Systems:    All systems reviewed and negative except where noted in HPI.   Physical Exam:  There were no vitals taken for this visit. No LMP for male patient. General:   Alert,  Well-developed, well-nourished, pleasant and cooperative in NAD Head:  Normocephalic and atraumatic. Eyes:  Sclera clear, no icterus.   Conjunctiva pink. Ears:  Normal auditory acuity. Neck:  Supple; no masses or thyromegaly. Lungs:  Respirations even and unlabored.  Clear throughout to auscultation.   No wheezes, crackles, or rhonchi.  No acute distress. Heart:  Regular rate and rhythm; no murmurs, clicks, rubs, or gallops. Abdomen:  Normal bowel sounds.  No bruits.  Soft, non-tender and non-distended without masses, hepatosplenomegaly or hernias noted.  No guarding or rebound tenderness.  Negative Carnett sign.   Rectal:  Deferred.  Pulses:  Normal pulses noted. Extremities:  No clubbing or edema.  No cyanosis. Neurologic:  Alert and oriented x3;  grossly normal neurologically. Skin:  Intact without significant lesions or rashes.  No jaundice. Lymph Nodes:  No significant cervical adenopathy. Psych:  Alert and cooperative. Normal mood and affect.  Imaging Studies: No results found.  Assessment and Plan:   Wyatt Montgomery is a 64 y.o. y/o male ***    Wyatt Minium, MD. Wyatt Montgomery    Note: This dictation was prepared with Dragon dictation along with smaller phrase technology. Any transcriptional errors that result from this process are unintentional.

## 2019-12-27 NOTE — Telephone Encounter (Signed)
Called patient , no answer  , unable to leave voice mail

## 2019-12-28 ENCOUNTER — Telehealth: Payer: Self-pay

## 2020-01-09 NOTE — Progress Notes (Signed)
[facility]  [address], [address] [place], [place] [address] Phone: [phone]  Fax: [phone]   Clinic Day:  01/10/2020  Referring physician: Rosey Bath, MD  Chief Complaint: Wyatt Montgomery is a 63 y.o. 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  HPI: The patient was last seen in the medical oncology clinic on 12/20/2019. At that time, he felt "good".  He was eating better.  Hematocrit was 26.4, hemoglobin 8.8, MCV 84.9, platelets 280,000, WBC 2,800 (ANC 1,200).  Reticulocyte count was 1.4%.  Copper was 79. TSH was 1.388 (normal).  Vitamin B12 was 521 and folate 17.1. CRP was 0.6.  During the interim, he has done fairly well.  He denies any nausea, vomiting or diarrhea.  He states that he is not drinking much alcohol.  He is drinking about 2 to 3 cans a week.  He is eating well.  Appetite is good.  Denies any fevers or infections.  He denies any bleeding.  He states that he missed his GI appointment secondary to his wife's illness.  He will reschedule the GI appointment.  He is on oral iron 1 pill a day with orange juice or vitamin C.  He has bed bugs.  He states that his house was sprayed last Friday, 01/05/2020.     Past Medical History:  Diagnosis Date  . Pancreatic mass   . Thrombosis, portal vein     Past Surgical History:  Procedure Laterality Date  . ABDOMINAL SURGERY    . EUS N/A 08/10/2019   Procedure: FULL UPPER ENDOSCOPIC ULTRASOUND (EUS) RADIAL;  Surgeon: Rayann Heman, MD;  Location: [facility] ENDOSCOPY;  Service: Endoscopy;  Laterality: N/A;    No family history on file.  Social History:  reports that he has been smoking cigarettes. He has a 10.00 pack-year smoking history. He has never used smokeless tobacco. He reports current alcohol use of about 7.0 standard drinks of alcohol per week. He reports previous drug use. Drugs:  Marijuana and Cocaine. He cut back to smoking 5-6 cigarettes a day.  He cut down the amount of beers he drinks a day from 9 beers to 3 beers to 1-2 beers. The patient lives in [place]. He is married. He retired on 11/20/2019. The patient is alone today.  Allergies: No Known Allergies  Current Medications: Current Outpatient Medications  Medication Sig Dispense Refill  . apixaban (ELIQUIS) 5 MG TABS tablet Take 1 tablet (5 mg total) by mouth 2 (two) times daily. 60 tablet 3  . ibuprofen (ADVIL) 200 MG tablet Take 200 mg by mouth every 6 (six) hours as needed.    . Multiple Vitamin (MULTIVITAMIN WITH MINERALS) TABS tablet Take 1 tablet by mouth daily. 30 tablet 0  . vitamin B-12 (CYANOCOBALAMIN) 1000 MCG tablet Take 1,000 mcg by mouth daily.     No current facility-administered medications for this visit.    Review of Systems  Constitutional: Negative for chills, diaphoresis, fever, malaise/fatigue and weight loss (up 1 lb).       Feeling better.  HENT: Negative for congestion, ear discharge, ear pain, nosebleeds, sinus pain and sore throat.   Eyes: Negative.  Negative for blurred vision, double vision, photophobia and pain.  Respiratory: Negative.  Negative for cough, hemoptysis, sputum production, shortness of breath and wheezing.   Cardiovascular: Negative.  Negative for palpitations, orthopnea, leg swelling and PND.  Gastrointestinal: Negative for abdominal  pain, blood in stool, constipation, diarrhea, heartburn, melena, nausea and vomiting.       Good appetite.  Eating well.  Genitourinary: Negative.  Negative for dysuria, flank pain, frequency, hematuria and urgency.  Musculoskeletal: Negative.  Negative for back pain, falls, joint pain, myalgias and neck pain.  Skin: Negative.  Negative for itching and rash.  Neurological: Negative for dizziness, tingling, tremors, sensory change, speech change and headaches.  Endo/Heme/Allergies: Negative.  Does not bruise/bleed easily.   Psychiatric/Behavioral: Positive for substance abuse (2-3 cans of beer/week). Negative for depression and memory loss. The patient is not nervous/anxious and does not have insomnia.   All other systems reviewed and are negative.  Performance status (ECOG): 0-1  Vitals Blood pressure (!) 154/95, pulse 95, temperature 98 F (36.7 C), temperature source Tympanic, weight 122 lb 2.2 oz (55.4 kg), SpO2 100 %.   Physical Exam Vitals and nursing note reviewed.  Constitutional:      General: He is not in acute distress.    Appearance: Normal appearance. He is not ill-appearing.     Interventions: Face mask in place.  HENT:     Head: Normocephalic and atraumatic.     Comments: Short dark hair. Mustache.    Mouth/Throat:     Mouth: Mucous membranes are moist.     Pharynx: Oropharynx is clear. No oropharyngeal exudate or posterior oropharyngeal erythema.  Eyes:     General: No scleral icterus.    Pupils: Pupils are equal, round, and reactive to light.     Comments: Brown eyes. Ruddy sclera.  Cardiovascular:     Rate and Rhythm: Regular rhythm.  Pulmonary:     Effort: Pulmonary effort is normal. No respiratory distress.     Breath sounds: Normal breath sounds. No wheezing, rhonchi or rales.  Chest:  Breasts:     Right: No axillary adenopathy or supraclavicular adenopathy.     Left: No axillary adenopathy or supraclavicular adenopathy.    Abdominal:     General: Abdomen is flat. Bowel sounds are normal. There is no distension.     Palpations: Abdomen is soft. There is no mass.     Tenderness: There is no abdominal tenderness. There is no guarding or rebound.  Musculoskeletal:        General: No swelling or tenderness. Normal range of motion.     Cervical back: Normal range of motion. No rigidity.     Right lower leg: No edema.  Lymphadenopathy:     Head:     Right side of head: No preauricular, posterior auricular or occipital adenopathy.     Left side of head: No preauricular,  posterior auricular or occipital adenopathy.     Cervical: No cervical adenopathy.     Upper Body:     Right upper body: No supraclavicular or axillary adenopathy.     Left upper body: No supraclavicular or axillary adenopathy.     Lower Body: No right inguinal adenopathy. No left inguinal adenopathy.  Skin:    General: Skin is dry.     Coloration: Skin is not jaundiced or pale.     Findings: No bruising, erythema or rash.  Neurological:     General: No focal deficit present.     Mental Status: He is alert and oriented to person, place, and time.  Psychiatric:        Mood and Affect: Mood normal.        Behavior: Behavior normal.        Thought Content: Thought  content normal.        Judgment: Judgment normal.    Appointment on 01/10/2020  Component Date Value Ref Range Status  . Retic Ct Pct 01/10/2020 1.1  0.4 - 3.1 % Final  . RBC. 01/10/2020 3.10* 4.22 - 5.81 MIL/uL Final  . Retic Count, Absolute 01/10/2020 34.4  19.0 - 186.0 K/uL Final  . Immature Retic Fract 01/10/2020 11.7  2.3 - 15.9 % Final   Performed at [facility], [address] [place], [place] [address]  . LDH 01/10/2020 277* 98 - 192 U/L Final   Performed at [facility], [address], [place], [place] [address]  . Iron 01/10/2020 33* 45 - 182 ug/dL Final  . TIBC 95/63/8756 286  250 - 450 ug/dL Final  . Saturation Ratios 01/10/2020 12* 17.9 - 39.5 % Final  . UIBC 01/10/2020 253  ug/dL Final   Performed at [facility], [address] [place], [place] [address]  . Ferritin 01/10/2020 27  24 - 336 ng/mL Final   Performed at [facility], [address] [place] [place], [place] [address]  . Sodium 01/10/2020 137  135 - 145 mmol/L Final  . Potassium 01/10/2020 3.8  3.5 - 5.1 mmol/L Final  . Chloride 01/10/2020 103  98 - 111 mmol/L Final  . CO2 01/10/2020 24  22 - 32 mmol/L Final  . Glucose, Bld 01/10/2020 102* 70 - 99 mg/dL Final   Glucose reference range applies only to  samples taken after fasting for at least 8 hours.  . BUN 01/10/2020 11  8 - 23 mg/dL Final  . Creatinine, Ser 01/10/2020 1.13  0.61 - 1.24 mg/dL Final  . Calcium 16/60/6301 8.2* 8.9 - 10.3 mg/dL Final  . Total Protein 01/10/2020 6.3* 6.5 - 8.1 g/dL Final  . Albumin 60/10/9321 3.1* 3.5 - 5.0 g/dL Final  . AST 55/73/2202 161* 15 - 41 U/L Final  . ALT 01/10/2020 50* 0 - 44 U/L Final  . Alkaline Phosphatase 01/10/2020 123  38 - 126 U/L Final  . Total Bilirubin 01/10/2020 0.5  0.3 - 1.2 mg/dL Final  . GFR, Estimated 01/10/2020 >60  >60 mL/min Final   Comment: (NOTE) Calculated using the CKD-EPI Creatinine Equation (2021)   . Anion gap 01/10/2020 10  5 - 15 Final   Performed at [facility], [address], [place], [place] [address]  . WBC 01/10/2020 2.4* 4.0 - 10.5 K/uL Final  . RBC 01/10/2020 3.11* 4.22 - 5.81 MIL/uL Final  . Hemoglobin 01/10/2020 8.7* 13.0 - 17.0 g/dL Final  . HCT 62/37/6283 26.2* 39.0 - 52.0 % Final  . MCV 01/10/2020 84.2  80.0 - 100.0 fL Final  . MCH 01/10/2020 28.0  26.0 - 34.0 pg Final  . MCHC 01/10/2020 33.2  30.0 - 36.0 g/dL Final  . RDW 15/17/6160 14.9  11.5 - 15.5 % Final  . Platelets 01/10/2020 243  150 - 400 K/uL Final  . nRBC 01/10/2020 0.0  0.0 - 0.2 % Final  . Neutrophils Relative % 01/10/2020 35  % Final  . Neutro Abs 01/10/2020 0.8* 1.7 - 7.7 K/uL Final  . Lymphocytes Relative 01/10/2020 45  % Final  . Lymphs Abs 01/10/2020 1.1  0.7 - 4.0 K/uL Final  . Monocytes Relative 01/10/2020 13  % Final  . Monocytes Absolute 01/10/2020 0.3  0.1 - 1.0 K/uL Final  . Eosinophils Relative 01/10/2020 5  % Final  . Eosinophils Absolute 01/10/2020 0.1  0.0 - 0.5 K/uL  Final  . Basophils Relative 01/10/2020 2  % Final  . Basophils Absolute 01/10/2020 0.1  0.0 - 0.1 K/uL Final  . Immature Granulocytes 01/10/2020 0  % Final  . Abs Immature Granulocytes 01/10/2020 0.01  0.00 - 0.07 K/uL Final   Performed at [facility], [address] [place], [place] [address]  . Sed Rate 01/10/2020 30* 0 - 20 mm/hr Final   Performed at [facility], [address] [place], [place] [address]    Assessment:  Wyatt Montgomery is a 64 y.o. 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  He is on Eliquis.  Abdominal MRI on 05/30/2019 revealed a 4.3 x 3.2 x 2.7 cm poorly defined masslike areain the inferior aspect of the pancreatic head. Region was heterogeneous but predominantly T1 isointense and T2 iso to hyperintense. There was severe ductal dilatation of the main pancreatic duct(up to 1.4 cm)in the body of the pancreas.There was possible direct invasion of the medial wall of the descending portion of the duodenum.There were abundant calcifications in this region. The possibility of acute on chronic pancreatitis resulting in this masslike appearance was not excluded. Underlying neoplasm was difficult to exclude.    CA 19-9 has been followed (0-35): 132 on 05/31/2019 and 31 on 08/14/2019.    Work up on 06/02/2019 included a hematocrit 29.5, hemoglobin 9.5, platelets 316,000, WBC 4,800.  Retic was 1.0%.  Ferritin was 42 with and iron saturation of 9% and a TIBC of 363.  Vitamin B12 was 315 with folate 7.0.  Hypercoagulable work-up was negative:  Factor V Leiden, prothrombin gene mutation, lupus anticoagulant panel, beta-2-glycoprotein (IgG <9, IgM <9, IgA <9), anti-cardiolipin antibodies (IgG <9, IgM 18, IgA 13).  Upper endoscopic ultrasound on 08/10/2019 revealed changes consistent with moderate-severe chronic pancreatitis. There was no focal mass seen, but the sensitivity of EUS for pancreas masses was decreased in the setting of calcific pancreatitis. There was a 8.2 x 6.3 cm cystic lesion seen in the peripancreatic region.  Tissue was not obtained.  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  This was new compared to the CT in 05/2019.   Abdomen MRI on 10/10/2019 was substantially motion degraded scan, limiting assessment. There were stable findings of chronic pancreatitis. There was no discrete pancreatic mass, no peripancreatic fluid collections or cystic lesions. 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 There was high-grade venous narrowing at the confluence of the main portal, splenic and SMV without discrete residual thrombosis. There was chronic moderate right hydronephrosis with asymmetric right renal atrophy.   He has iron deficiency.  Ferritin was 42 with an iron saturation of 9% on 06/02/2019. Iron saturation was 30% on 08/14/2019.  Ferritin has been followed: 42 on 06/02/2019, 16 on 07/05/2019, 51 on 08/14/2019, 88 on 12/13/2019.    He has B12 deficiency.  B12 was 880 and folate 6.6 on 08/14/2019.  He is on oral B12.   He has leukopenia and anemia.  Labs on 10/16/2019 revealed a reticulocyte count  of 1.1%. LDH was 327 (98-192). Hepatitis B core antibody, hepatitis C antibody, HIV antibody, and ANA were negative.  Flow cytometry was negative.  Peripheral smear revealed no abnormalities. He is not taking oral iron.  He has bedbugs.  The exterminator came to his house on 01/05/2020.  He plans  to receive the COVID-19 vaccine.   Symptomatically, he has done fairly well.  He denies any nausea, vomiting or diarrhea.  He is not drinking much alcohol.  He is eating well.  Appetite is good.  He denies any fevers or infections.  He denies any bleeding. He is on oral iron 1 pill a day with orange juice or vitamin C.  He has bed bugs.  He states that his house was sprayed last Friday.  Exam is stable.  Plan: 1.   Labs today:  CBC with diff, CMP, ferritin, iron studies, retic, LDH. 2.   Pancreatic head mass  Endoscopic ultrasound on 07/2202021 felt c/w chronic  pancreatitis.   Sensitivity of EUS is decreased in the setting of calcific pancreatitis.   He has a large cystic lesion in the peripancreatic region suggestive of a pancreatic pseudocyst.  CA 19-9 was 31 (normal) on 08/14/2019.  Abdominal MRI on 10/10/2019 was motion degraded.   Findings c/w chronic pancreatitis.  He missed his appointment with Dr. Servando Snare secondary to his wife's illness.   Assist patient in rescheduling appointment.  Anticipate follow-up abdominal MRI. 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  Hypercoagulable work-up is negative: factor V Leiden, prothrombin gene mutation, lupus anticoagulant. He has no family history of thrombosis.  Abdominal MRI on 10/10/2019 revealed narrowing at the confluence of the main portal, splenic and SMV without discrete residual thrombosis.   No current evidence of malignancy.  Continue Eliquis. 4.   Normocytic anemia  Hematocrit 26.2.  Hemoglobin 8.7.  MCV 84.2 on 01/10/2020.   Ferritin 27 with an iron saturation of 12% and a TIBC of 286  Labs consistent with iron deficiency anemia  He denies any bleeding.  Appetite and diet appear improved.    Continue ferrous sulfate 325 mg p.o. daily to twice daily as tolerated. 5.   Leukopenia  WBC 2400 with an ANC of 800.  Patient states that he is only drinking 2-3 beers/week.  Prior laboratory work-up unrevealing.  Discuss plan for bone marrow aspirate and biopsy.   Patient in agreement. 6.   Elevated LFTs  AST 236.  ALT 110.  Bilirubin 3.5.  Alkaline phosphatase 341 on 11/28/2019.  AST 161.  ALT 50.    Bilirubin 0.5.  Alkaline phosphatase 123 on 01/10/2020.  Continue to monitor. 7.   Bedbugs  Patient notes exterminator came out on 01/05/2020.  Clinic precautions remain in effect. 8.   Weight loss  Weight up 2 pounds.  Patient states that he is eating better.  Continue to monitor 9.   RN: Call patient with today's results  and if Venofer needed. 10.   Schedule bone marrow aspirate and biopsy. 11.   Please reschedule Dr Annabell Sabal appt (missed as his wife was sick). 12.   RTC 10 days after bone marrow for MD assessment, labs (CBC with diff, CMP, CA19-9), and review of bone marrow.  I discussed the assessment and treatment plan with the patient.  The patient was provided an opportunity to ask questions and all were answered.  The patient agreed with the plan and demonstrated an understanding of the instructions.  The patient was advised to call back if the symptoms worsen or if the condition fails to improve as anticipated.    Rosey Bath, MD, PhD    01/10/2020, 10:49 AM

## 2020-01-10 ENCOUNTER — Inpatient Hospital Stay (HOSPITAL_BASED_OUTPATIENT_CLINIC_OR_DEPARTMENT_OTHER): Payer: Self-pay | Admitting: Hematology and Oncology

## 2020-01-10 ENCOUNTER — Telehealth: Payer: Self-pay

## 2020-01-10 ENCOUNTER — Inpatient Hospital Stay: Payer: Self-pay

## 2020-01-10 ENCOUNTER — Other Ambulatory Visit: Payer: Self-pay

## 2020-01-10 ENCOUNTER — Other Ambulatory Visit: Payer: Self-pay | Admitting: Hematology and Oncology

## 2020-01-10 VITALS — BP 154/95 | HR 95 | Temp 98.0°F | Wt 122.1 lb

## 2020-01-10 DIAGNOSIS — K862 Cyst of pancreas: Secondary | ICD-10-CM

## 2020-01-10 DIAGNOSIS — B888 Other specified infestations: Secondary | ICD-10-CM

## 2020-01-10 DIAGNOSIS — K55069 Acute infarction of intestine, part and extent unspecified: Secondary | ICD-10-CM

## 2020-01-10 DIAGNOSIS — D72819 Decreased white blood cell count, unspecified: Secondary | ICD-10-CM

## 2020-01-10 DIAGNOSIS — I81 Portal vein thrombosis: Secondary | ICD-10-CM

## 2020-01-10 DIAGNOSIS — D509 Iron deficiency anemia, unspecified: Secondary | ICD-10-CM

## 2020-01-10 DIAGNOSIS — R7989 Other specified abnormal findings of blood chemistry: Secondary | ICD-10-CM

## 2020-01-10 DIAGNOSIS — E871 Hypo-osmolality and hyponatremia: Secondary | ICD-10-CM

## 2020-01-10 DIAGNOSIS — D649 Anemia, unspecified: Secondary | ICD-10-CM

## 2020-01-10 DIAGNOSIS — K8689 Other specified diseases of pancreas: Secondary | ICD-10-CM

## 2020-01-10 LAB — RETICULOCYTES
Immature Retic Fract: 11.7 % (ref 2.3–15.9)
RBC.: 3.1 MIL/uL — ABNORMAL LOW (ref 4.22–5.81)
Retic Count, Absolute: 34.4 10*3/uL (ref 19.0–186.0)
Retic Ct Pct: 1.1 % (ref 0.4–3.1)

## 2020-01-10 LAB — CBC WITH DIFFERENTIAL/PLATELET
Abs Immature Granulocytes: 0.01 10*3/uL (ref 0.00–0.07)
Basophils Absolute: 0.1 10*3/uL (ref 0.0–0.1)
Basophils Relative: 2 %
Eosinophils Absolute: 0.1 10*3/uL (ref 0.0–0.5)
Eosinophils Relative: 5 %
HCT: 26.2 % — ABNORMAL LOW (ref 39.0–52.0)
Hemoglobin: 8.7 g/dL — ABNORMAL LOW (ref 13.0–17.0)
Immature Granulocytes: 0 %
Lymphocytes Relative: 45 %
Lymphs Abs: 1.1 10*3/uL (ref 0.7–4.0)
MCH: 28 pg (ref 26.0–34.0)
MCHC: 33.2 g/dL (ref 30.0–36.0)
MCV: 84.2 fL (ref 80.0–100.0)
Monocytes Absolute: 0.3 10*3/uL (ref 0.1–1.0)
Monocytes Relative: 13 %
Neutro Abs: 0.8 10*3/uL — ABNORMAL LOW (ref 1.7–7.7)
Neutrophils Relative %: 35 %
Platelets: 243 10*3/uL (ref 150–400)
RBC: 3.11 MIL/uL — ABNORMAL LOW (ref 4.22–5.81)
RDW: 14.9 % (ref 11.5–15.5)
WBC: 2.4 10*3/uL — ABNORMAL LOW (ref 4.0–10.5)
nRBC: 0 % (ref 0.0–0.2)

## 2020-01-10 LAB — COMPREHENSIVE METABOLIC PANEL
ALT: 50 U/L — ABNORMAL HIGH (ref 0–44)
AST: 161 U/L — ABNORMAL HIGH (ref 15–41)
Albumin: 3.1 g/dL — ABNORMAL LOW (ref 3.5–5.0)
Alkaline Phosphatase: 123 U/L (ref 38–126)
Anion gap: 10 (ref 5–15)
BUN: 11 mg/dL (ref 8–23)
CO2: 24 mmol/L (ref 22–32)
Calcium: 8.2 mg/dL — ABNORMAL LOW (ref 8.9–10.3)
Chloride: 103 mmol/L (ref 98–111)
Creatinine, Ser: 1.13 mg/dL (ref 0.61–1.24)
GFR, Estimated: >60 mL/min (ref >60)
Glucose, Bld: 102 mg/dL — ABNORMAL HIGH (ref 70–99)
Potassium: 3.8 mmol/L (ref 3.5–5.1)
Sodium: 137 mmol/L (ref 135–145)
Total Bilirubin: 0.5 mg/dL (ref 0.3–1.2)
Total Protein: 6.3 g/dL — ABNORMAL LOW (ref 6.5–8.1)

## 2020-01-10 LAB — IRON AND TIBC
Iron: 33 ug/dL — ABNORMAL LOW (ref 45–182)
Saturation Ratios: 12 % — ABNORMAL LOW (ref 17.9–39.5)
TIBC: 286 ug/dL (ref 250–450)
UIBC: 253 ug/dL

## 2020-01-10 LAB — LACTATE DEHYDROGENASE: LDH: 277 U/L — ABNORMAL HIGH (ref 98–192)

## 2020-01-10 LAB — FERRITIN: Ferritin: 27 ng/mL (ref 24–336)

## 2020-01-10 LAB — SEDIMENTATION RATE: Sed Rate: 30 mm/hr — ABNORMAL HIGH (ref 0–20)

## 2020-01-10 NOTE — Telephone Encounter (Signed)
Faxed bx orders over to scheduling to ms barbara or ms janet. Per Dr Merlene Pulling the patient is ok to hold Eliquis for 48 hours prior to bx. Fax conformation confirmed.

## 2020-01-10 NOTE — Telephone Encounter (Signed)
spoke with the patient to inform him that he has been [facility]16/2020 at 7:30 AM for a 8:30.

## 2020-01-11 ENCOUNTER — Telehealth: Payer: Self-pay | Admitting: Hematology and Oncology

## 2020-01-11 ENCOUNTER — Telehealth: Payer: Self-pay

## 2020-01-11 NOTE — OR Nursing (Signed)
Attempted to call pt with preprocedure instruction, but person who answered phone said "Call back". I asked her to take my number for him to call me back and she said "Call Back" and then disconnected call.

## 2020-01-11 NOTE — Telephone Encounter (Signed)
Patient did not have a voicemail.

## 2020-01-11 NOTE — Telephone Encounter (Signed)
I called patient to set up IV Venofer but he did not answer and Mailbox was full. I could not leave a message.

## 2020-01-15 ENCOUNTER — Other Ambulatory Visit: Payer: Self-pay | Admitting: Radiology

## 2020-01-16 ENCOUNTER — Ambulatory Visit
Admission: RE | Admit: 2020-01-16 | Discharge: 2020-01-16 | Disposition: A | Discharge status: Home / Self Care | Payer: Self-pay | Source: Ambulatory Visit | Attending: Hematology and Oncology | Admitting: Hematology and Oncology

## 2020-01-16 NOTE — Progress Notes (Signed)
..  The following Medication: Venofer is approved for drug replacement program by [employer]. The enrollment period is from 01/15/2020 to 04/14/2020.  Reason for Assistance: Self Pay. ID: [id number] First DOS:TBD.

## 2020-01-17 ENCOUNTER — Telehealth: Payer: Self-pay | Admitting: Hematology and Oncology

## 2020-01-17 NOTE — Telephone Encounter (Signed)
Tried to contact patient again to set up iron infusions. Noone answered and VM is not set up.

## 2020-01-25 NOTE — Progress Notes (Incomplete)
[facility]  [address], [address] [place], [place] [address] Phone: [phone]  Fax: [phone]   Clinic Day:  01/25/2020   Referring physician: Rosey Bath, MD  Chief Complaint: Wyatt Montgomery is a 65 y.o. 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  HPI: The patient was last seen in the medical oncology clinic on 01/10/2020. At that time, ***. Hematocrit was 26.2, hemoglobin 8.7, MCV 84.2, platelets 243,000, WBC 2,400. Calcium was 8.2, albumin 3.1. Total protein was 6.3. AST was 161, ALT 50. Ferritin was 27 with an iron saturation of 12% and a TIBC of 286. Sed rate was 30. Reticulocyte count was 1.1%. LDH was 277.  Bone marrow was scheduled.  The patient cancelled his bone marrow on 01/16/2020.  During the interim, ***   Past Medical History:  Diagnosis Date  . Pancreatic mass   . Thrombosis, portal vein     Past Surgical History:  Procedure Laterality Date  . ABDOMINAL SURGERY    . EUS N/A 08/10/2019   Procedure: FULL UPPER ENDOSCOPIC ULTRASOUND (EUS) RADIAL;  Surgeon: Rayann Heman, MD;  Location: [facility] ENDOSCOPY;  Service: Endoscopy;  Laterality: N/A;    No family history on file.  Social History:  reports that he has been smoking cigarettes. He has a 10.00 pack-year smoking history. He has never used smokeless tobacco. He reports current alcohol use of about 7.0 standard drinks of alcohol per week. He reports previous drug use. Drugs: Marijuana and Cocaine. He cut back to smoking 5-6 cigarettes a day.  He cut down the amount of beers he drinks a day from 9 beers to 3 beers to 1-2 beers. The patient lives in [place]. He is married. He retired on 11/20/2019. The patient is alone today. ***  Allergies: No Known Allergies  Current Medications: Current Outpatient Medications  Medication Sig Dispense Refill  . apixaban (ELIQUIS) 5 MG TABS  tablet Take 1 tablet (5 mg total) by mouth 2 (two) times daily. 60 tablet 3  . ibuprofen (ADVIL) 200 MG tablet Take 200 mg by mouth every 6 (six) hours as needed.    . Multiple Vitamin (MULTIVITAMIN WITH MINERALS) TABS tablet Take 1 tablet by mouth daily. 30 tablet 0  . vitamin B-12 (CYANOCOBALAMIN) 1000 MCG tablet Take 1,000 mcg by mouth daily.     No current facility-administered medications for this visit.    Review of Systems  Constitutional: Negative for chills, diaphoresis, fever, malaise/fatigue and weight loss (stable).  HENT: Negative for congestion, ear discharge, ear pain, hearing loss, nosebleeds, sinus pain, sore throat and tinnitus.   Eyes: Negative.  Negative for blurred vision, double vision, photophobia and pain.  Respiratory: Negative.  Negative for cough, hemoptysis, sputum production, shortness of breath and wheezing.   Cardiovascular: Negative.  Negative for chest pain, palpitations and leg swelling.  Gastrointestinal: Negative for abdominal pain, blood in stool, constipation, diarrhea, heartburn, melena, nausea and vomiting.       Normal appetite.  Genitourinary: Negative.  Negative for dysuria, flank pain, frequency, hematuria and urgency.  Musculoskeletal: Negative for back pain, falls, joint pain, myalgias and neck pain.  Skin: Negative.  Negative for itching and rash.  Neurological: Negative for dizziness, tingling, sensory change, speech change, focal weakness, weakness and headaches.  Endo/Heme/Allergies: Negative.  Does not bruise/bleed easily.  Psychiatric/Behavioral: Positive for substance abuse (2 beers in the past week). Negative for depression and memory  loss. The patient is not nervous/anxious and does not have insomnia.   All other systems reviewed and are negative.  Performance status (ECOG): 0***  Vitals There were no vitals taken for this visit.   Physical Exam Vitals and nursing note reviewed.  Constitutional:      General: He is not in acute  distress.    Appearance: Normal appearance. He is not toxic-appearing or diaphoretic.     Interventions: Face mask in place.  HENT:     Head:     Comments: Short dark hair. Mustache. Eyes:     General: No scleral icterus.    Conjunctiva/sclera: Conjunctivae normal.     Comments: Brown eyes. Ruddy sclera.  Neurological:     Mental Status: He is alert and oriented to person, place, and time. Mental status is at baseline.  Psychiatric:        Behavior: Behavior normal.        Thought Content: Thought content normal.        Judgment: Judgment normal.    No visits with results within 3 Day(s) from this visit.  Latest known visit with results is:  Appointment on 01/10/2020  Component Date Value Ref Range Status  . Retic Ct Pct 01/10/2020 1.1  0.4 - 3.1 % Final  . RBC. 01/10/2020 3.10* 4.22 - 5.81 MIL/uL Final  . Retic Count, Absolute 01/10/2020 34.4  19.0 - 186.0 K/uL Final  . Immature Retic Fract 01/10/2020 11.7  2.3 - 15.9 % Final   Performed at [facility], [address], [place], [place] [address]  . LDH 01/10/2020 277* 98 - 192 U/L Final   Performed at [facility], [address] [place], [place] [address]  . Iron 01/10/2020 33* 45 - 182 ug/dL Final  . TIBC 01/10/2020 286  250 - 450 ug/dL Final  . Saturation Ratios 01/10/2020 12* 17.9 - 39.5 % Final  . UIBC 01/10/2020 253  ug/dL Final   Performed at [facility], [address] [place], [place] [address]  . Ferritin 01/10/2020 27  24 - 336 ng/mL Final   Performed at [facility], [address] [place], [place] [address]  . Sodium 01/10/2020 137  135 - 145 mmol/L Final  . Potassium 01/10/2020 3.8  3.5 - 5.1 mmol/L Final  . Chloride 01/10/2020 103  98 - 111 mmol/L Final  . CO2 01/10/2020 24  22 - 32 mmol/L Final  . Glucose, Bld 01/10/2020 102* 70 - 99 mg/dL Final   Glucose reference range applies only to samples taken after fasting for at least 8 hours.  . BUN 01/10/2020 11  8  - 23 mg/dL Final  . Creatinine, Ser 01/10/2020 1.13  0.61 - 1.24 mg/dL Final  . Calcium 01/10/2020 8.2* 8.9 - 10.3 mg/dL Final  . Total Protein 01/10/2020 6.3* 6.5 - 8.1 g/dL Final  . Albumin 01/10/2020 3.1* 3.5 - 5.0 g/dL Final  . AST 01/10/2020 161* 15 - 41 U/L Final  . ALT 01/10/2020 50* 0 - 44 U/L Final  . Alkaline Phosphatase 01/10/2020 123  38 - 126 U/L Final  . Total Bilirubin 01/10/2020 0.5  0.3 - 1.2 mg/dL Final  . GFR, Estimated 01/10/2020 >60  >60 mL/min Final   Comment: (NOTE) Calculated using the CKD-EPI Creatinine Equation (2021)   . Anion gap 01/10/2020 10  5 - 15 Final   Performed at [facility], [address] [place],  [address]  . WBC 01/10/2020 2.4* 4.0 -  10.5 K/uL Final  . RBC 01/10/2020 3.11* 4.22 - 5.81 MIL/uL Final  . Hemoglobin 01/10/2020 8.7* 13.0 - 17.0 g/dL Final  . HCT 76/72/0947 26.2* 39.0 - 52.0 % Final  . MCV 01/10/2020 84.2  80.0 - 100.0 fL Final  . MCH 01/10/2020 28.0  26.0 - 34.0 pg Final  . MCHC 01/10/2020 33.2  30.0 - 36.0 g/dL Final  . RDW 09/62/8366 14.9  11.5 - 15.5 % Final  . Platelets 01/10/2020 243  150 - 400 K/uL Final  . nRBC 01/10/2020 0.0  0.0 - 0.2 % Final  . Neutrophils Relative % 01/10/2020 35  % Final  . Neutro Abs 01/10/2020 0.8* 1.7 - 7.7 K/uL Final  . Lymphocytes Relative 01/10/2020 45  % Final  . Lymphs Abs 01/10/2020 1.1  0.7 - 4.0 K/uL Final  . Monocytes Relative 01/10/2020 13  % Final  . Monocytes Absolute 01/10/2020 0.3  0.1 - 1.0 K/uL Final  . Eosinophils Relative 01/10/2020 5  % Final  . Eosinophils Absolute 01/10/2020 0.1  0.0 - 0.5 K/uL Final  . Basophils Relative 01/10/2020 2  % Final  . Basophils Absolute 01/10/2020 0.1  0.0 - 0.1 K/uL Final  . Immature Granulocytes 01/10/2020 0  % Final  . Abs Immature Granulocytes 01/10/2020 0.01  0.00 - 0.07 K/uL Final   Performed at [facility], [address], [place], [place] [address]  . Sed Rate 01/10/2020 30* 0 - 20 mm/hr Final    Performed at [facility], [address] [place], [place] [address]    Assessment:  Wyatt Montgomery is a 65 y.o. 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  He is on Eliquis.  Abdominal MRI on 05/30/2019 revealed a 4.3 x 3.2 x 2.7 cm poorly defined masslike areain the inferior aspect of the pancreatic head. Region was heterogeneous but predominantly T1 isointense and T2 iso to hyperintense. There was severe ductal dilatation of the main pancreatic duct(up to 1.4 cm)in the body of the pancreas.There was possible direct invasion of the medial wall of the descending portion of the duodenum.There were abundant calcifications in this region. The possibility of acute on chronic pancreatitis resulting in this masslike appearance was not excluded. Underlying neoplasm was difficult to exclude.    CA 19-9 has been followed (0-35): 132 on 05/31/2019 and 31 on 08/14/2019.    Work up on 06/02/2019 included a hematocrit 29.5, hemoglobin 9.5, platelets 316,000, WBC 4,800.  Retic was 1.0%.  Ferritin was 42 with and iron saturation of 9% and a TIBC of 363.  Vitamin B12 was 315 with folate 7.0.  Hypercoagulable work-up was negative:  Factor V Leiden, prothrombin gene mutation, lupus anticoagulant panel, beta-2-glycoprotein (IgG <9, IgM <9, IgA <9), anti-cardiolipin antibodies (IgG <9, IgM 18, IgA 13).  Upper endoscopic ultrasound on 08/10/2019 revealed changes consistent with moderate-severe chronic pancreatitis. There was no focal mass seen, but the sensitivity of EUS for pancreas masses was decreased in the setting of calcific pancreatitis. There was a 8.2 x 6.3 cm cystic lesion seen in the peripancreatic region.  Tissue was not obtained.  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 This was new compared to the CT in 05/2019.   Abdomen MRI on 10/10/2019 was  substantially motion degraded scan, limiting assessment. There were stable findings of chronic pancreatitis. There was no discrete pancreatic mass, no peripancreatic fluid collections or cystic  lesions. 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 There was high-grade venous narrowing at the confluence of the main portal, splenic and SMV without discrete residual thrombosis. There was chronic moderate right hydronephrosis with asymmetric right renal atrophy.   He has iron deficiency.  Ferritin was 42 with an iron saturation of 9% on 06/02/2019. Iron saturation was 30% on 08/14/2019.  Ferritin has been followed: 42 on 06/02/2019, 16 on 07/05/2019, 51 on 08/14/2019, 88 on 12/13/2019.    He has B12 deficiency.  B12 was 880 and folate 6.6 on 08/14/2019.  He is on oral B12.   He has leukopenia and anemia.  Labs on 10/16/2019 revealed a reticulocyte count  of 1.1%. LDH was 327 (98-192). Hepatitis B core antibody, hepatitis C antibody, HIV antibody, and ANA were negative.  Flow cytometry was negative.  Peripheral smear revealed no abnormalities. He is not taking oral iron.  He has bedbugs.  The exterminator is coming to his house on 12/25/2019.  He plans to receive the COVID-19 vaccine.   Symptomatically, ***  Plan: 1.   Labs today:  ***   2.   Pancreatic head mass  Endoscopic ultrasound on 07/2202021 felt c/w chronic pancreatitis.   Sensitivity of EUS is decreased in the setting of calcific pancreatitis.   He has a large cystic lesion in the peripancreatic region suggestive of a pancreatic pseudocyst.  CA 19-9 was 31 (normal) on 08/14/2019.  Abdominal MRI on 10/10/2019 was motion degraded.   Findings c/w chronic pancreatitis.  He has an appointment with Dr Servando Snare on 12/27/2019 (missed).  Consider follow-up abdominal MRI around 01/09/2020. 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  Hypercoagulable work-up is negative: factor V Leiden, prothrombin gene mutation, lupus anticoagulant. He has no family history of thrombosis.  Abdominal MRI on 10/10/2019 revealed narrowing at the confluence of the main portal, splenic and SMV without discrete residual thrombosis.   No current evidence of malignancy.  Continue Eliquis. 4.   Normocytic anemia  Hematocrit 32.1.  Hemoglobin 11.2.  MCV 77.7 on 11/28/2019.  Hematocrit 26.8.  Hemoglobin   8.9.  MCV 83.2 on 12/13/2019.   Ferritin 88 with an iron saturation of 12% (low) and a TIBC 248.   Hematocrit 26.4.  Hemoglobin   8.8.  MCV 84.9 on 12/20/2019.  He denies any bleeding.  He has been eating better x 3 days after an acute illness 2 weeks ago.    Restart ferrous sulfate 325 mg po q day with OJ or vitamin C.  Preauth Venofer. 5.   Leukopenia  WBC 2700 with an ANC of 700 (labs back after clinic appt).  Patient has a history of leukopenia.  Contact patient regarding neutropenic precautions.  Patient drinking two 16 oz of beer/day.  Re-evaluation next week.   Check B12, folate, TSH, copper level.   Peripheral smear for review. 6.   Elevated LFTs  AST 236.  ALT 110.  Bilirubin 3.5.  Alkaline phosphatase 341 on 11/28/2019.  AST   51.  ALT   22.  Bilirubin 0.6.  Alkaline phosphatase 144 on 12/13/2019.  Continue to monitor. 7.   Bedbugs  Patient notes exterminator coming 12/25/2019.  Clinic precautions in effect. 8.   Weight loss  He has been eating well x 3 days.  Weight improved 2 pounds since acute illness.  Continue to monitor.  I discussed the assessment and treatment plan with the patient.  The patient was provided an opportunity to ask questions and all were answered.  The patient agreed with the plan and demonstrated an understanding of the instructions.  The patient was advised to call back if the symptoms worsen or if the condition fails to improve as  anticipated.   I provided *** minutes of face-to-face time during this this encounter and > 50% was spent counseling as documented under my assessment and plan.  Rosey Bath, MD, PhD    01/25/2020 , 10:49 AM   I, Pietro Cassis, am acting as Neurosurgeon for [employer]. Merlene Pulling, MD, PhD.  I, Melissa C. Merlene Pulling, MD, have reviewed the above documentation for accuracy and completeness, and I agree with the above.

## 2020-01-29 ENCOUNTER — Inpatient Hospital Stay: Payer: Self-pay | Attending: Hematology and Oncology | Admitting: Hematology and Oncology

## 2020-01-29 ENCOUNTER — Inpatient Hospital Stay: Payer: Self-pay

## 2020-02-03 DIAGNOSIS — B888 Other specified infestations: Secondary | ICD-10-CM | POA: Insufficient documentation

## 2020-02-28 ENCOUNTER — Ambulatory Visit: Payer: Self-pay | Admitting: Gastroenterology

## 2020-02-28 ENCOUNTER — Encounter: Payer: Self-pay | Admitting: *Deleted

## 2020-02-28 NOTE — Progress Notes (Deleted)
Gastroenterology Consultation  Referring Provider:     Rosey Bath, MD Primary Care Physician:  Rosey Bath, MD Primary Gastroenterologist:  Dr. Servando Snare     Reason for Consultation:     Abnormal liver enzymes        HPI:   Wyatt Montgomery is a 65 y.o. y/o male referred for consultation & management of abnormal liver enzymes by Dr. Merlene Pulling, Ferdie Ping, MD.  This patient comes in after being seen by [facility] for a pancreatic head mass that was evaluated with an endoscopic ultrasound.  At that time it was thought to be a pseudocyst.  The patient had an elevated CA 19-9 9 months ago but it came back to normal 6 months ago.  The patient has had consistently elevated liver enzymes with his most recent trends show:  Component     Latest Ref Rng & Units 11/28/2019 11/28/2019 12/13/2019 01/10/2020         9:51 AM  1:24 PM    Albumin     3.5 - 5.0 g/dL 3.0 (L)  3.2 (L) 3.1 (L)  AST     15 - 41 U/L 236 (H)  51 (H) 161 (H)  ALT     0 - 44 U/L 110 (H)  22 50 (H)  Alkaline Phosphatase     38 - 126 U/L 341 (H)  144 (H) 123  Total Bilirubin     0.3 - 1.2 mg/dL 3.5 (H)  0.6 0.5   The patient had a hepatitis B core antibody that was negative in addition to a hepatitis C antibody being negative.  The patient's ANA was negative and his iron studies showed low iron saturation and low total iron.  The patient's HIV was also negative.  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  The patient is following with oncology and was referred to me for abnormal liver enzymes by oncology.  At his oncology appointment the patient had reported 7 drinks per week and also reported that he was drinking between 3 and 9 beers per day but cut it down to 1-2 beers a day  Past Medical History:  Diagnosis Date  . Pancreatic mass   . Thrombosis, portal vein     Past Surgical History:  Procedure Laterality Date  .  ABDOMINAL SURGERY    . EUS N/A 08/10/2019   Procedure: FULL UPPER ENDOSCOPIC ULTRASOUND (EUS) RADIAL;  Surgeon: Rayann Heman, MD;  Location: [facility] ENDOSCOPY;  Service: Endoscopy;  Laterality: N/A;    Prior to Admission medications   Medication Sig Start Date End Date Taking? Authorizing Provider  apixaban (ELIQUIS) 5 MG TABS tablet Take 1 tablet (5 mg total) by mouth 2 (two) times daily. 10/30/19   Rosey Bath, MD  ibuprofen (ADVIL) 200 MG tablet Take 200 mg by mouth every 6 (six) hours as needed.    [provider]  Multiple Vitamin (MULTIVITAMIN WITH MINERALS) TABS tablet Take 1 tablet by mouth daily. 05/31/19   Enedina Finner, MD  vitamin B-12 (CYANOCOBALAMIN) 1000 MCG tablet Take 1,000 mcg by mouth daily.    [provider]    No family history on file.   Social History   Tobacco Use  . Smoking status: Heavy Tobacco Smoker    Packs/day: 0.50    Years: 20.00    Pack years: 10.00    Types: Cigarettes  .  Smokeless tobacco: Never Used  . Tobacco comment: decreased, 1/2 pack per week.  Vaping Use  . Vaping Use: Never used  Substance Use Topics  . Alcohol use: Yes    Alcohol/week: 7.0 standard drinks    Types: 7 Cans of beer per week  . Drug use: Not Currently    Types: Marijuana, Cocaine    Allergies as of 02/28/2020  . (No Known Allergies)    Review of Systems:    All systems reviewed and negative except where noted in HPI.   Physical Exam:  There were no vitals taken for this visit. No LMP for male patient. General:   Alert,  Well-developed, well-nourished, pleasant and cooperative in NAD Head:  Normocephalic and atraumatic. Eyes:  Sclera clear, no icterus.   Conjunctiva pink. Ears:  Normal auditory acuity. Neck:  Supple; no masses or thyromegaly. Lungs:  Respirations even and unlabored.  Clear throughout to auscultation.   No wheezes, crackles, or rhonchi. No acute distress. Heart:  Regular rate and rhythm; no murmurs, clicks, rubs, or  gallops. Abdomen:  Normal bowel sounds.  No bruits.  Soft, non-tender and non-distended without masses, hepatosplenomegaly or hernias noted.  No guarding or rebound tenderness.  Negative Carnett sign.   Rectal:  Deferred.  Pulses:  Normal pulses noted. Extremities:  No clubbing or edema.  No cyanosis. Neurologic:  Alert and oriented x3;  grossly normal neurologically. Skin:  Intact without significant lesions or rashes.  No jaundice. Lymph Nodes:  No significant cervical adenopathy. Psych:  Alert and cooperative. Normal mood and affect.  Imaging Studies: No results found.  Assessment and Plan:   Wyatt Montgomery is a 65 y.o. y/o male ***    Midge Minium, MD. Clementeen Graham    Note: This dictation was prepared with Dragon dictation along with smaller phrase technology. Any transcriptional errors that result from this process are unintentional.

## 2020-03-29 ENCOUNTER — Emergency Department: Payer: Self-pay

## 2020-03-29 ENCOUNTER — Inpatient Hospital Stay
Admission: EM | Admit: 2020-03-29 | Discharge: 2020-04-01 | DRG: 603 | Disposition: A | Discharge status: Home / Self Care | Payer: Self-pay | Attending: Internal Medicine | Admitting: Internal Medicine

## 2020-03-29 ENCOUNTER — Other Ambulatory Visit: Payer: Self-pay

## 2020-03-29 DIAGNOSIS — F17210 Nicotine dependence, cigarettes, uncomplicated: Secondary | ICD-10-CM | POA: Diagnosis present

## 2020-03-29 DIAGNOSIS — B888 Other specified infestations: Secondary | ICD-10-CM | POA: Diagnosis present

## 2020-03-29 DIAGNOSIS — W57XXXA Bitten or stung by nonvenomous insect and other nonvenomous arthropods, initial encounter: Secondary | ICD-10-CM | POA: Diagnosis present

## 2020-03-29 DIAGNOSIS — K860 Alcohol-induced chronic pancreatitis: Secondary | ICD-10-CM | POA: Diagnosis present

## 2020-03-29 DIAGNOSIS — K7460 Unspecified cirrhosis of liver: Secondary | ICD-10-CM | POA: Diagnosis present

## 2020-03-29 DIAGNOSIS — L02416 Cutaneous abscess of left lower limb: Secondary | ICD-10-CM | POA: Diagnosis present

## 2020-03-29 DIAGNOSIS — Z20822 Contact with and (suspected) exposure to covid-19: Secondary | ICD-10-CM | POA: Diagnosis present

## 2020-03-29 DIAGNOSIS — E538 Deficiency of other specified B group vitamins: Secondary | ICD-10-CM | POA: Diagnosis present

## 2020-03-29 DIAGNOSIS — R7989 Other specified abnormal findings of blood chemistry: Secondary | ICD-10-CM | POA: Diagnosis present

## 2020-03-29 DIAGNOSIS — Z79899 Other long term (current) drug therapy: Secondary | ICD-10-CM

## 2020-03-29 DIAGNOSIS — Z7901 Long term (current) use of anticoagulants: Secondary | ICD-10-CM

## 2020-03-29 DIAGNOSIS — L03116 Cellulitis of left lower limb: Principal | ICD-10-CM | POA: Diagnosis present

## 2020-03-29 DIAGNOSIS — M7989 Other specified soft tissue disorders: Secondary | ICD-10-CM

## 2020-03-29 DIAGNOSIS — Z23 Encounter for immunization: Secondary | ICD-10-CM

## 2020-03-29 DIAGNOSIS — T148XXA Other injury of unspecified body region, initial encounter: Secondary | ICD-10-CM | POA: Diagnosis present

## 2020-03-29 DIAGNOSIS — L0390 Cellulitis, unspecified: Secondary | ICD-10-CM | POA: Diagnosis present

## 2020-03-29 LAB — CBC WITH DIFFERENTIAL/PLATELET
Abs Immature Granulocytes: 0.02 K/uL (ref 0.00–0.07)
Basophils Absolute: 0 K/uL (ref 0.0–0.1)
Basophils Relative: 1 %
Eosinophils Absolute: 0.5 K/uL (ref 0.0–0.5)
Eosinophils Relative: 6 %
HCT: 28.7 % — ABNORMAL LOW (ref 39.0–52.0)
Hemoglobin: 9.2 g/dL — ABNORMAL LOW (ref 13.0–17.0)
Immature Granulocytes: 0 %
Lymphocytes Relative: 16 %
Lymphs Abs: 1.2 K/uL (ref 0.7–4.0)
MCH: 26.7 pg (ref 26.0–34.0)
MCHC: 32.1 g/dL (ref 30.0–36.0)
MCV: 83.4 fL (ref 80.0–100.0)
Monocytes Absolute: 0.8 K/uL (ref 0.1–1.0)
Monocytes Relative: 11 %
Neutro Abs: 5.1 K/uL (ref 1.7–7.7)
Neutrophils Relative %: 66 %
Platelets: 333 K/uL (ref 150–400)
RBC: 3.44 MIL/uL — ABNORMAL LOW (ref 4.22–5.81)
RDW: 14.2 % (ref 11.5–15.5)
WBC: 7.7 K/uL (ref 4.0–10.5)
nRBC: 0 % (ref 0.0–0.2)

## 2020-03-29 LAB — COMPREHENSIVE METABOLIC PANEL
ALT: 17 U/L (ref 0–44)
AST: 43 U/L — ABNORMAL HIGH (ref 15–41)
Albumin: 3.3 g/dL — ABNORMAL LOW (ref 3.5–5.0)
Alkaline Phosphatase: 175 U/L — ABNORMAL HIGH (ref 38–126)
Anion gap: 9 (ref 5–15)
BUN: 14 mg/dL (ref 8–23)
CO2: 19 mmol/L — ABNORMAL LOW (ref 22–32)
Calcium: 8.4 mg/dL — ABNORMAL LOW (ref 8.9–10.3)
Chloride: 104 mmol/L (ref 98–111)
Creatinine, Ser: 1.22 mg/dL (ref 0.61–1.24)
GFR, Estimated: >60 mL/min (ref >60)
Glucose, Bld: 130 mg/dL — ABNORMAL HIGH (ref 70–99)
Potassium: 3.6 mmol/L (ref 3.5–5.1)
Sodium: 132 mmol/L — ABNORMAL LOW (ref 135–145)
Total Bilirubin: 0.4 mg/dL (ref 0.3–1.2)
Total Protein: 7 g/dL (ref 6.5–8.1)

## 2020-03-29 LAB — PHOSPHORUS: Phosphorus: 3.7 mg/dL (ref 2.5–4.6)

## 2020-03-29 LAB — LACTIC ACID, PLASMA
Lactic Acid, Venous: 2.1 mmol/L (ref 0.5–1.9)
Lactic Acid, Venous: 1.1 mmol/L (ref 0.5–1.9)

## 2020-03-29 MED ORDER — COVID-19 MRNA VACC (MODERNA) 100 MCG/0.5ML IM SUSP
0.5000 mL | Freq: Once | INTRAMUSCULAR | Status: AC
  Administered 2020-03-31: 14:00:00 0.5 mL via INTRAMUSCULAR
  Filled 2020-03-29: qty 0.5

## 2020-03-29 MED ORDER — ONDANSETRON HCL 4 MG/2ML IJ SOLN: 4.0000 mg | Freq: Four times a day (QID) | INTRAMUSCULAR | Status: DC | PRN

## 2020-03-29 MED ORDER — SODIUM CHLORIDE 0.9 % IV SOLN
1.0000 g | INTRAVENOUS | Status: DC
  Administered 2020-03-30 – 2020-03-31 (×2): 1 g via INTRAVENOUS
  Filled 2020-03-29: qty 10
  Filled 2020-03-29: qty 1
  Filled 2020-03-29: qty 10
  Filled 2020-03-29: qty 1

## 2020-03-29 MED ORDER — PNEUMOCOCCAL VAC POLYVALENT 25 MCG/0.5ML IJ INJ
0.5000 mL | INJECTION | INTRAMUSCULAR | Status: DC
  Filled 2020-03-29: qty 0.5

## 2020-03-29 MED ORDER — LORAZEPAM 1 MG PO TABS
1.0000 mg | ORAL_TABLET | ORAL | Status: DC | PRN
  Administered 2020-03-30: 06:00:00 2 mg via ORAL
  Filled 2020-03-29: qty 2

## 2020-03-29 MED ORDER — ONDANSETRON HCL 4 MG PO TABS: 4.0000 mg | ORAL_TABLET | Freq: Four times a day (QID) | ORAL | Status: DC | PRN

## 2020-03-29 MED ORDER — LORAZEPAM 2 MG/ML IJ SOLN: 1.0000 mg | INTRAMUSCULAR | Status: DC | PRN

## 2020-03-29 MED ORDER — ACETAMINOPHEN 325 MG PO TABS
325.0000 mg | ORAL_TABLET | Freq: Four times a day (QID) | ORAL | Status: DC | PRN
  Administered 2020-03-30: 325 mg via ORAL
  Filled 2020-03-29: qty 1

## 2020-03-29 MED ORDER — INFLUENZA VAC SPLIT QUAD 0.5 ML IM SUSY
0.5000 mL | PREFILLED_SYRINGE | INTRAMUSCULAR | Status: DC
  Filled 2020-03-29: qty 0.5

## 2020-03-29 MED ORDER — SODIUM CHLORIDE 0.9 % IV SOLN
2.0000 g | Freq: Once | INTRAVENOUS | Status: AC
  Administered 2020-03-29: 2 g via INTRAVENOUS
  Filled 2020-03-29: qty 20

## 2020-03-29 MED ORDER — SODIUM CHLORIDE 0.9 % IV BOLUS
1000.0000 mL | Freq: Once | INTRAVENOUS | Status: AC
  Administered 2020-03-29: 1000 mL via INTRAVENOUS

## 2020-03-29 MED ORDER — FOLIC ACID 1 MG PO TABS
1.0000 mg | ORAL_TABLET | Freq: Every day | ORAL | Status: DC
  Administered 2020-03-29 – 2020-04-01 (×4): 1 mg via ORAL
  Filled 2020-03-29 (×4): qty 1

## 2020-03-29 MED ORDER — THIAMINE HCL 100 MG/ML IJ SOLN
100.0000 mg | Freq: Once | INTRAMUSCULAR | Status: DC
  Filled 2020-03-29: qty 2

## 2020-03-29 MED ORDER — ACETAMINOPHEN 650 MG RE SUPP: 325.0000 mg | Freq: Four times a day (QID) | RECTAL | Status: DC | PRN

## 2020-03-29 MED ORDER — SODIUM CHLORIDE 0.9 % IV SOLN: INTRAVENOUS | Status: DC

## 2020-03-29 MED ORDER — THIAMINE HCL 100 MG/ML IJ SOLN
100.0000 mg | Freq: Every day | INTRAMUSCULAR | Status: DC
  Administered 2020-03-29: 22:00:00 100 mg via INTRAVENOUS

## 2020-03-29 MED ORDER — ADULT MULTIVITAMIN W/MINERALS CH
1.0000 | ORAL_TABLET | Freq: Every day | ORAL | Status: DC
  Administered 2020-03-29 – 2020-04-01 (×4): 1 via ORAL
  Filled 2020-03-29 (×4): qty 1

## 2020-03-29 MED ORDER — THIAMINE HCL 100 MG PO TABS
100.0000 mg | ORAL_TABLET | Freq: Every day | ORAL | Status: DC
  Administered 2020-03-30 – 2020-04-01 (×3): 100 mg via ORAL
  Filled 2020-03-29 (×3): qty 1

## 2020-03-29 MED ORDER — ENOXAPARIN SODIUM 40 MG/0.4ML ~~LOC~~ SOLN
40.0000 mg | SUBCUTANEOUS | Status: DC
  Administered 2020-03-29 – 2020-03-31 (×3): 40 mg via SUBCUTANEOUS
  Filled 2020-03-29 (×3): qty 0.4

## 2020-03-29 NOTE — ED Triage Notes (Signed)
Pt to ER with L lower extremity redness and swelling. Area is oozing serous fluid. Area is warm to the touch.  Pt reports no pain. Swelling started on Tuesday with no improvements. No known fevers at home. Pt denies injury. No swelling or edema present to R lower extremity.

## 2020-03-29 NOTE — Plan of Care (Signed)
  Problem: Skin Integrity: Goal: Skin integrity will improve Outcome: Progressing   

## 2020-03-29 NOTE — ED Provider Notes (Signed)
[facility] Emergency Department Provider Note  ____________________________________________   Event Date/Time   First MD Initiated Contact with Patient 03/29/20 1500     (approximate)  I have reviewed the triage vital signs and the nursing notes.   HISTORY  Chief Complaint Cellulitis    HPI Wyatt Montgomery is a 65 y.o. male  With h/o cirrhosis, alcohol dependence, here with left leg swelling, pain. Pt reports that   over the last week, he has had progressively worsening left leg pain and swelling.  The patient states his symptoms began without any kind of significant trauma.  It started as swelling and redness along the distal aspect of his leg and has now spread up to his knee area.  He reports associated intermittent aching and throbbing, though this is not severe.  He has had increasing redness and warmth.  He is also begun draining a small amount of fluid from his leg.  Denies any preceding injuries.  He has a remote history of injury in this leg, but this was greater than 20 years ago.  Denies any distal numbness or weakness.  No history of blood clots.  No recent immobilizations.  No other complaints.       Past Medical History:  Diagnosis Date  . Pancreatic mass   . Thrombosis, portal vein     Patient Active Problem List   Diagnosis Date Noted  . 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 Orthostatic hypotension 12/16/2019  . Acute renal insufficiency 12/16/2019  . Goals of care, counseling/discussion 10/16/2019  . Leukopenia 10/11/2019  . Elevated LFTs 10/11/2019  . Normocytic anemia 08/14/2019  . Iron deficiency anemia 08/14/2019  . B12 deficiency 08/14/2019  . Cyst of pancreas 08/14/2019  . Weight loss 08/14/2019  . Thrombosis of mesenteric vein (HCC)   . Pancreatic mass   . 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 Alcohol-induced chronic pancreatitis (HCC)   . Nausea and vomiting   . Hyponatremia     Past Surgical History:  Procedure  Laterality Date  . ABDOMINAL SURGERY    . EUS N/A 08/10/2019   Procedure: FULL UPPER ENDOSCOPIC ULTRASOUND (EUS) RADIAL;  Surgeon: Rayann Heman, MD;  Location: [facility] ENDOSCOPY;  Service: Endoscopy;  Laterality: N/A;    Prior to Admission medications   Medication Sig Start Date End Date Taking? Authorizing Provider  apixaban (ELIQUIS) 5 MG TABS tablet Take 1 tablet (5 mg total) by mouth 2 (two) times daily. 10/30/19   Rosey Bath, MD  ibuprofen (ADVIL) 200 MG tablet Take 200 mg by mouth every 6 (six) hours as needed.    [provider]  Multiple Vitamin (MULTIVITAMIN WITH MINERALS) TABS tablet Take 1 tablet by mouth daily. 05/31/19   Enedina Finner, MD  vitamin B-12 (CYANOCOBALAMIN) 1000 MCG tablet Take 1,000 mcg by mouth daily.    [provider]    Allergies Patient has no known allergies.  No family history on file.  Social History Social History   Tobacco Use  . Smoking status: Heavy Tobacco Smoker    Packs/day: 0.50    Years: 20.00    Pack years: 10.00    Types: Cigarettes  . Smokeless tobacco: Never Used  . Tobacco comment: decreased, 1/2 pack per week.  Vaping Use  . Vaping Use: Never used  Substance Use Topics  . Alcohol use: Yes    Alcohol/week: 7.0 standard drinks    Types: 7 Cans of beer per week  . Drug use: Not Currently  Types: Marijuana, Cocaine    Review of Systems  Review of Systems  Constitutional: Positive for fatigue. Negative for chills and fever.  HENT: Negative for sore throat.   Respiratory: Negative for shortness of breath.   Cardiovascular: Positive for leg swelling. Negative for chest pain.  Gastrointestinal: Negative for abdominal pain.  Genitourinary: Negative for flank pain.  Musculoskeletal: Negative for neck pain.  Skin: Positive for rash and wound.  Allergic/Immunologic: Negative for immunocompromised state.  Neurological: Negative for numbness.  Hematological: Does not bruise/bleed easily.  All other systems  reviewed and are negative.    ____________________________________________  PHYSICAL EXAM:      VITAL SIGNS: ED Triage Vitals [03/29/20 1414]  Enc Vitals Group     BP 139/75     Pulse Rate 76     Resp 18     Temp 98.3 F (36.8 C)     Temp Source Oral     SpO2 100 %     Weight 152 lb (68.9 kg)     Height 5\' 10"  (1.778 m)     Head Circumference      Peak Flow      Pain Score 0     Pain Loc      Pain Edu?      Excl. in GC?      Physical Exam Vitals and nursing note reviewed.  Constitutional:      General: He is not in acute distress.    Appearance: He is well-developed.  HENT:     Head: Normocephalic and atraumatic.  Eyes:     Conjunctiva/sclera: Conjunctivae normal.  Cardiovascular:     Rate and Rhythm: Normal rate and regular rhythm.     Heart sounds: Normal heart sounds.  Pulmonary:     Effort: Pulmonary effort is normal. No respiratory distress.     Breath sounds: No wheezing.  Abdominal:     General: There is no distension.  Musculoskeletal:     Cervical back: Neck supple.     Left lower leg: Edema present.  Skin:    General: Skin is warm.     Capillary Refill: Capillary refill takes less than 2 seconds.     Findings: No rash.  Neurological:     Mental Status: He is alert and oriented to person, place, and time.     Motor: No abnormal muscle tone.      LOWER EXTREMITY EXAM: Left  INSPECTION & PALPATION: Marked erythema and swelling throughout the left lower extremity from just below the knee to the ankle.  There is secondary weeping of the skin.  Moderate warmth around the anterior portion of the leg.  No overt fluctuance.  SENSORY: sensation is intact to light touch in:  Superficial peroneal nerve distribution (over dorsum of foot) Deep peroneal nerve distribution (over first dorsal web space) Sural nerve distribution (over lateral aspect 5th metatarsal) Saphenous nerve distribution (over medial instep)  MOTOR:  + Motor EHL (great toe  dorsiflexion) + FHL (great toe plantar flexion)  + TA (ankle dorsiflexion)  + GSC (ankle plantar flexion)  VASCULAR: 2+ dorsalis pedis and posterior tibialis pulses Capillary refill < 2 sec, toes warm and well-perfused  COMPARTMENTS: Soft, warm, well-perfused No pain with passive extension No parethesias   ____________________________________________   LABS (all labs ordered are listed, but only abnormal results are displayed)  Labs Reviewed  LACTIC ACID, PLASMA - Abnormal; Notable for the following components:      Result Value   Lactic Acid, Venous  2.1 (*)    All other components within normal limits  COMPREHENSIVE METABOLIC PANEL - Abnormal; Notable for the following components:   Sodium 132 (*)    CO2 19 (*)    Glucose, Bld 130 (*)    Calcium 8.4 (*)    Albumin 3.3 (*)    AST 43 (*)    Alkaline Phosphatase 175 (*)    All other components within normal limits  CBC WITH DIFFERENTIAL/PLATELET - Abnormal; Notable for the following components:   RBC 3.44 (*)    Hemoglobin 9.2 (*)    HCT 28.7 (*)    All other components within normal limits  CULTURE, BLOOD (ROUTINE X 2)  CULTURE, BLOOD (ROUTINE X 2)  SARS CORONAVIRUS 2 (TAT 6-24 HRS)  LACTIC ACID, PLASMA  URINALYSIS, COMPLETE (UACMP) WITH MICROSCOPIC    ____________________________________________  EKG:  ________________________________________  RADIOLOGY All imaging, including plain films, CT scans, and ultrasounds, independently reviewed by me, and interpretations confirmed via formal radiology reads.  ED MD interpretation:   XR Tib'/Fib: Negative  Official radiology report(s): DG Tibia/Fibula Left  Result Date: 03/29/2020 CLINICAL DATA:  Left lower extremity pain, swelling and redness. EXAM: LEFT TIBIA AND FIBULA - 2 VIEW COMPARISON:  None. FINDINGS: The knee and ankle joints are maintained. The tibia and fibula are intact. No bone lesions or destructive bony changes. IMPRESSION: No acute bony findings.  Electronically Signed   By: Rudie MeyerP.  Gallerani M.D.   On: 03/29/2020 15:22    ____________________________________________  PROCEDURES   Procedure(s) performed (including Critical Care):  Procedures  ____________________________________________  INITIAL IMPRESSION / MDM / ASSESSMENT AND PLAN / ED COURSE  As part of my medical decision making, I reviewed the following data within the electronic MEDICAL RECORD NUMBER Nursing notes reviewed and incorporated, Old chart reviewed, Notes from prior ED visits, and [place] Controlled Substance Database       *Wyatt Montgomery was evaluated in Emergency Department on 03/29/2020 for the symptoms described in the history of present illness. He was evaluated in the context of the global COVID-19 pandemic, which necessitated consideration that the patient might be at risk for infection with the SARS-CoV-2 virus that causes COVID-19. Institutional protocols and algorithms that pertain to the evaluation of patients at risk for COVID-19 are in a state of rapid change based on information released by regulatory bodies including the CDC and federal and state organizations. These policies and algorithms were followed during the patient's care in the ED.  Some ED evaluations and interventions may be delayed as a result of limited staffing during the pandemic.*     Medical Decision Making: 65 year old male here with left leg pain, redness, and swelling.  On exam, patient has a significant left lower extremity cellulitis with marked edema.  He has significant warmth and weeping.  Distal neuro vasculature is intact.  Lab work shows mild lactic acidosis.  He is afebrile.  Patient has history of alcoholism and I am concerned about his management of infection at home.  Given the extent of his redness, swelling, and worsening over the last 24 hours with lactic acidosis, will admit for observation.  Antibiotics started here.  No known history of  MRSA.  ____________________________________________  FINAL CLINICAL IMPRESSION(S) / ED DIAGNOSES  Final diagnoses:  Left leg swelling  Left leg cellulitis     MEDICATIONS GIVEN DURING THIS VISIT:  Medications  cefTRIAXone (ROCEPHIN) 2 g in sodium chloride 0.9 % 100 mL IVPB (2 g Intravenous New Bag/Given 03/29/20 1553)  sodium  chloride 0.9 % bolus 1,000 mL (1,000 mLs Intravenous New Bag/Given 03/29/20 1552)     ED Discharge Orders    None       Note:  This document was prepared using Dragon voice recognition software and may include unintentional dictation errors.   Shaune Pollack, MD 03/29/20 [phone]

## 2020-03-29 NOTE — H&P (Addendum)
History and Physical   Wyatt Montgomery EXN:[id number] DOB: 08-Jun-1955 DOA: 03/29/2020  PCP: Rosey Bath, MD  Outpatient Specialists: Dr. Merlene Pulling, medical oncology Patient coming from: home  I have personally briefly reviewed patient's old medical records in [facility] EMR.  Chief Concern: Left lower extremity swelling  HPI: Wyatt Montgomery is a 65 y.o. male with medical history significant for history of alcohol abuse, alcohol induced chronic pancreatitis, cyst of the pancreas, presented to the emergency department for chief concerns of right lower extremity pain redness and swelling for about 2 days.  Patient is unsure of bug bites however on physical exam he had multiple bedbug bites and dry skin.  He denies fever, nausea, vomiting, diarrhea.  Reports he has never felt this way before.  He denies trying anything to make the pain or swelling go away.  He describes the pain as achy.  Denies radiation.  ROS: Constitutional: no weight change, no fever ENT/Mouth: no sore throat, no rhinorrhea Eyes: no eye pain, no vision changes Cardiovascular: no chest pain, no dyspnea,  + edema and pain of the left lower ext, no palpitations Respiratory: no cough, no sputum, no wheezing Gastrointestinal: no nausea, no vomiting, no diarrhea, no constipation Genitourinary: no urinary incontinence, no dysuria, no hematuria Musculoskeletal: no arthralgias, no myalgias Skin: no skin lesions, + pruritus, Neuro: + weakness, no loss of consciousness, no syncope Psych: no anxiety, no depression, + decrease appetite Heme/Lymph: no bruising, no bleeding  ED Course: Discussed with ED provider, patient requiring hospitalization due to cellulitis and elevated lactic acid.  Vitals in the ED was afebrile with temperature of 98, respiration rate of 18, heart rate of 76, blood pressure 139/75, satting at 100% on room air.  Lactic acid was 2.1.  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   DVT prophylaxis: Enoxaparin Code Status: Full code Diet: Heart healthy Family Communication: None Disposition Plan: Pending clinical course Consults called: No Admission status: Observation MedSurg with telemetry, anticipate discharge on 03/30/2020  Past Medical History:  Diagnosis Date  . Pancreatic mass   . Thrombosis, portal vein    Past Surgical History:  Procedure Laterality Date  . ABDOMINAL SURGERY    . EUS N/A 08/10/2019   Procedure: FULL UPPER ENDOSCOPIC ULTRASOUND (EUS) RADIAL;  Surgeon: Rayann Heman, MD;  Location: [facility] ENDOSCOPY;  Service: Endoscopy;  Laterality: N/A;   Social History:  reports that he has been smoking cigarettes. He has a 10.00 pack-year smoking history. He has never used smokeless tobacco. He reports current alcohol use of about 7.0 standard drinks of alcohol per week. He reports previous drug use. Drugs: Marijuana and Cocaine.  No Known Allergies No family history on file. Family history: Family history reviewed and not pertinent  Prior  to Admission medications   Medication Sig Start Date End Date Taking? Authorizing Provider  apixaban (ELIQUIS) 5 MG TABS tablet Take 1 tablet (5 mg total) by mouth 2  (two) times daily. 10/30/19   Rosey Bath, MD  ibuprofen (ADVIL) 200 MG tablet Take 200 mg by mouth every 6 (six) hours as needed.    [provider]  Multiple Vitamin (MULTIVITAMIN WITH MINERALS) TABS tablet Take 1 tablet by mouth daily. 05/31/19   Enedina Finner, MD  vitamin B-12 (CYANOCOBALAMIN) 1000 MCG tablet Take 1,000 mcg by mouth daily.    [provider]   Physical Exam: Vitals:   03/29/20 1414  BP: 139/75  Pulse: 76  Resp: 18  Temp: 98.3 F (36.8 C)  TempSrc: Oral  SpO2: 100%  Weight: 68.9 kg  Height: 5\' 10"  (1.778 m)   Constitutional: appears age-appropriate, frail, chronically ill, malnourished NAD, calm, comfortable Eyes: PERRL, lids and conjunctivae normal ENMT: Mucous membranes are moist. Posterior pharynx clear of any exudate or lesions. Age-appropriate dentition. Hearing appropriate Neck: normal, supple, no masses, no thyromegaly Respiratory: clear to auscultation bilaterally, no wheezing, no crackles. Normal respiratory effort. No accessory muscle use.  Cardiovascular: Regular rate and rhythm, no murmurs / rubs / gallops. No extremity edema. 2+ pedal pulses. No carotid bruits.  Abdomen: no tenderness, no masses palpated, no hepatosplenomegaly. Bowel sounds positive.  Musculoskeletal: no clubbing / cyanosis. No joint deformity upper and lower extremities. Good ROM, no contractures, no atrophy. Normal muscle tone.  Skin: no rashes, lesions, ulcers. No induration Neurologic: Sensation intact. Strength 5/5 in all 4.  Psychiatric: Normal judgment and insight. Alert and oriented x 3. Normal mood.   EKG: Not indicated  x-ray on Admission: I personally reviewed and I agree with radiologist reading as below.  DG Tibia/Fibula Left  Result Date: 03/29/2020 CLINICAL DATA:  Left lower extremity pain, swelling and redness. EXAM: LEFT TIBIA AND FIBULA - 2 VIEW COMPARISON:  None. FINDINGS: The knee and ankle joints are maintained. The tibia and fibula are  intact. No bone lesions or destructive bony changes. IMPRESSION: No acute bony findings. Electronically Signed   By: 05/29/2020 M.D.   On: 03/29/2020 15:22   05/29/2020 Venous Img Lower Unilateral Left  Result Date: 03/29/2020 CLINICAL DATA:  Left leg swelling for 1 week, pain EXAM: LEFT LOWER EXTREMITY VENOUS DOPPLER ULTRASOUND TECHNIQUE: Gray-scale sonography with graded compression, as well as color Doppler and duplex ultrasound were performed to evaluate the lower extremity deep venous systems from the level of the common femoral vein and including the common femoral, femoral, profunda femoral, popliteal and calf veins including the posterior tibial, peroneal and gastrocnemius veins when visible. The superficial great saphenous vein was also interrogated. Spectral Doppler was utilized to evaluate flow at rest and with distal augmentation maneuvers in the common femoral, femoral and popliteal veins. COMPARISON:  None. FINDINGS: Contralateral Common Femoral Vein: Respiratory phasicity is normal and symmetric with the symptomatic side. No evidence of thrombus. Normal compressibility. Common Femoral Vein: No evidence of thrombus. Normal compressibility, respiratory phasicity and response to augmentation. Saphenofemoral Junction: No evidence of thrombus. Normal compressibility and flow on color Doppler imaging. Profunda Femoral Vein: No evidence of thrombus. Normal compressibility and flow on color Doppler imaging. Femoral Vein: No evidence of thrombus. Normal compressibility, respiratory phasicity and response to augmentation. Popliteal Vein: No evidence of thrombus. Normal compressibility, respiratory phasicity and response to augmentation. Calf Veins: No evidence of thrombus. Normal compressibility and flow on color Doppler imaging. Other Findings:  Mildly enlarged and elongated left inguinal lymph nodes measuring up to 3.8 cm in length and 1.1 cm in short axis. Despite these changes lymph nodes have preserved  hypoechoic thin cortex and fatty proliferation of the hila compatible with benign lymph nodes suspect chronic reactive lymph nodes. Peripheral calf edema noted. IMPRESSION: Negative for left lower extremity DVT. Prominent left inguinal lymph nodes as above Peripheral calf edema Electronically Signed   By: Judie Petit.  Shick M.D.   On: 03/29/2020 16:40   Labs on Admission: I have personally reviewed following labs  CBC: Recent Labs  Lab 03/29/20 1418  WBC 7.7  NEUTROABS 5.1  HGB 9.2*  HCT 28.7*  MCV 83.4  PLT 333   Basic Metabolic Panel: Recent Labs  Lab 03/29/20 1418  NA 132*  K 3.6  CL 104  CO2 19*  GLUCOSE 130*  BUN 14  CREATININE 1.22  CALCIUM 8.4*   GFR: Estimated Creatinine Clearance: 61.2 mL/min (by C-G formula based on SCr of 1.22 mg/dL).  Liver Function Tests: Recent Labs  Lab 03/29/20 1418  AST 43*  ALT 17  ALKPHOS 175*  BILITOT 0.4  PROT 7.0  ALBUMIN 3.3*   Urine analysis:    Component Value Date/Time   COLORURINE YELLOW (A) 05/29/2019 1435   APPEARANCEUR CLEAR (A) 05/29/2019 1435   LABSPEC >1.046 (H) 05/29/2019 1435   PHURINE 7.0 05/29/2019 1435   GLUCOSEU NEGATIVE 05/29/2019 1435   HGBUR NEGATIVE 05/29/2019 1435   BILIRUBINUR NEGATIVE 05/29/2019 1435   KETONESUR NEGATIVE 05/29/2019 1435   PROTEINUR NEGATIVE 05/29/2019 1435   NITRITE NEGATIVE 05/29/2019 1435   LEUKOCYTESUR NEGATIVE 05/29/2019 1435   Aryianna Earwood N Graylin Sperling D.O. [facility]  If 7PM-7AM, please contact overnight-coverage provider If 7AM-7PM, please contact day coverage provider [email or web address]  03/29/2020, 4:45 PM

## 2020-03-29 NOTE — ED Notes (Signed)
Patient reports LLE swelling. Weaping skin noted to LLE. Patient reports swelling has been there "for a while," but got worse on Tuesday of this week. Patient denies pain. Patient denies fever or chills at home.

## 2020-03-30 DIAGNOSIS — L02416 Cutaneous abscess of left lower limb: Secondary | ICD-10-CM

## 2020-03-30 DIAGNOSIS — L03116 Cellulitis of left lower limb: Principal | ICD-10-CM

## 2020-03-30 DIAGNOSIS — L0390 Cellulitis, unspecified: Secondary | ICD-10-CM | POA: Diagnosis present

## 2020-03-30 LAB — COMPREHENSIVE METABOLIC PANEL
ALT: 14 U/L (ref 0–44)
AST: 45 U/L — ABNORMAL HIGH (ref 15–41)
Albumin: 2.8 g/dL — ABNORMAL LOW (ref 3.5–5.0)
Alkaline Phosphatase: 144 U/L — ABNORMAL HIGH (ref 38–126)
Anion gap: 7 (ref 5–15)
BUN: 14 mg/dL (ref 8–23)
CO2: 20 mmol/L — ABNORMAL LOW (ref 22–32)
Calcium: 8.7 mg/dL — ABNORMAL LOW (ref 8.9–10.3)
Chloride: 111 mmol/L (ref 98–111)
Creatinine, Ser: 1.35 mg/dL — ABNORMAL HIGH (ref 0.61–1.24)
GFR, Estimated: 59 mL/min — ABNORMAL LOW (ref >60)
Glucose, Bld: 87 mg/dL (ref 70–99)
Potassium: 4.4 mmol/L (ref 3.5–5.1)
Sodium: 138 mmol/L (ref 135–145)
Total Bilirubin: 0.5 mg/dL (ref 0.3–1.2)
Total Protein: 6 g/dL — ABNORMAL LOW (ref 6.5–8.1)

## 2020-03-30 LAB — CBC
HCT: 26 % — ABNORMAL LOW (ref 39.0–52.0)
Hemoglobin: 8.3 g/dL — ABNORMAL LOW (ref 13.0–17.0)
MCH: 27.1 pg (ref 26.0–34.0)
MCHC: 31.9 g/dL (ref 30.0–36.0)
MCV: 85 fL (ref 80.0–100.0)
Platelets: 281 10*3/uL (ref 150–400)
RBC: 3.06 MIL/uL — ABNORMAL LOW (ref 4.22–5.81)
RDW: 14.6 % (ref 11.5–15.5)
WBC: 7 10*3/uL (ref 4.0–10.5)
nRBC: 0 % (ref 0.0–0.2)

## 2020-03-30 LAB — SARS CORONAVIRUS 2 (TAT 6-24 HRS): SARS Coronavirus 2: NEGATIVE

## 2020-03-30 LAB — TSH: TSH: 1.226 u[IU]/mL (ref 0.350–4.500)

## 2020-03-30 LAB — MAGNESIUM: Magnesium: 2 mg/dL (ref 1.7–2.4)

## 2020-03-30 MED ORDER — HYDROCERIN EX CREA
TOPICAL_CREAM | Freq: Two times a day (BID) | CUTANEOUS | Status: DC
  Administered 2020-03-31: 1 via TOPICAL
  Filled 2020-03-30 (×2): qty 113

## 2020-03-30 NOTE — Progress Notes (Signed)
Progress Note    Wyatt Montgomery  WUJ:[id number] DOB: 07-Aug-1955  DOA: 03/29/2020 PCP: Rosey Bath, MD    Brief Narrative:     Medical records reviewed and are as summarized below:  Wyatt Montgomery is an 65 y.o. male with medical history significant for history of alcohol abuse, alcohol induced chronic pancreatitis, cyst of the pancreas, presented to the emergency department for chief concerns of right lower extremity pain redness and swelling for about 2 days. Patient is unsure of bug bites however on physical exam he had multiple bedbug bites and dry skin.    Assessment/Plan:   Principal Problem:   Cellulitis and abscess of left leg Active Problems:   Alcohol-induced chronic pancreatitis (HCC)   B12 deficiency   Elevated LFTs   Infestation by cimex   Bed bug bite   Cellulitis-of the left lower extremity -Status post normal saline 1 L bolus per ED provider and ceftriaxone IV 1 g once -Continue ceftriaxone IV 1 g daily, 4 doses ordered -elevate ext and monitor for improvement -encourage patient to avoid dry skin  Left lower extremity swelling and redness -DVT study negative -suspect due to cellulitis -elevate extremity   Elevated BP -does not appear to be on anything at home -trend and treat as needed  History chronic pancreatitis-cessation of alcohol is recommended Alcohol abuse-counseling given -CIWA protocol, thiamine 100 mg IV once  Multiple bedbug bites and rashes-counseled patient on getting his home treated  H/o iron def anemia -follows with hematology outpatient  Family Communication/Anticipated D/C date and plan/Code Status   DVT prophylaxis: Lovenox ordered. Code Status: Full Code.  Disposition Plan: Status is: Observation  The patient will require care spanning > 2 midnights and should be moved to inpatient because: Inpatient level of care appropriate due to severity of illness  Dispo: The patient is from: Home               Anticipated d/c is to: Home              Patient currently is not medically stable to d/c.     Medical Consultants:    None.     Subjective:   Says his skin has been dry and cracking  Objective:    Vitals:   03/29/20 2159 03/30/20 0108 03/30/20 0444 03/30/20 0742  BP: (!) 163/72 (!) 145/76 (!) 161/97 (!) 143/78  Pulse: 96 98 (!) 103 83  Resp: 18 18 16 15   Temp: 98.4 F (36.9 C) 99.1 F (37.3 C) 99.2 F (37.3 C) 98.6 F (37 C)  TempSrc: Oral Oral Oral Oral  SpO2: 100% 99% 100% 100%  Weight: 62.3 kg     Height: 5\' 10"  (1.778 m)       Intake/Output Summary (Last 24 hours) at 03/30/2020 1028 Last data filed at 03/30/2020 0930 Gross per 24 hour  Intake 240 ml  Output --  Net 240 ml   Filed Weights   03/29/20 1414 03/29/20 2159  Weight: 68.9 kg 62.3 kg    Exam:  General: Appearance:    Thin male in no acute distress    swelling and wounds on left shin/lower leg  Lungs:     respirations unlabored  Heart:    Normal heart rate. Normal rhythm. No murmurs, rubs, or gallops.   MS:   All extremities are intact.   Neurologic:   Awake, alert, pleasant and cooperative    Data Reviewed:   I have personally reviewed following labs and  imaging studies:  Labs: Labs show the following:   Basic Metabolic Panel: Recent Labs  Lab 03/29/20 1418 03/30/20 0459  NA 132* 138  K 3.6 4.4  CL 104 111  CO2 19* 20*  GLUCOSE 130* 87  BUN 14 14  CREATININE 1.22 1.35*  CALCIUM 8.4* 8.7*  MG  --  2.0  PHOS 3.7  --    GFR Estimated Creatinine Clearance: 50 mL/min (A) (by C-G formula based on SCr of 1.35 mg/dL (H)). Liver Function Tests: Recent Labs  Lab 03/29/20 1418 03/30/20 0459  AST 43* 45*  ALT 17 14  ALKPHOS 175* 144*  BILITOT 0.4 0.5  PROT 7.0 6.0*  ALBUMIN 3.3* 2.8*   No results for input(s): LIPASE, AMYLASE in the last 168 hours. No results for input(s): AMMONIA in the last 168 hours. Coagulation profile No results for input(s): INR, PROTIME in the last  168 hours.  CBC: Recent Labs  Lab 03/29/20 1418 03/30/20 0459  WBC 7.7 7.0  NEUTROABS 5.1  --   HGB 9.2* 8.3*  HCT 28.7* 26.0*  MCV 83.4 85.0  PLT 333 281   Cardiac Enzymes: No results for input(s): CKTOTAL, CKMB, CKMBINDEX, TROPONINI in the last 168 hours. BNP (last 3 results) No results for input(s): PROBNP in the last 8760 hours. CBG: No results for input(s): GLUCAP in the last 168 hours. D-Dimer: No results for input(s): DDIMER in the last 72 hours. Hgb A1c: No results for input(s): HGBA1C in the last 72 hours. Lipid Profile: No results for input(s): CHOL, HDL, LDLCALC, TRIG, CHOLHDL, LDLDIRECT in the last 72 hours. Thyroid function studies: Recent Labs    03/30/20 0459  TSH 1.226   Anemia work up: No results for input(s): VITAMINB12, FOLATE, FERRITIN, TIBC, IRON, RETICCTPCT in the last 72 hours. Sepsis Labs: Recent Labs  Lab 03/29/20 1418 03/29/20 1644 03/30/20 0459  WBC 7.7  --  7.0  LATICACIDVEN 2.1* 1.1  --     Microbiology Recent Results (from the past 240 hour(s))  Blood culture (routine x 2)     Status: None (Preliminary result)   Collection Time: 03/29/20  3:43 PM   Specimen: BLOOD  Result Value Ref Range Status   Specimen Description BLOOD BLOOD LEFT HAND  Final   Special Requests   Final    BOTTLES DRAWN AEROBIC AND ANAEROBIC Blood Culture results may not be optimal due to an inadequate volume of blood received in culture bottles   Culture   Final    NO GROWTH < 24 HOURS Performed at [facility], [address], [place], [place] [address]    Report Status PENDING  Incomplete  Blood culture (routine x 2)     Status: None (Preliminary result)   Collection Time: 03/29/20  3:43 PM   Specimen: BLOOD  Result Value Ref Range Status   Specimen Description BLOOD BLOOD RIGHT WRIST  Final   Special Requests   Final    BOTTLES DRAWN AEROBIC AND ANAEROBIC Blood Culture results may not be optimal due to an inadequate volume of blood received  in culture bottles   Culture   Final    NO GROWTH < 24 HOURS Performed at [facility], [address], [place], [place] [address]    Report Status PENDING  Incomplete  SARS CORONAVIRUS 2 (TAT 6-24 HRS) Nasopharyngeal Nasopharyngeal Swab     Status: None   Collection Time: 03/29/20  3:43 PM   Specimen: Nasopharyngeal Swab  Result Value Ref Range Status  SARS Coronavirus 2 NEGATIVE NEGATIVE Final    Comment: (NOTE) SARS-CoV-2 target nucleic acids are NOT DETECTED.  The SARS-CoV-2 RNA is generally detectable in upper and lower respiratory specimens during the acute phase of infection. Negative results do not preclude SARS-CoV-2 infection, do not rule out co-infections with other pathogens, and should not be used as the sole basis for treatment or other patient management decisions. Negative results must be combined with clinical observations, patient history, and epidemiological information. The expected result is Negative.  Fact Sheet for Patients: HairSlick.no  Fact Sheet for Healthcare Providers: quierodirigir.com  This test is not yet approved or cleared by the [place] FDA and  has been authorized for detection and/or diagnosis of SARS-CoV-2 by FDA under an Emergency Use Authorization (EUA). This EUA will remain  in effect (meaning this test can be used) for the duration of the COVID-19 declaration under Se ction 564(b)(1) of the Act, 21 U.S.C. section 360bbb-3(b)(1), unless the authorization is terminated or revoked sooner.  Performed at [facility], [address], [place], [place] [address]     Procedures and diagnostic studies:  DG Tibia/Fibula Left  Result Date: 03/29/2020 CLINICAL DATA:  Left lower extremity pain, swelling and redness. EXAM: LEFT TIBIA AND FIBULA - 2 VIEW COMPARISON:  None. FINDINGS: The knee and ankle joints are maintained. The tibia and fibula are intact. No bone  lesions or destructive bony changes. IMPRESSION: No acute bony findings. Electronically Signed   By: Rudie Meyer M.D.   On: 03/29/2020 15:22   US Venous Img Lower Unilateral Left  Result Date: 03/29/2020 CLINICAL DATA:  Left leg swelling for 1 week, pain EXAM: LEFT LOWER EXTREMITY VENOUS DOPPLER ULTRASOUND TECHNIQUE: Gray-scale sonography with graded compression, as well as color Doppler and duplex ultrasound were performed to evaluate the lower extremity deep venous systems from the level of the common femoral vein and including the common femoral, femoral, profunda femoral, popliteal and calf veins including the posterior tibial, peroneal and gastrocnemius veins when visible. The superficial great saphenous vein was also interrogated. Spectral Doppler was utilized to evaluate flow at rest and with distal augmentation maneuvers in the common femoral, femoral and popliteal veins. COMPARISON:  None. FINDINGS: Contralateral Common Femoral Vein: Respiratory phasicity is normal and symmetric with the symptomatic side. No evidence of thrombus. Normal compressibility. Common Femoral Vein: No evidence of thrombus. Normal compressibility, respiratory phasicity and response to augmentation. Saphenofemoral Junction: No evidence of thrombus. Normal compressibility and flow on color Doppler imaging. Profunda Femoral Vein: No evidence of thrombus. Normal compressibility and flow on color Doppler imaging. Femoral Vein: No evidence of thrombus. Normal compressibility, respiratory phasicity and response to augmentation. Popliteal Vein: No evidence of thrombus. Normal compressibility, respiratory phasicity and response to augmentation. Calf Veins: No evidence of thrombus. Normal compressibility and flow on color Doppler imaging. Other Findings: Mildly enlarged and elongated left inguinal lymph nodes measuring up to 3.8 cm in length and 1.1 cm in short axis. Despite these changes lymph nodes have preserved hypoechoic thin  cortex and fatty proliferation of the hila compatible with benign lymph nodes suspect chronic reactive lymph nodes. Peripheral calf edema noted. IMPRESSION: Negative for left lower extremity DVT. Prominent left inguinal lymph nodes as above Peripheral calf edema Electronically Signed   By: Judie Petit.  Shick M.D.   On: 03/29/2020 16:40    Medications:   . COVID-19 mRNA vaccine (Moderna)  0.5 mL Intramuscular ONCE-1600  . enoxaparin (LOVENOX) injection  40 mg Subcutaneous Q24H  . folic  acid  1 mg Oral Daily  . influenza vac split quadrivalent PF  0.5 mL Intramuscular Tomorrow-1000  . multivitamin with minerals  1 tablet Oral Daily  . pneumococcal 23 valent vaccine  0.5 mL Intramuscular Tomorrow-1000  . thiamine  100 mg Oral Daily   Or  . thiamine  100 mg Intravenous Daily   Continuous Infusions: . cefTRIAXone (ROCEPHIN)  IV 1 g (03/30/20 [id number])     LOS: 0 days   Joseph ArtJessica U Sofia Jaquith  [facility]   How to contact the [facility] Attending or Consulting provider 7A - 7P or covering provider during after hours 7P -7A, for this patient?  1. Check the care team in [facility] and look for a) attending/consulting TRH provider listed and b) the [facility] team listed 2. Log into [email or web address] and use [place]'s universal password to access. If you do not have the password, please contact the hospital operator. 3. Locate the [facility] provider you are looking for under [facility] and page to a number that you can be directly reached. 4. If you still have difficulty reaching the provider, please page the [facility] (Director on Call) for the Hospitalists listed on amion for assistance.  03/30/2020, 10:28 AM

## 2020-03-31 MED ORDER — SODIUM CHLORIDE 0.9 % IV SOLN
INTRAVENOUS | Status: DC | PRN
  Administered 2020-03-31: 11:00:00 250 mL via INTRAVENOUS

## 2020-03-31 MED ORDER — HYDRALAZINE HCL 10 MG PO TABS
10.0000 mg | ORAL_TABLET | Freq: Three times a day (TID) | ORAL | Status: DC
  Administered 2020-03-31 – 2020-04-01 (×4): 10 mg via ORAL
  Filled 2020-03-31 (×6): qty 1

## 2020-03-31 MED ORDER — VANCOMYCIN HCL 1250 MG/250ML IV SOLN
1250.0000 mg | Freq: Once | INTRAVENOUS | Status: AC
  Administered 2020-03-31: 13:00:00 1250 mg via INTRAVENOUS
  Filled 2020-03-31: qty 250

## 2020-03-31 NOTE — Progress Notes (Addendum)
Progress Note    OBRIAN BULSON  CNO:[id number] DOB: January 26, 1955  DOA: 03/29/2020 PCP: Rosey Bath, MD    Brief Narrative:     Medical records reviewed and are as summarized below:  Wyatt Montgomery is an 65 y.o. male with medical history significant for history of alcohol abuse, alcohol induced chronic pancreatitis, cyst of the pancreas, presented to the emergency department for chief concerns of right lower extremity pain redness and swelling for about 2 days. Patient is unsure of bug bites however on physical exam he had multiple bedbug bites and dry skin.    Assessment/Plan:   Principal Problem:   Cellulitis and abscess of left leg Active Problems:   Alcohol-induced chronic pancreatitis (HCC)   B12 deficiency   Elevated LFTs   Infestation by cimex   Bed bug bite   Cellulitis   Cellulitis-of the left lower extremity -Status post normal saline 1 L bolus per ED provider and ceftriaxone IV 1 g once -Continue ceftriaxone IV 1 g daily, 4 doses ordered -not much improvement so will dose vancomycin x 1 -elevate ext and monitor for improvement -encourage patient to avoid dry skin with lotion  Left lower extremity swelling and redness -DVT study negative -suspect due to cellulitis -elevate extremity   Elevated BP -does not appear to be on anything at home -trend and treat as needed  History chronic pancreatitis-cessation of alcohol is recommended Alcohol abuse-counseling given -CIWA protocol, thiamine 100 mg IV once  Multiple bedbug bites and rashes-counseled patient on getting his home treated  H/o iron def anemia -follows with hematology outpatient   Family Communication/Anticipated D/C date and plan/Code Status   DVT prophylaxis: Lovenox ordered. Code Status: Full Code.  Disposition Plan: Status is: inpt  The patient will require care spanning > 2 midnights and should be moved to inpatient because: Inpatient level of care appropriate due to  severity of illness  Dispo: The patient is from: Home              Anticipated d/c is to: Home              Patient currently is not medically stable to d/c.     Medical Consultants:    None.     Subjective:   Asking about going home  Objective:    Vitals:   03/30/20 1551 03/30/20 2015 03/31/20 0643 03/31/20 0842  BP: (!) 160/80 (!) 158/84 (!) 171/98 (!) 154/79  Pulse: 82 68 90 77  Resp: 14 16 16 16   Temp: 99.1 F (37.3 C) 98.7 F (37.1 C) 98.2 F (36.8 C) 98.6 F (37 C)  TempSrc: Oral Oral Oral   SpO2: 100% 100% 100% 100%  Weight:      Height:        Intake/Output Summary (Last 24 hours) at 03/31/2020 1025 Last data filed at 03/30/2020 1700 Gross per 24 hour  Intake 340 ml  Output --  Net 340 ml   Filed Weights   03/29/20 1414 03/29/20 2159  Weight: 68.9 kg 62.3 kg    Exam:  General: Appearance:    Thin male in no acute distress     Lungs:      respirations unlabored  Heart:    Normal heart rate. Normal rhythm. No murmurs, rubs, or gallops.   MS:   All extremities are intact.  Left lower leg larger than right with wounds in different stages of healing, still warm to touch  Neurologic:   Awake, alert,  oriented x 3. No apparent focal neurological           defect.      Data Reviewed:   I have personally reviewed following labs and imaging studies:  Labs: Labs show the following:   Basic Metabolic Panel: Recent Labs  Lab 03/29/20 1418 03/30/20 0459  NA 132* 138  K 3.6 4.4  CL 104 111  CO2 19* 20*  GLUCOSE 130* 87  BUN 14 14  CREATININE 1.22 1.35*  CALCIUM 8.4* 8.7*  MG  --  2.0  PHOS 3.7  --    GFR Estimated Creatinine Clearance: 50 mL/min (A) (by C-G formula based on SCr of 1.35 mg/dL (H)). Liver Function Tests: Recent Labs  Lab 03/29/20 1418 03/30/20 0459  AST 43* 45*  ALT 17 14  ALKPHOS 175* 144*  BILITOT 0.4 0.5  PROT 7.0 6.0*  ALBUMIN 3.3* 2.8*   No results for input(s): LIPASE, AMYLASE in the last 168 hours. No  results for input(s): AMMONIA in the last 168 hours. Coagulation profile No results for input(s): INR, PROTIME in the last 168 hours.  CBC: Recent Labs  Lab 03/29/20 1418 03/30/20 0459  WBC 7.7 7.0  NEUTROABS 5.1  --   HGB 9.2* 8.3*  HCT 28.7* 26.0*  MCV 83.4 85.0  PLT 333 281   Cardiac Enzymes: No results for input(s): CKTOTAL, CKMB, CKMBINDEX, TROPONINI in the last 168 hours. BNP (last 3 results) No results for input(s): PROBNP in the last 8760 hours. CBG: No results for input(s): GLUCAP in the last 168 hours. D-Dimer: No results for input(s): DDIMER in the last 72 hours. Hgb A1c: No results for input(s): HGBA1C in the last 72 hours. Lipid Profile: No results for input(s): CHOL, HDL, LDLCALC, TRIG, CHOLHDL, LDLDIRECT in the last 72 hours. Thyroid function studies: Recent Labs    03/30/20 0459  TSH 1.226   Anemia work up: No results for input(s): VITAMINB12, FOLATE, FERRITIN, TIBC, IRON, RETICCTPCT in the last 72 hours. Sepsis Labs: Recent Labs  Lab 03/29/20 1418 03/29/20 1644 03/30/20 0459  WBC 7.7  --  7.0  LATICACIDVEN 2.1* 1.1  --     Microbiology Recent Results (from the past 240 hour(s))  Blood culture (routine x 2)     Status: None (Preliminary result)   Collection Time: 03/29/20  3:43 PM   Specimen: BLOOD  Result Value Ref Range Status   Specimen Description BLOOD BLOOD LEFT HAND  Final   Special Requests   Final    BOTTLES DRAWN AEROBIC AND ANAEROBIC Blood Culture results may not be optimal due to an inadequate volume of blood received in culture bottles   Culture   Final    NO GROWTH 2 DAYS Performed at [facility], [address], [place], [place] [address]    Report Status PENDING  Incomplete  Blood culture (routine x 2)     Status: None (Preliminary result)   Collection Time: 03/29/20  3:43 PM   Specimen: BLOOD  Result Value Ref Range Status   Specimen Description BLOOD BLOOD RIGHT WRIST  Final   Special Requests   Final     BOTTLES DRAWN AEROBIC AND ANAEROBIC Blood Culture results may not be optimal due to an inadequate volume of blood received in culture bottles   Culture   Final    NO GROWTH 2 DAYS Performed at [facility], [address], [place], [place] [address]    Report Status PENDING  Incomplete  SARS CORONAVIRUS 2 (TAT  6-24 HRS) Nasopharyngeal Nasopharyngeal Swab     Status: None   Collection Time: 03/29/20  3:43 PM   Specimen: Nasopharyngeal Swab  Result Value Ref Range Status   SARS Coronavirus 2 NEGATIVE NEGATIVE Final    Comment: (NOTE) SARS-CoV-2 target nucleic acids are NOT DETECTED.  The SARS-CoV-2 RNA is generally detectable in upper and lower respiratory specimens during the acute phase of infection. Negative results do not preclude SARS-CoV-2 infection, do not rule out co-infections with other pathogens, and should not be used as the sole basis for treatment or other patient management decisions. Negative results must be combined with clinical observations, patient history, and epidemiological information. The expected result is Negative.  Fact Sheet for Patients: HairSlick.nohttps://www.fda.gov/media/138098/download  Fact Sheet for Healthcare Providers: quierodirigir.comhttps://www.fda.gov/media/138095/download  This test is not yet approved or cleared by the [place] FDA and  has been authorized for detection and/or diagnosis of SARS-CoV-2 by FDA under an Emergency Use Authorization (EUA). This EUA will remain  in effect (meaning this test can be used) for the duration of the COVID-19 declaration under Se ction 564(b)(1) of the Act, 21 U.S.C. section 360bbb-3(b)(1), unless the authorization is terminated or revoked sooner.  Performed at [facility], [address] [place], [place] [address]     Procedures and diagnostic studies:  DG Tibia/Fibula Left  Result Date: 03/29/2020 CLINICAL DATA:  Left lower extremity pain, swelling and redness. EXAM: LEFT TIBIA AND FIBULA - 2  VIEW COMPARISON:  None. FINDINGS: The knee and ankle joints are maintained. The tibia and fibula are intact. No bone lesions or destructive bony changes. IMPRESSION: No acute bony findings. Electronically Signed   By: Rudie MeyerP.  Gallerani M.D.   On: 03/29/2020 15:22   US Venous Img Lower Unilateral Left  Result Date: 03/29/2020 CLINICAL DATA:  Left leg swelling for 1 week, pain EXAM: LEFT LOWER EXTREMITY VENOUS DOPPLER ULTRASOUND TECHNIQUE: Gray-scale sonography with graded compression, as well as color Doppler and duplex ultrasound were performed to evaluate the lower extremity deep venous systems from the level of the common femoral vein and including the common femoral, femoral, profunda femoral, popliteal and calf veins including the posterior tibial, peroneal and gastrocnemius veins when visible. The superficial great saphenous vein was also interrogated. Spectral Doppler was utilized to evaluate flow at rest and with distal augmentation maneuvers in the common femoral, femoral and popliteal veins. COMPARISON:  None. FINDINGS: Contralateral Common Femoral Vein: Respiratory phasicity is normal and symmetric with the symptomatic side. No evidence of thrombus. Normal compressibility. Common Femoral Vein: No evidence of thrombus. Normal compressibility, respiratory phasicity and response to augmentation. Saphenofemoral Junction: No evidence of thrombus. Normal compressibility and flow on color Doppler imaging. Profunda Femoral Vein: No evidence of thrombus. Normal compressibility and flow on color Doppler imaging. Femoral Vein: No evidence of thrombus. Normal compressibility, respiratory phasicity and response to augmentation. Popliteal Vein: No evidence of thrombus. Normal compressibility, respiratory phasicity and response to augmentation. Calf Veins: No evidence of thrombus. Normal compressibility and flow on color Doppler imaging. Other Findings: Mildly enlarged and elongated left inguinal lymph nodes measuring up  to 3.8 cm in length and 1.1 cm in short axis. Despite these changes lymph nodes have preserved hypoechoic thin cortex and fatty proliferation of the hila compatible with benign lymph nodes suspect chronic reactive lymph nodes. Peripheral calf edema noted. IMPRESSION: Negative for left lower extremity DVT. Prominent left inguinal lymph nodes as above Peripheral calf edema Electronically Signed   By: Judie PetitM.  Shick M.D.  On: 03/29/2020 16:40    Medications:   . COVID-19 mRNA vaccine (Moderna)  0.5 mL Intramuscular ONCE-1600  . enoxaparin (LOVENOX) injection  40 mg Subcutaneous Q24H  . folic acid  1 mg Oral Daily  . hydrALAZINE  10 mg Oral Q8H  . hydrocerin   Topical BID  . influenza vac split quadrivalent PF  0.5 mL Intramuscular Tomorrow-1000  . multivitamin with minerals  1 tablet Oral Daily  . pneumococcal 23 valent vaccine  0.5 mL Intramuscular Tomorrow-1000  . thiamine  100 mg Oral Daily   Or  . thiamine  100 mg Intravenous Daily   Continuous Infusions: . cefTRIAXone (ROCEPHIN)  IV 1 g (03/30/20 1610)     LOS: 1 day   Joseph Art  [facility]   How to contact the [facility] Attending or Consulting provider 7A - 7P or covering provider during after hours 7P -7A, for this patient?  1. Check the care team in [facility] and look for a) attending/consulting TRH provider listed and b) the [facility] team listed 2. Log into [email or web address] and use [place]'s universal password to access. If you do not have the password, please contact the hospital operator. 3. Locate the [facility] provider you are looking for under [facility] and page to a number that you can be directly reached. 4. If you still have difficulty reaching the provider, please page the [facility] (Director on Call) for the Hospitalists listed on amion for assistance.  03/31/2020, 10:25 AM

## 2020-03-31 NOTE — Plan of Care (Signed)
  Problem: Skin Integrity: Goal: Skin integrity will improve Outcome: Progressing   

## 2020-04-01 LAB — BASIC METABOLIC PANEL
Anion gap: 7 (ref 5–15)
BUN: 11 mg/dL (ref 8–23)
CO2: 23 mmol/L (ref 22–32)
Calcium: 9 mg/dL (ref 8.9–10.3)
Chloride: 107 mmol/L (ref 98–111)
Creatinine, Ser: 1.15 mg/dL (ref 0.61–1.24)
GFR, Estimated: >60 mL/min (ref >60)
Glucose, Bld: 136 mg/dL — ABNORMAL HIGH (ref 70–99)
Potassium: 3.8 mmol/L (ref 3.5–5.1)
Sodium: 137 mmol/L (ref 135–145)

## 2020-04-01 LAB — CBC
HCT: 24.4 % — ABNORMAL LOW (ref 39.0–52.0)
Hemoglobin: 7.6 g/dL — ABNORMAL LOW (ref 13.0–17.0)
MCH: 26.7 pg (ref 26.0–34.0)
MCHC: 31.1 g/dL (ref 30.0–36.0)
MCV: 85.6 fL (ref 80.0–100.0)
Platelets: 280 10*3/uL (ref 150–400)
RBC: 2.85 MIL/uL — ABNORMAL LOW (ref 4.22–5.81)
RDW: 13.9 % (ref 11.5–15.5)
WBC: 6.4 10*3/uL (ref 4.0–10.5)
nRBC: 0 % (ref 0.0–0.2)

## 2020-04-01 LAB — HEMOGLOBIN AND HEMATOCRIT, BLOOD
HCT: 24.5 % — ABNORMAL LOW (ref 39.0–52.0)
Hemoglobin: 8 g/dL — ABNORMAL LOW (ref 13.0–17.0)

## 2020-04-01 MED ORDER — AMOXICILLIN-POT CLAVULANATE 875-125 MG PO TABS
1.0000 | ORAL_TABLET | Freq: Two times a day (BID) | ORAL | Status: DC
  Administered 2020-04-01: 1 via ORAL
  Filled 2020-04-01: qty 1

## 2020-04-01 MED ORDER — FUROSEMIDE 40 MG PO TABS
40.0000 mg | ORAL_TABLET | Freq: Once | ORAL | Status: AC
  Administered 2020-04-01: 09:00:00 40 mg via ORAL
  Filled 2020-04-01: qty 1

## 2020-04-01 MED ORDER — FUROSEMIDE 20 MG PO TABS: 20.0000 mg | ORAL_TABLET | Freq: Every day | ORAL | 0 refills | Status: DC

## 2020-04-01 MED ORDER — FUROSEMIDE 10 MG/ML IJ SOLN: 40.0000 mg | Freq: Once | INTRAMUSCULAR | Status: DC

## 2020-04-01 MED ORDER — AMOXICILLIN-POT CLAVULANATE 875-125 MG PO TABS: 1.0000 | ORAL_TABLET | Freq: Two times a day (BID) | ORAL | 0 refills | Status: DC

## 2020-04-01 NOTE — TOC Initial Note (Signed)
Transition of Care [facility]) - Initial/Assessment Note    Patient Details  Name: Wyatt Montgomery MRN: [id number] Date of Birth: 1955-05-15  Transition of Care [facility]) CM/SW Contact:    Allayne Butcher, RN Phone Number: 04/01/2020, 2:31 PM  Clinical Narrative:                 Patient admitted to the hospital with cellulitis of lower extremity.  RNCM received consult for substance abuse resources.  Patient endorses that he does drink alcohol but he does not feel that he has a problem with it or that he needs any help.  Substance abuse resources list provided to patient for reference if he chooses.  Patient lives with his wife, she is coming to pick him up today.  Patient goes to the [facility] on [address]    Expected Discharge Plan: Home/Self Care Barriers to Discharge: No Barriers Identified   Patient Goals and CMS Choice Patient states their goals for this hospitalization and ongoing recovery are:: glad to be going home      Expected Discharge Plan and Services Expected Discharge Plan: Home/Self Care   Discharge Planning Services: CM Consult   Living arrangements for the past 2 months: Single Family Home Expected Discharge Date: 04/01/20               DME Arranged: N/A DME Agency: NA       HH Arranged: NA          Prior Living Arrangements/Services Living arrangements for the past 2 months: Single Family Home Lives with:: Spouse Patient language and need for interpreter reviewed:: Yes Do you feel safe going back to the place where you live?: Yes      Need for Family Participation in Patient Care: No (Comment) Care giver support system in place?: Yes (comment) (wife)   Criminal Activity/Legal Involvement Pertinent to Current Situation/Hospitalization: No - Comment as needed  Activities of Daily Living Home Assistive Devices/Equipment: None ADL Screening (condition at time of admission) Patient's cognitive ability adequate to safely complete  daily activities?: Yes Is the patient deaf or have difficulty hearing?: No Does the patient have difficulty seeing, even when wearing glasses/contacts?: No Does the patient have difficulty concentrating, remembering, or making decisions?: No Patient able to express need for assistance with ADLs?: Yes Does the patient have difficulty dressing or bathing?: No Independently performs ADLs?: Yes (appropriate for developmental age) Does the patient have difficulty walking or climbing stairs?: No Weakness of Legs: None Weakness of Arms/Hands: None  Permission Sought/Granted   Permission granted to share information with : No              Emotional Assessment Appearance:: Appears stated age Attitude/Demeanor/Rapport: Engaged Affect (typically observed): Accepting Orientation: : Oriented to Self,Oriented to Place,Oriented to  Time,Oriented to Situation Alcohol / Substance Use: Alcohol Use Psych Involvement: No (comment)  Admission diagnosis:  Left leg cellulitis [L03.116] Left leg swelling [M79.89] Cellulitis and abscess of left leg [L03.116, L02.416] Cellulitis [L03.90] Patient Active Problem List   Diagnosis Date Noted  . Cellulitis 03/30/2020  . Cellulitis and abscess of left leg 03/29/2020  . Bed bug bite 03/29/2020  . 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 Orthostatic hypotension 12/16/2019  . Acute renal insufficiency 12/16/2019  . Goals of care, counseling/discussion 10/16/2019  . Leukopenia 10/11/2019  . Elevated LFTs 10/11/2019  . Normocytic anemia 08/14/2019  . Iron deficiency anemia 08/14/2019  . B12 deficiency 08/14/2019  . Cyst of pancreas 08/14/2019  .  Weight loss 08/14/2019  . Thrombosis of mesenteric vein (HCC)   . Pancreatic mass   . 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 Alcohol-induced chronic pancreatitis (HCC)   . Nausea and vomiting   . Hyponatremia    PCP:  Rosey Bath, MD Pharmacy:   [facility] - [place], [place] - [address] [place] [address] Phone: [phone] Fax: [phone]  [facility] - [place], [place] - [address] [place] [address] Phone: [phone] Fax: [phone]     Social Determinants of Health (SDOH) Interventions    Readmission Risk Interventions No flowsheet data found.

## 2020-04-01 NOTE — Discharge Summary (Signed)
Physician Discharge Summary  Wyatt CroakRobert M Montgomery ZHY:[id number] DOB: 11/22/1955 DOA: 03/29/2020  PCP: Rosey Bathorcoran, Melissa C, MD  Admit date: 03/29/2020 Discharge date: 04/01/2020  Admitted From: home Discharge disposition: home   Recommendations for Outpatient Follow-Up:   1. Needs close outpatient follow up with PCP and referral to derm if cellulitis/wounds not resolved 2. Instructed to keep his skin moisturized to avoid open wounds/infection 3. Cbc/bmp 1 week 4. Follow BP-- added lasix daily   Discharge Diagnosis:   Principal Problem:   Cellulitis and abscess of left leg Active Problems:   Alcohol-induced chronic pancreatitis (HCC)   B12 deficiency   Elevated LFTs   Infestation by cimex   Bed bug bite   Cellulitis    Discharge Condition: Improved.  Diet recommendation:  Regular.  Wound care: None.  Code status: Full.   History of Present Illness:   Wyatt Montgomery is a 65 y.o. male with medical history significant for history of alcohol abuse, alcohol induced chronic pancreatitis, cyst of the pancreas, presented to the emergency department for chief concerns of right lower extremity pain redness and swelling for about 2 days.  Patient is unsure of bug bites however on physical exam he had multiple bedbug bites and dry skin.  He denies fever, nausea, vomiting, diarrhea.  Reports he has never felt this way before.  He denies trying anything to make the pain or swelling go away.  He describes the pain as achy.  Denies radiation.   Hospital Course by Problem:   Cellulitis-of the left lower extremity -Status post normal saline 1 L bolus per ED provider and ceftriaxone IV 1 g once -Continue ceftriaxone IV 1 g daily- change to PO abx -not much improvement so will dose vancomycin x 1 -elevate ext and monitor for improvement -encourage patient to avoid dry skin with lotion  Left lower extremity swelling and redness -DVT study negative -suspect due to  cellulitis -elevate extremity   Elevated BP -does not appear to be on anything at home -trend and treat as needed  History chronic pancreatitis-cessation of alcohol is recommended Alcohol abuse-counseling given -CIWA protocol, thiamine 100 mg IV once  Multiple bedbug bites and rashes-counseled patient on getting his home treated  H/o iron def anemia -follows with hematology outpatient     Medical Consultants:      Discharge Exam:   Vitals:   04/01/20 0545 04/01/20 0743  BP: (!) 153/88 122/75  Pulse: 88 84  Resp: 18 16  Temp: 98.5 F (36.9 C) 98.4 F (36.9 C)  SpO2: 100% 100%   Vitals:   03/31/20 2107 04/01/20 0036 04/01/20 0545 04/01/20 0743  BP: (!) 151/95 (!) 151/78 (!) 153/88 122/75  Pulse: 96 86 88 84  Resp: 16 16 18 16   Temp: 98.6 F (37 C) 99.1 F (37.3 C) 98.5 F (36.9 C) 98.4 F (36.9 C)  TempSrc: Oral Oral Oral Oral  SpO2: 100% 100% 100% 100%  Weight:      Height:        General exam: Appears calm and comfortable.    The results of significant diagnostics from this hospitalization (including imaging, microbiology, ancillary and laboratory) are listed below for reference.     Procedures and Diagnostic Studies:   DG Tibia/Fibula Left  Result Date: 03/29/2020 CLINICAL DATA:  Left lower extremity pain, swelling and redness. EXAM: LEFT TIBIA AND FIBULA - 2 VIEW COMPARISON:  None. FINDINGS: The knee and ankle joints are maintained. The tibia and fibula are intact.  No bone lesions or destructive bony changes. IMPRESSION: No acute bony findings. Electronically Signed   By: Rudie Meyer M.D.   On: 03/29/2020 15:22   US Venous Img Lower Unilateral Left  Result Date: 03/29/2020 CLINICAL DATA:  Left leg swelling for 1 week, pain EXAM: LEFT LOWER EXTREMITY VENOUS DOPPLER ULTRASOUND TECHNIQUE: Gray-scale sonography with graded compression, as well as color Doppler and duplex ultrasound were performed to evaluate the lower extremity deep venous  systems from the level of the common femoral vein and including the common femoral, femoral, profunda femoral, popliteal and calf veins including the posterior tibial, peroneal and gastrocnemius veins when visible. The superficial great saphenous vein was also interrogated. Spectral Doppler was utilized to evaluate flow at rest and with distal augmentation maneuvers in the common femoral, femoral and popliteal veins. COMPARISON:  None. FINDINGS: Contralateral Common Femoral Vein: Respiratory phasicity is normal and symmetric with the symptomatic side. No evidence of thrombus. Normal compressibility. Common Femoral Vein: No evidence of thrombus. Normal compressibility, respiratory phasicity and response to augmentation. Saphenofemoral Junction: No evidence of thrombus. Normal compressibility and flow on color Doppler imaging. Profunda Femoral Vein: No evidence of thrombus. Normal compressibility and flow on color Doppler imaging. Femoral Vein: No evidence of thrombus. Normal compressibility, respiratory phasicity and response to augmentation. Popliteal Vein: No evidence of thrombus. Normal compressibility, respiratory phasicity and response to augmentation. Calf Veins: No evidence of thrombus. Normal compressibility and flow on color Doppler imaging. Other Findings: Mildly enlarged and elongated left inguinal lymph nodes measuring up to 3.8 cm in length and 1.1 cm in short axis. Despite these changes lymph nodes have preserved hypoechoic thin cortex and fatty proliferation of the hila compatible with benign lymph nodes suspect chronic reactive lymph nodes. Peripheral calf edema noted. IMPRESSION: Negative for left lower extremity DVT. Prominent left inguinal lymph nodes as above Peripheral calf edema Electronically Signed   By: Judie Petit.  Shick M.D.   On: 03/29/2020 16:40     Labs:   Basic Metabolic Panel: Recent Labs  Lab 03/29/20 1418 03/30/20 0459 04/01/20 0518  NA 132* 138 137  K 3.6 4.4 3.8  CL 104 111  107  CO2 19* 20* 23  GLUCOSE 130* 87 136*  BUN 14 14 11   CREATININE 1.22 1.35* 1.15  CALCIUM 8.4* 8.7* 9.0  MG  --  2.0  --   PHOS 3.7  --   --    GFR Estimated Creatinine Clearance: 58.7 mL/min (by C-G formula based on SCr of 1.15 mg/dL). Liver Function Tests: Recent Labs  Lab 03/29/20 1418 03/30/20 0459  AST 43* 45*  ALT 17 14  ALKPHOS 175* 144*  BILITOT 0.4 0.5  PROT 7.0 6.0*  ALBUMIN 3.3* 2.8*   No results for input(s): LIPASE, AMYLASE in the last 168 hours. No results for input(s): AMMONIA in the last 168 hours. Coagulation profile No results for input(s): INR, PROTIME in the last 168 hours.  CBC: Recent Labs  Lab 03/29/20 1418 03/30/20 0459 04/01/20 0518 04/01/20 1040  WBC 7.7 7.0 6.4  --   NEUTROABS 5.1  --   --   --   HGB 9.2* 8.3* 7.6* 8.0*  HCT 28.7* 26.0* 24.4* 24.5*  MCV 83.4 85.0 85.6  --   PLT 333 281 280  --    Cardiac Enzymes: No results for input(s): CKTOTAL, CKMB, CKMBINDEX, TROPONINI in the last 168 hours. BNP: Invalid input(s): POCBNP CBG: No results for input(s): GLUCAP in the last 168 hours. D-Dimer No results for input(s):  DDIMER in the last 72 hours. Hgb A1c No results for input(s): HGBA1C in the last 72 hours. Lipid Profile No results for input(s): CHOL, HDL, LDLCALC, TRIG, CHOLHDL, LDLDIRECT in the last 72 hours. Thyroid function studies Recent Labs    03/30/20 0459  TSH 1.226   Anemia work up No results for input(s): VITAMINB12, FOLATE, FERRITIN, TIBC, IRON, RETICCTPCT in the last 72 hours. Microbiology Recent Results (from the past 240 hour(s))  Blood culture (routine x 2)     Status: None (Preliminary result)   Collection Time: 03/29/20  3:43 PM   Specimen: BLOOD  Result Value Ref Range Status   Specimen Description BLOOD BLOOD LEFT HAND  Final   Special Requests   Final    BOTTLES DRAWN AEROBIC AND ANAEROBIC Blood Culture results may not be optimal due to an inadequate volume of blood received in culture bottles    Culture   Final    NO GROWTH 3 DAYS Performed at [facility], [address] [place], [place] [address]    Report Status PENDING  Incomplete  Blood culture (routine x 2)     Status: None (Preliminary result)   Collection Time: 03/29/20  3:43 PM   Specimen: BLOOD  Result Value Ref Range Status   Specimen Description BLOOD BLOOD RIGHT WRIST  Final   Special Requests   Final    BOTTLES DRAWN AEROBIC AND ANAEROBIC Blood Culture results may not be optimal due to an inadequate volume of blood received in culture bottles   Culture   Final    NO GROWTH 3 DAYS Performed at [facility], [address], [place], [place] [address]    Report Status PENDING  Incomplete  SARS CORONAVIRUS 2 (TAT 6-24 HRS) Nasopharyngeal Nasopharyngeal Swab     Status: None   Collection Time: 03/29/20  3:43 PM   Specimen: Nasopharyngeal Swab  Result Value Ref Range Status   SARS Coronavirus 2 NEGATIVE NEGATIVE Final    Comment: (NOTE) SARS-CoV-2 target nucleic acids are NOT DETECTED.  The SARS-CoV-2 RNA is generally detectable in upper and lower respiratory specimens during the acute phase of infection. Negative results do not preclude SARS-CoV-2 infection, do not rule out co-infections with other pathogens, and should not be used as the sole basis for treatment or other patient management decisions. Negative results must be combined with clinical observations, patient history, and epidemiological information. The expected result is Negative.  Fact Sheet for Patients: HairSlick.no  Fact Sheet for Healthcare Providers: quierodirigir.com  This test is not yet approved or cleared by the [place] FDA and  has been authorized for detection and/or diagnosis of SARS-CoV-2 by FDA under an Emergency Use Authorization (EUA). This EUA will remain  in effect (meaning this test can be used) for the duration of the COVID-19 declaration  under Se ction 564(b)(1) of the Act, 21 U.S.C. section 360bbb-3(b)(1), unless the authorization is terminated or revoked sooner.  Performed at [facility], [address] [place], [place] [address]      Discharge Instructions:   Discharge Instructions    Diet - low sodium heart healthy   Complete by: As directed    Discharge instructions   Complete by: As directed    BMP 1 week Follow a low salt diet for your elevated BP Keep skin moisturized with lotion Keep left leg elevated Close follow up with PCP to ensure resolution of cellulitis   Increase activity slowly   Complete by: As directed  Allergies as of 04/01/2020   No Known Allergies     Medication List    TAKE these medications   acetaminophen 325 MG tablet Commonly known as: TYLENOL Take 650 mg by mouth every 6 (six) hours as needed.   amoxicillin-clavulanate 875-125 MG tablet Commonly known as: AUGMENTIN Take 1 tablet by mouth every 12 (twelve) hours.   furosemide 20 MG tablet Commonly known as: Lasix Take 1 tablet (20 mg total) by mouth daily.       Follow-up Information    Rosey Bath, MD Follow up in 1 week(s).   Specialty: Hematology and Oncology Why: BP check and ensure resolution of cellulitis cbc/bmp Contact information: [address] [place] [address] [phone]                Time coordinating discharge: 35 min  Signed:  Joseph Art DO  [facility] 04/01/2020, 1:48 PM

## 2020-04-03 LAB — CULTURE, BLOOD (ROUTINE X 2)
Culture: NO GROWTH
Culture: NO GROWTH

## 2020-05-29 NOTE — Progress Notes (Signed)
The following Assist/Replace Program for Venofer from [employer] has been terminated due to MAP has expired.  Last DOS: None . Please reach out if patient needs assistance again  Virgina Jock, CPhT IV Drug Replacement Specialist  [facility] Phone: [phone]

## 2020-10-30 IMAGING — MR MR ABDOMEN WO/W CM
22 of 26 series · 42 of 48 positions shown · IV contrast (gadavist)
Comparison: No prior abdominal MRI. CT the abdomen and pelvis
05/29/2019.

CLINICAL DATA: 63-year-old male with history of acute thrombosis of
the portal and superior mesenteric veins. History of chronic
pancreatitis. Evaluate for intra-abdominal abscess or infection.

EXAM:
MRI ABDOMEN WITHOUT AND WITH CONTRAST
TECHNIQUE: Multiplanar multisequence MR imaging of the abdomen was performed
both before and after the administration of intravenous contrast.
CONTRAST:  7mL GADAVIST GADOBUTROL 1 MMOL/ML IV SOLN

[Series 9: cor haste · coronal · 6.0mm · 1.19mm/px · 2 of 32 slices shown (1 of 2)]
[im 1/32]
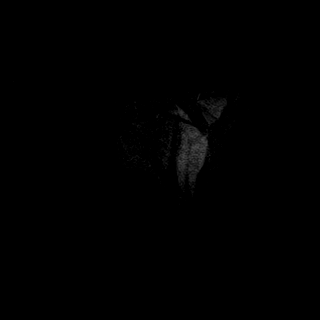
[im 32/32]
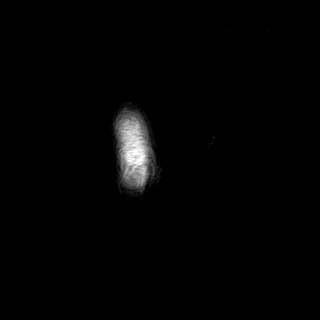

[Series 10: ax haste · axial · 6.0mm · 1.19mm/px · z∈[-153,+69]mm · 2 of 32 slices shown (1 of 2)]
[im 1/32]
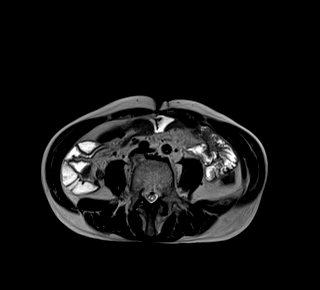
[im 32/32]
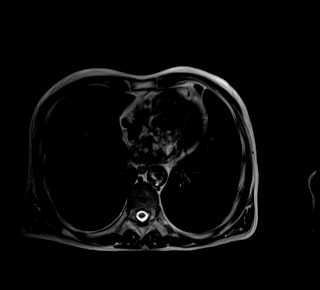

[Series 13: T2 fat-sat · axial · 6.0mm · 1.19mm/px · z∈[-146,+62]mm · 2 of 30 slices shown (1 of 2)]
[im 1/30]
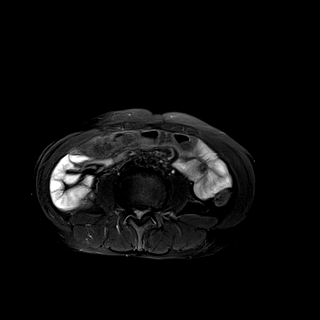
[im 30/30]
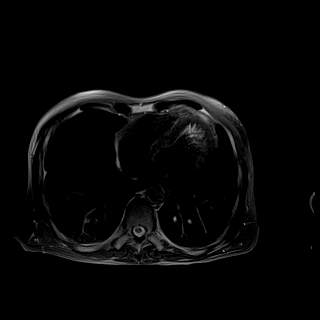

[Series 16: cor haste · coronal · 6.0mm · 1.19mm/px · 1 of 32 slices shown (2 of 2)]
[im 1/32]
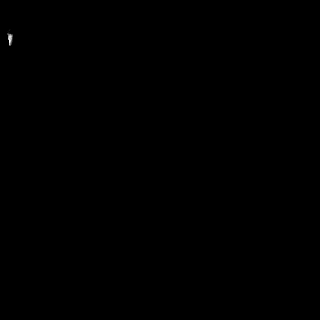

[Series 17: ax haste · axial · 6.0mm · 1.19mm/px · 1 of 32 slices shown (2 of 2)]
[im 1/32]
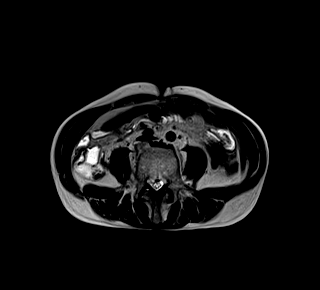

[Series 20: T2 fat-sat · axial · 6.0mm · 1.19mm/px · 1 of 38 slices shown (2 of 2)]
[im 1/38]
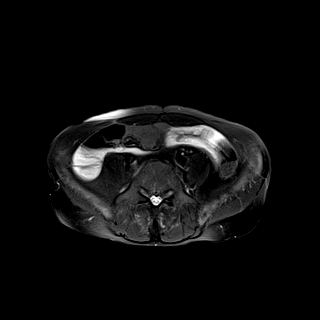

[Series 21: ax dwi_tracew · axial · 6.0mm · 1.42mm/px · z∈[-130,+137]mm · 4 of 114 slices shown]
[im 1/114]
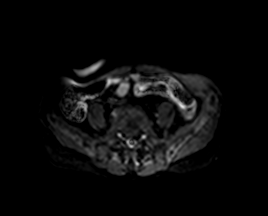
[im 38/114]
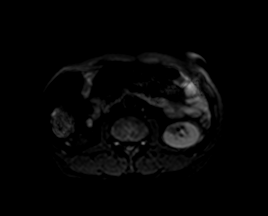
[im 76/114]
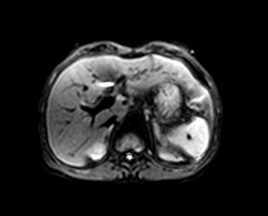
[im 114/114]
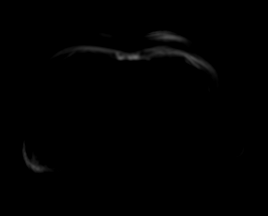

[Series 22: ax dwi_adc · axial · 6.0mm · 1.42mm/px · 1 of 38 slices shown]
[im 1/38]
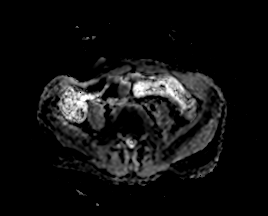

[Series 23: T1 · axial · 6.0mm · 0.74mm/px · z∈[-114,+152]mm · 2 of 76 slices shown (1 of 2)]
[im 1/76]
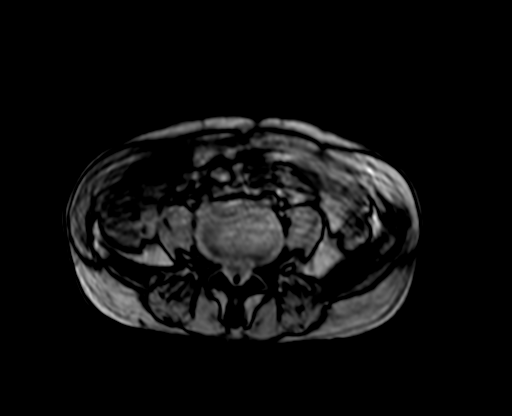
[im 76/76]
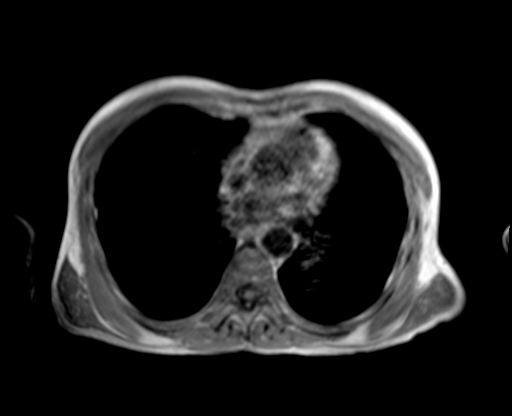

[Series 24: T1 · axial · 6.0mm · 0.74mm/px · z∈[-114,+152]mm · 2 of 76 slices shown (2 of 2)]
[im 1/76]
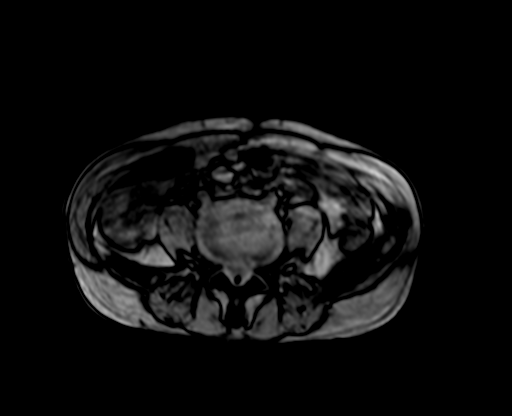
[im 76/76]
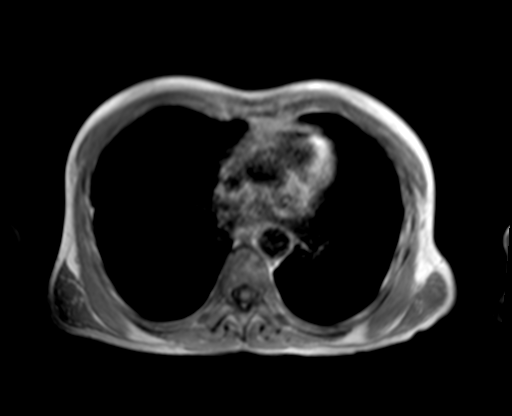

[Series 28: bSSFP · axial · 6.0mm · 0.74mm/px · 1 of 32 slices shown]
[im 1/32]
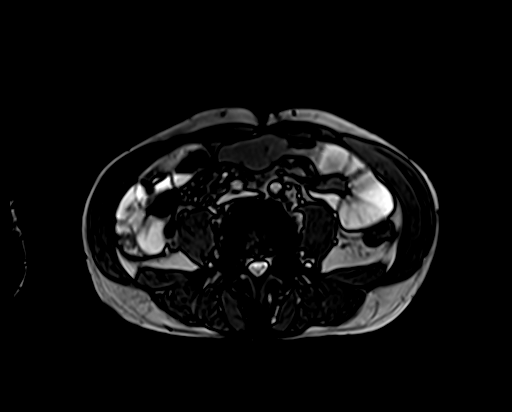

[Series 29: T1 dynamic fat-sat · axial · non-contrast · 3.8mm · 1.19mm/px · z∈[-131,+138]mm · 2 of 72 slices shown (1 of 6)]
[im 1/72]
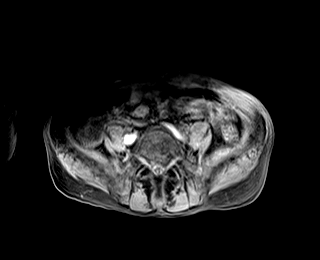
[im 72/72]
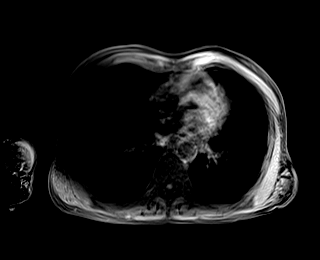

[Series 30: T1 dynamic fat-sat · axial · non-contrast · 3.8mm · 1.19mm/px · z∈[-131,+138]mm · 2 of 72 slices shown (2 of 6)]
[im 1/72]
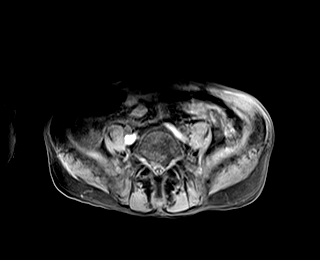
[im 72/72]
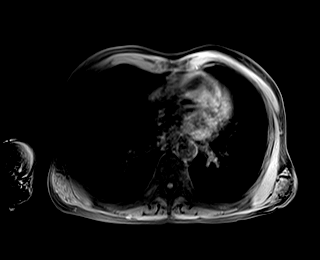

[Series 31: T1 dynamic fat-sat · axial · 3.8mm · 1.19mm/px · z∈[-131,+138]mm · 2 of 72 slices shown (3 of 6)]
[im 1/72]
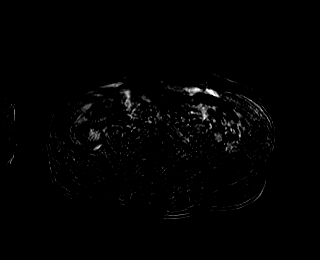
[im 72/72]
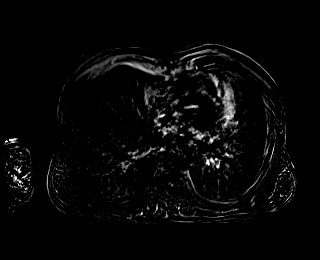

[Series 32: T1 dynamic fat-sat post-contrast · axial · 3.8mm · 1.19mm/px · z∈[-131,+138]mm · 2 of 72 slices shown (1 of 4)]
[im 1/72]
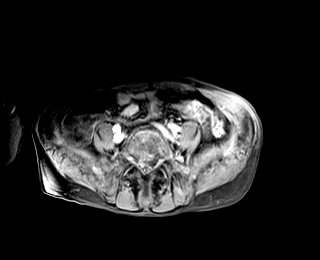
[im 72/72]
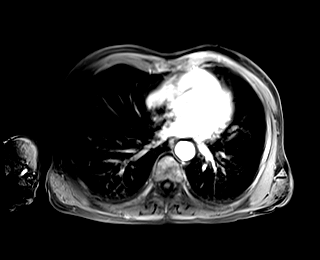

[Series 33: T1 dynamic fat-sat · axial · 3.8mm · 1.19mm/px · z∈[-131,+138]mm · 2 of 72 slices shown (4 of 6)]
[im 1/72]
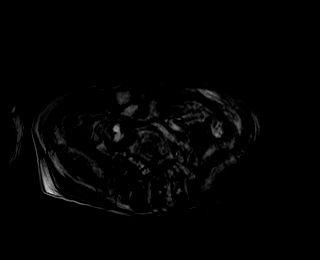
[im 72/72]
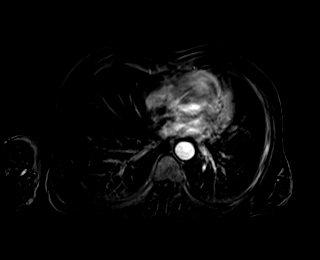

[Series 34: T1 dynamic fat-sat post-contrast · axial · 3.8mm · 1.19mm/px · z∈[-131,+138]mm · 2 of 72 slices shown (2 of 4)]
[im 1/72]
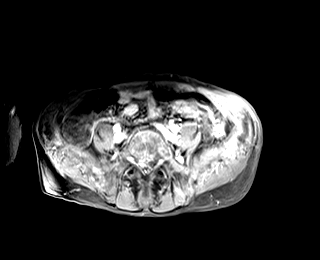
[im 72/72]
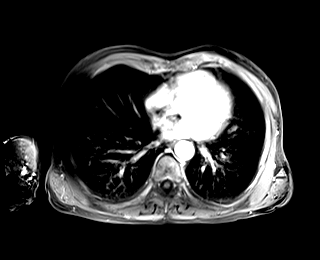

[Series 35: T1 dynamic fat-sat · axial · 3.8mm · 1.19mm/px · z∈[-131,+138]mm · 2 of 72 slices shown (5 of 6)]
[im 1/72]
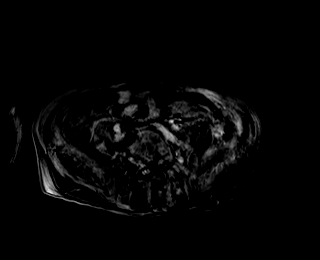
[im 72/72]
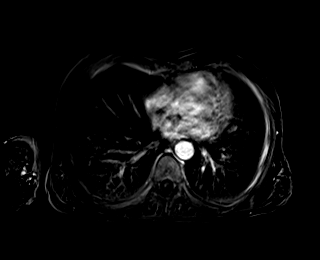

[Series 36: T1 dynamic fat-sat post-contrast · axial · 3.8mm · 1.19mm/px · z∈[-131,+138]mm · 2 of 72 slices shown (3 of 4)]
[im 1/72]
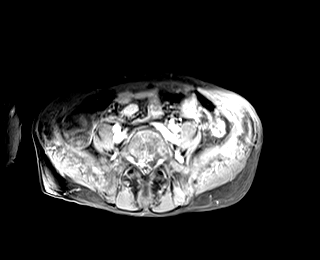
[im 72/72]
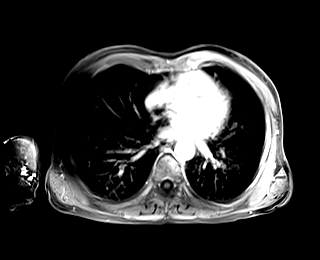

[Series 37: T1 dynamic fat-sat · axial · 3.8mm · 1.19mm/px · z∈[-131,+138]mm · 2 of 72 slices shown (6 of 6)]
[im 1/72]
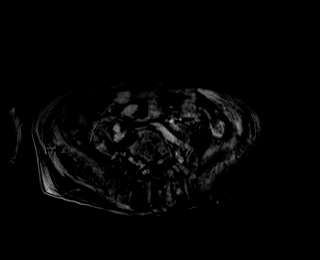
[im 72/72]
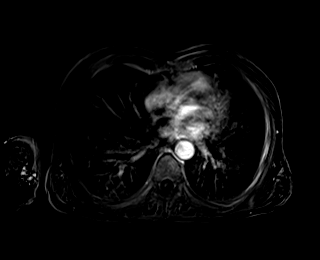

[Series 38: T1 dynamic post-contrast · coronal · 3.0mm · 1.31mm/px · 3 of 80 slices shown]
[im 1/80]
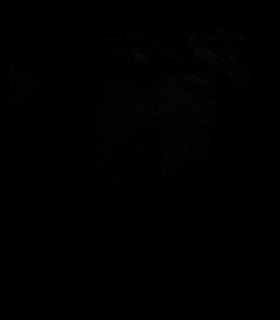
[im 40/80]
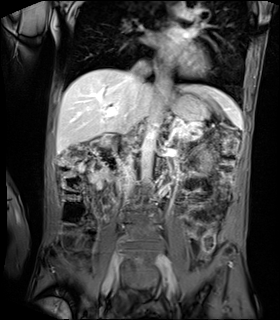
[im 80/80]
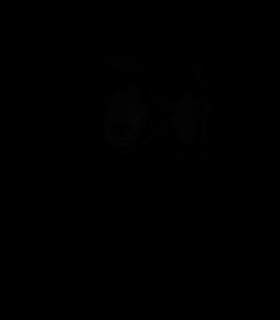

[Series 39: T1 dynamic fat-sat post-contrast · axial · 3.8mm · 1.19mm/px · z∈[-131,+138]mm · 2 of 72 slices shown (4 of 4)]
[im 1/72]
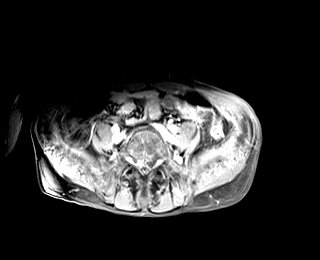
[im 72/72]
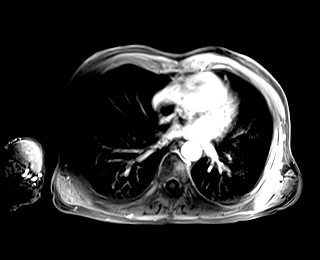

[42 of 48 positions shown; findings below may reference images not displayed]

FINDINGS: Comment: Portions of today's examination are limited by considerable
patient respiratory motion.

Lower chest: Unremarkable.

Hepatobiliary: No suspicious cystic or solid hepatic lesions. No
intra or extrahepatic biliary ductal dilatation. Status post
cholecystectomy. Common bile duct measures 3 mm in the porta
hepatis.

Pancreas: Poorly defined mass-like area in the inferior aspect of
the pancreatic head best appreciated on axial image 37 of series 32
and coronal image 48 of series 38) where this region is estimated to
measure approximately 4.3 x 3.2 x 2.7 cm. This region is
heterogeneous in signal intensity but predominantly T1 isointense
and T2 iso to hyperintense. On post gadolinium imaging this region
demonstrates heterogeneous internal enhancement with several small
cystic appearing regions more medially and a solid appearing region
more laterally which appears to invade the medial wall of the second
portion of the duodenum (well appreciated on axial image 37 of
series 39). Severe dilatation of the main pancreatic duct which
measures up to 1.4 cm in the body of the pancreas. Atrophy
throughout the body and tail of the pancreas. No peripancreatic
fluid collections or inflammatory changes.

Spleen:  Unremarkable.

Adrenals/Urinary Tract: Mild atrophy of the right kidney.
Right-sided extrarenal pelvis (normal anatomical variant). Left
kidney and bilateral adrenal glands are normal in appearance.

Stomach/Bowel: Profound mural thickening along the medial wall of
the second portion of the duodenum concerning for direct invasion
from suspected pancreatic head mass.

Vascular/Lymphatic: Aortic atherosclerosis, without evidence of
aneurysm in the abdominal vasculature. Nonocclusive thrombus in the
distal superior mesenteric vein, at the splenoportal confluence, and
in the proximal portal vein. The suspected mass in the head of the
pancreas is well separated from the superior mesenteric artery and
the celiac axis. The mass makes contact with the right lateral
surface of the distal superior mesenteric vein as well as the
splenoportal confluence.

Other: No significant volume of ascites noted in the visualized
portions of the peritoneal cavity.

Musculoskeletal: No aggressive appearing osseous lesions are noted
in the visualized portions of the skeleton.
IMPRESSION: 1. Mass-like appearance of the inferior aspect of the pancreatic
head with possible direct invasion of the medial wall of the
descending portion of the duodenum. This is associated with diffuse
pancreatic ductal dilatation. Given the appearance on prior CT the
abdomen and pelvis which clearly demonstrated abundant
calcifications in this region, the possibility of acute on chronic
pancreatitis resulting in this mass-like appearance is not excluded,
however, underlying neoplasm is difficult to exclude on the basis of
today's MR examination, and further evaluation with endoscopic
ultrasound is strongly recommended. This is associated with
nonobstructive thrombus in the distal superior mesenteric vein,
splenoportal confluence and proximal portal vein.
2. No definitive signs of metastatic disease in the abdomen at this
time.
3. Additional incidental findings, as above.

## 2020-10-30 IMAGING — CR DG CHEST 2V
2 series · 2 of 2 positions shown · non-contrast
Comparison: 05/09/2008

CLINICAL DATA: Fever

EXAM:
CHEST - 2 VIEW

[chest pa]
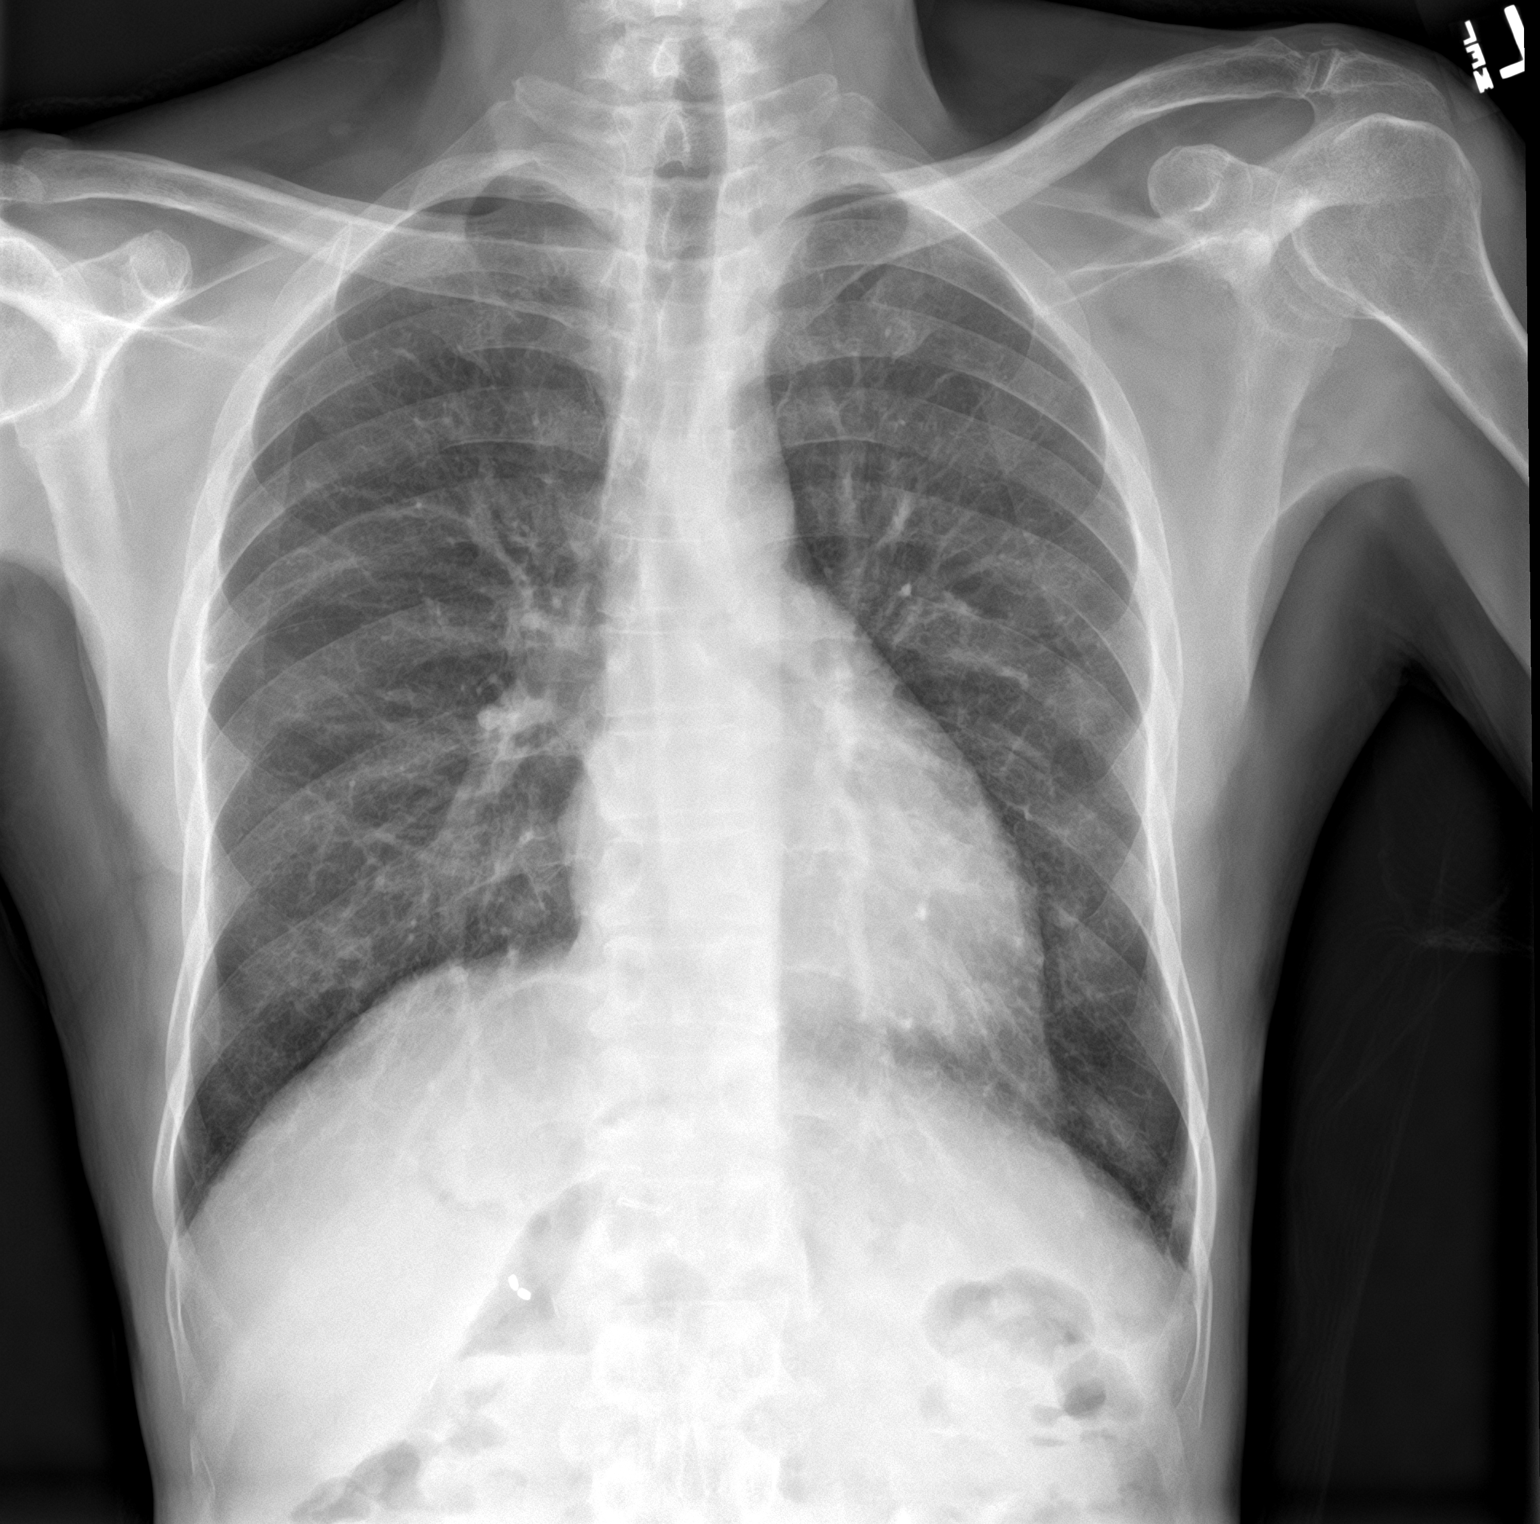

[chest lat]
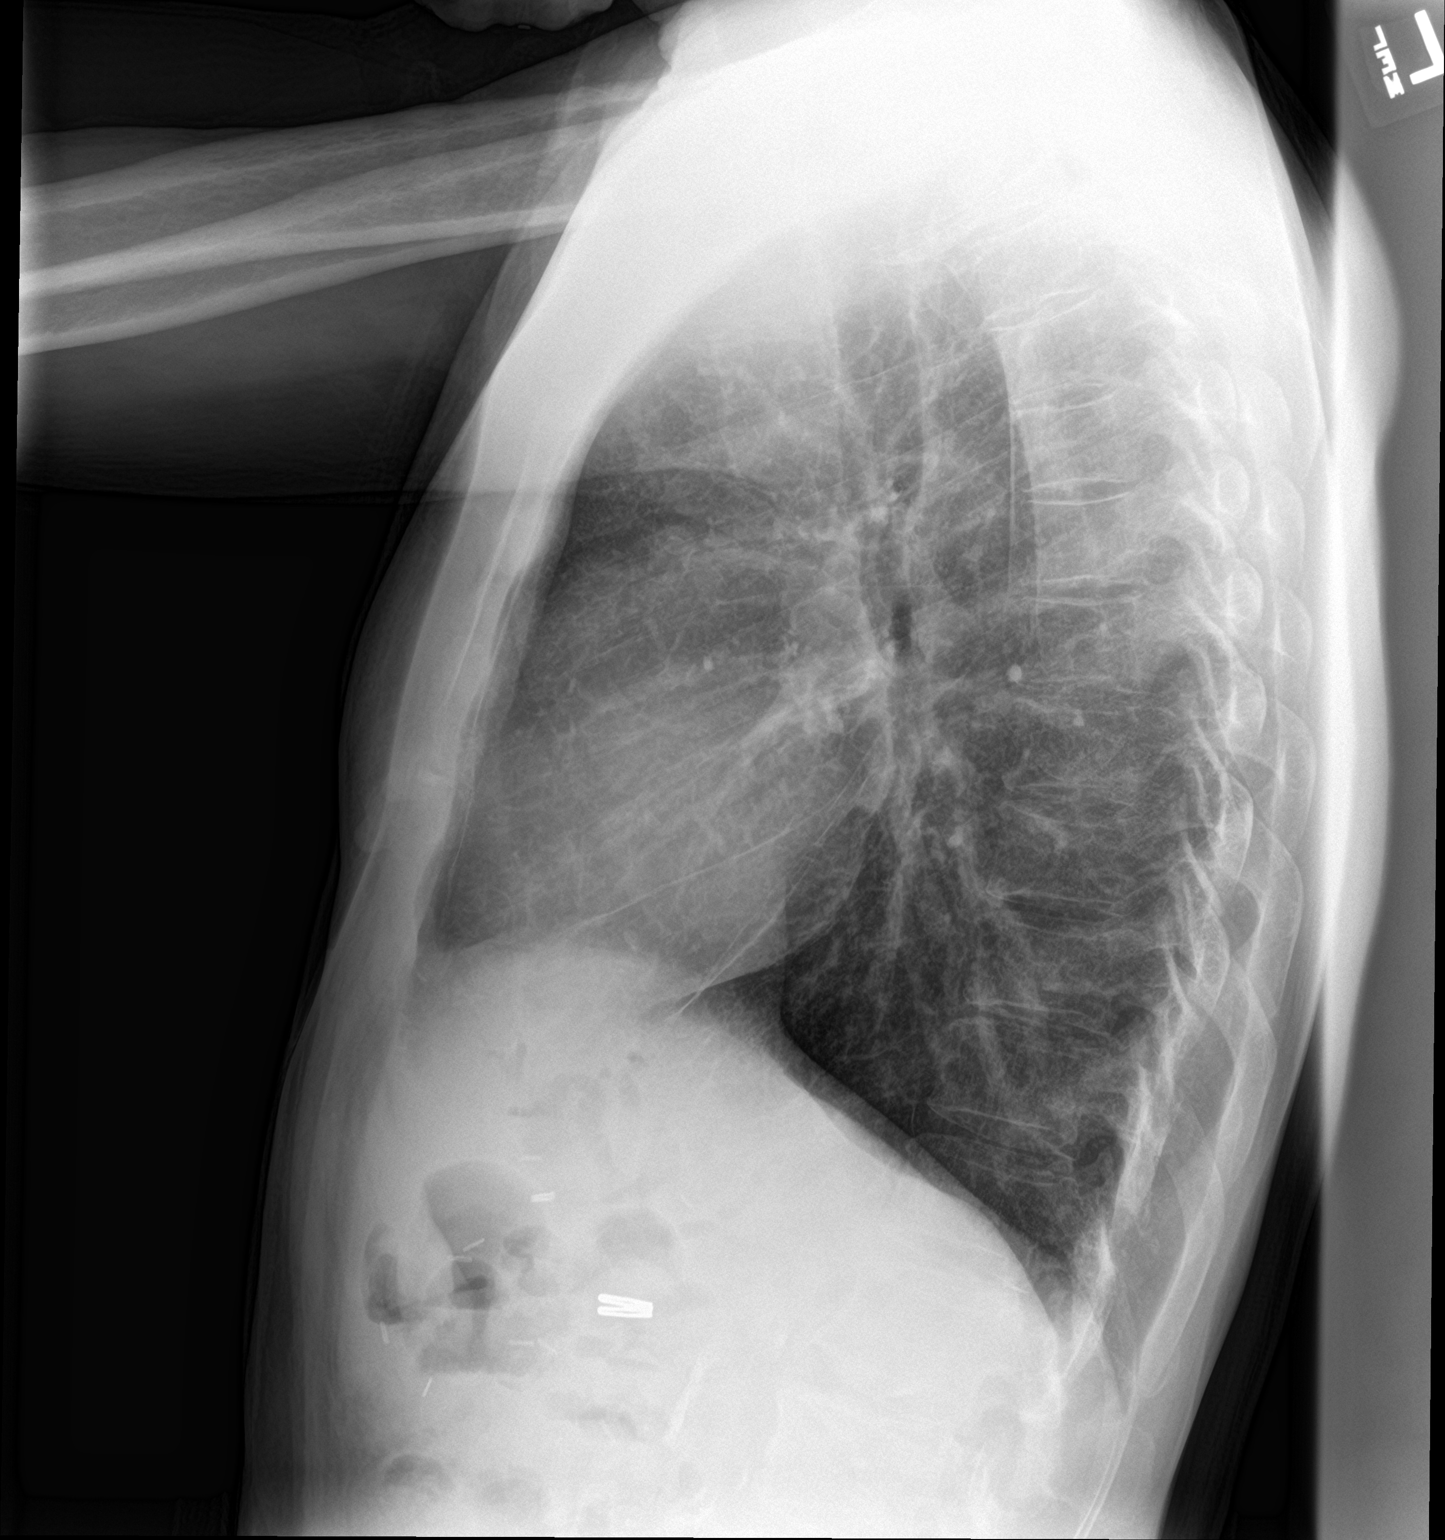

[2 of 2 positions shown; findings below may reference images not displayed]

FINDINGS: The lungs are hyperinflated likely secondary to COPD. There is no
focal consolidation. There is no pleural effusion or pneumothorax.
The heart and mediastinal contours are unremarkable.

There is no acute osseous abnormality.
IMPRESSION: No active cardiopulmonary disease.

## 2021-03-13 IMAGING — MR MR ABDOMEN WO/W CM
19 of 20 series · 45 of 48 positions shown · IV contrast (gadavist)
Comparison: 05/29/2019 MRI abdomen.

CLINICAL DATA: Chronic pancreatitis. History of
choledocho-duodenostomy in the bulb. History of SMV and portal
thrombosis. Upper endoscopy with EUS 08/10/2019 demonstrated
moderate to severe chronic pancreatitis with no focal pancreatic
mass, with question of 8 x 6 cm peripancreatic cystic lesion on EUS.
Elevated CA 19-9. No acute symptoms reported.

EXAM:
MRI ABDOMEN WITHOUT AND WITH CONTRAST
TECHNIQUE: Multiplanar multisequence MR imaging of the abdomen was performed
both before and after the administration of intravenous contrast.
CONTRAST:  7mL GADAVIST GADOBUTROL 1 MMOL/ML IV SOLN

[Series 5: cor haste · coronal · 6.0mm · 1.19mm/px · 2 of 30 slices shown]
[im 1/30]
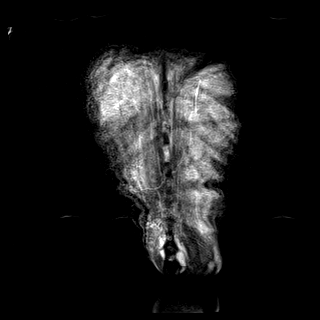
[im 30/30]
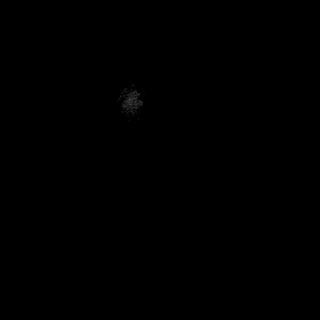

[Series 6: ax haste · axial · 6.0mm · 1.19mm/px · z∈[-98,+126]mm · 2 of 32 slices shown]
[im 1/32]
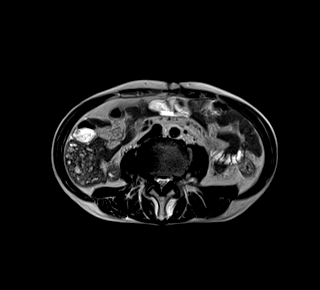
[im 32/32]
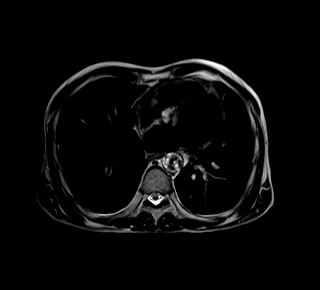

[Series 9: T2 fat-sat · axial · 6.0mm · 1.19mm/px · z∈[-89,+120]mm · 2 of 30 slices shown]
[im 1/30]
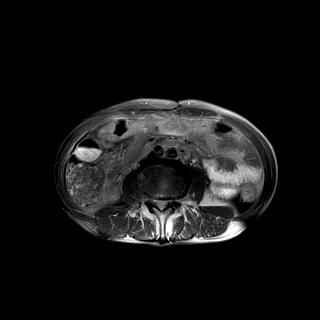
[im 30/30]
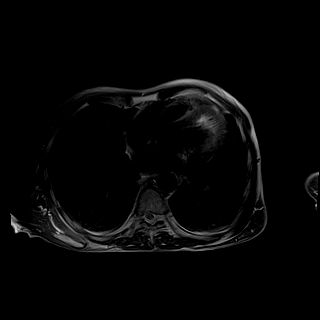

[Series 10: ax dwi_tracew · axial · 6.0mm · 1.42mm/px · z∈[-89,+120]mm · 4 of 90 slices shown]
[im 1/90]
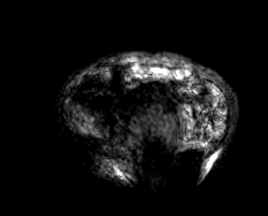
[im 30/90]
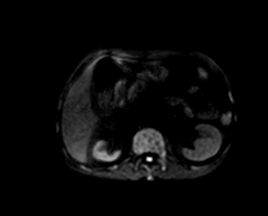
[im 60/90]
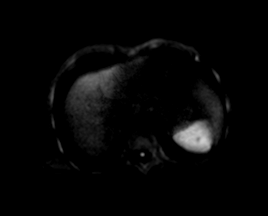
[im 90/90]
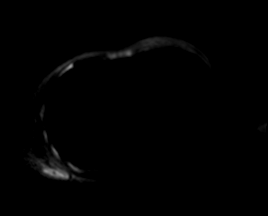

[Series 11: ax dwi_adc · axial · 6.0mm · 1.42mm/px · 1 of 30 slices shown]
[im 1/30]
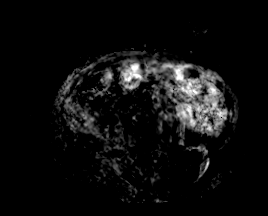

[Series 12: T1 · axial · 6.0mm · 0.74mm/px · 1 of 32 slices shown (1 of 2)]
[im 1/32]
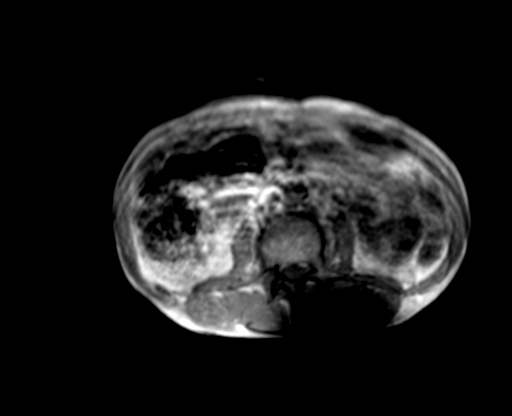

[Series 12: T1 · axial · 6.0mm · 0.74mm/px · 1 of 32 slices shown (2 of 2)]
[im 1/32]
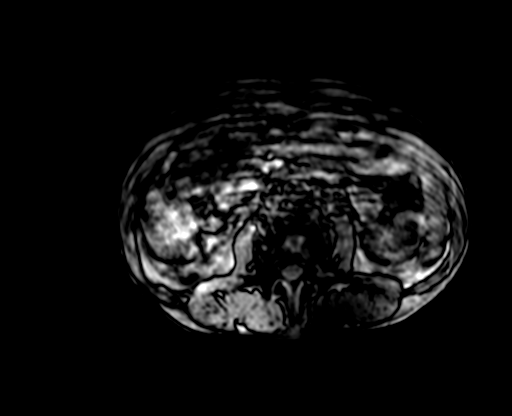

[Series 16: bSSFP · axial · 6.0mm · 0.74mm/px · 1 of 32 slices shown]
[im 1/32]
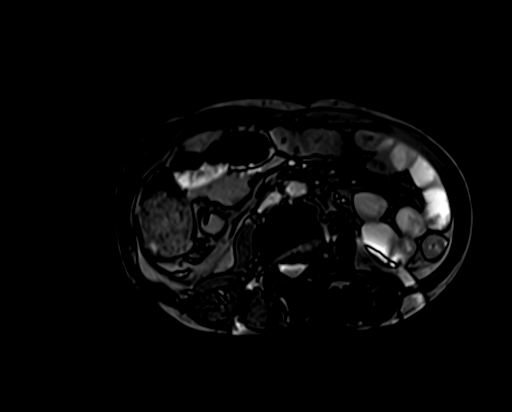

[Series 17: T1 dynamic fat-sat · axial · non-contrast · 3.0mm · 1.19mm/px · z∈[-84,+129]mm · 3 of 72 slices shown (1 of 5)]
[im 1/72]
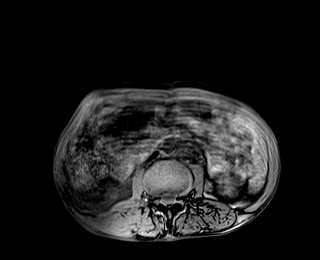
[im 36/72]
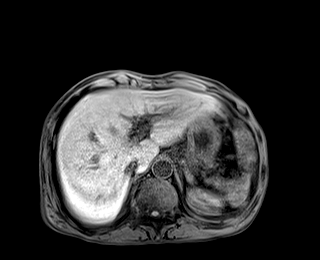
[im 72/72]
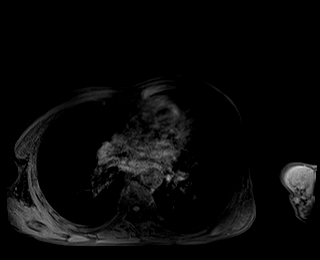

[Series 18: T1 dynamic fat-sat post-contrast · axial · 3.0mm · 1.19mm/px · z∈[-84,+129]mm · 3 of 72 slices shown (1 of 4)]
[im 1/72]
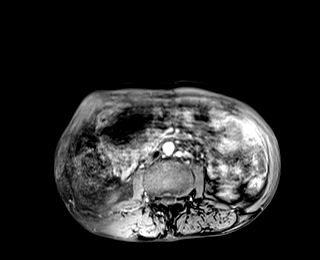
[im 36/72]
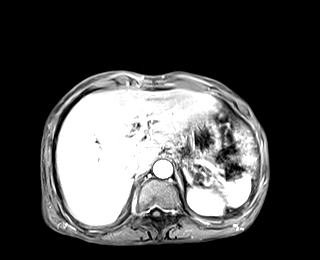
[im 72/72]
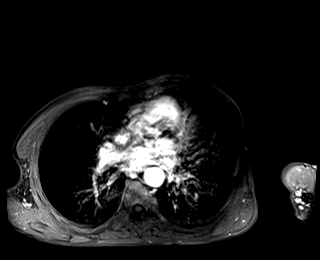

[Series 19: T1 dynamic fat-sat · axial · 3.0mm · 1.19mm/px · z∈[-84,+129]mm · 3 of 72 slices shown (2 of 5)]
[im 1/72]
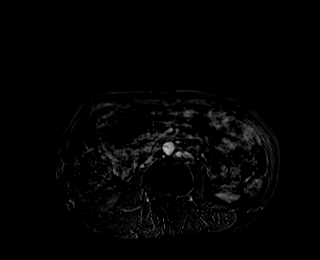
[im 36/72]
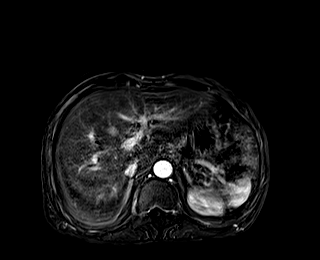
[im 72/72]
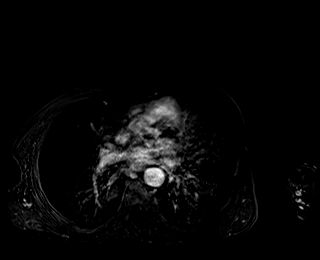

[Series 20: T1 dynamic fat-sat post-contrast · axial · 3.0mm · 1.19mm/px · z∈[-84,+129]mm · 3 of 72 slices shown (2 of 4)]
[im 1/72]
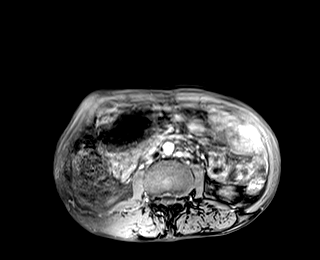
[im 36/72]
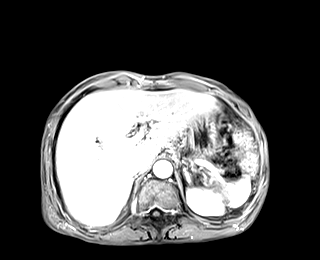
[im 72/72]
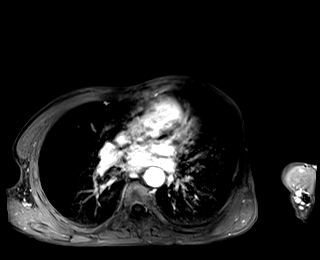

[Series 21: T1 dynamic fat-sat · axial · 3.0mm · 1.19mm/px · z∈[-84,+129]mm · 3 of 72 slices shown (3 of 5)]
[im 1/72]
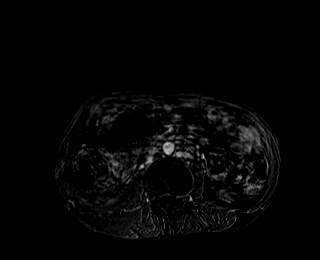
[im 36/72]
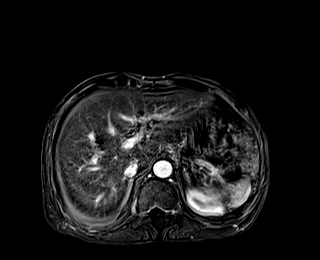
[im 72/72]
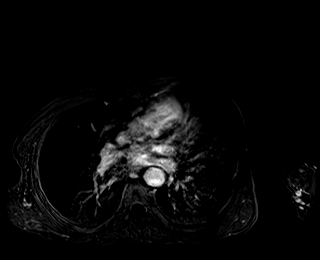

[Series 22: T1 dynamic fat-sat post-contrast · axial · 3.0mm · 1.19mm/px · z∈[-84,+129]mm · 3 of 72 slices shown (3 of 4)]
[im 1/72]
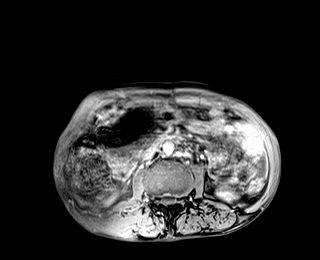
[im 36/72]
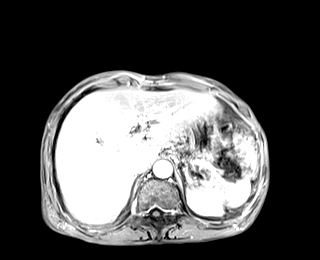
[im 72/72]
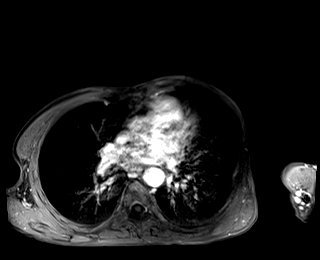

[Series 23: T1 dynamic fat-sat · axial · 3.0mm · 1.19mm/px · z∈[-84,+129]mm · 3 of 72 slices shown (4 of 5)]
[im 1/72]
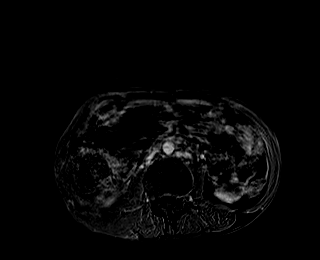
[im 36/72]
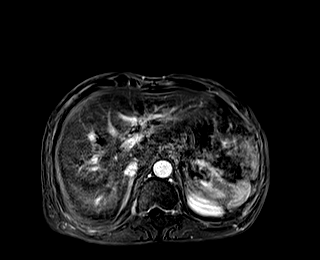
[im 72/72]
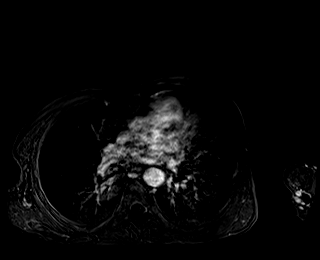

[Series 24: T1 dynamic post-contrast · coronal · 3.0mm · 1.31mm/px · 3 of 80 slices shown]
[im 1/80]
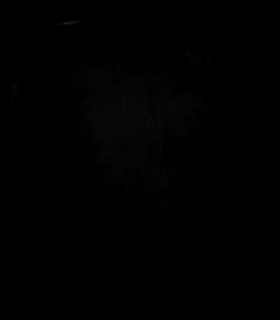
[im 40/80]
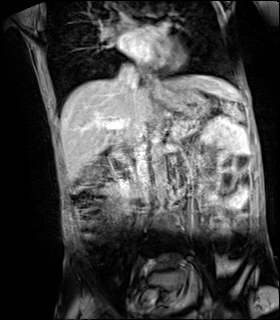
[im 80/80]
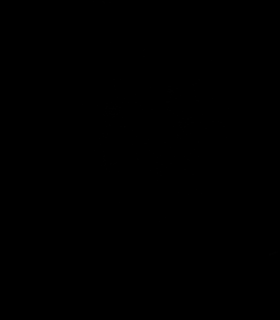

[Series 25: T1 dynamic fat-sat post-contrast · axial · 3.0mm · 1.19mm/px · z∈[-84,+129]mm · 3 of 72 slices shown (4 of 4)]
[im 1/72]
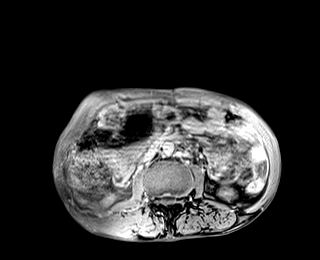
[im 36/72]
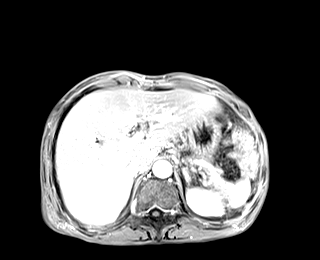
[im 72/72]
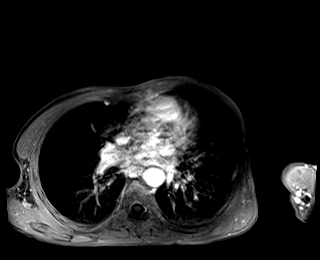

[Series 26: T1 dynamic fat-sat · axial · 3.0mm · 1.19mm/px · z∈[-84,+129]mm · 3 of 72 slices shown (5 of 5)]
[im 1/72]
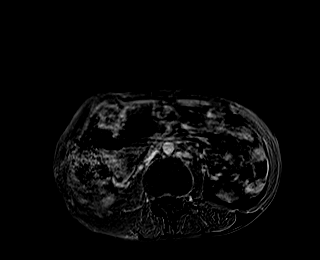
[im 36/72]
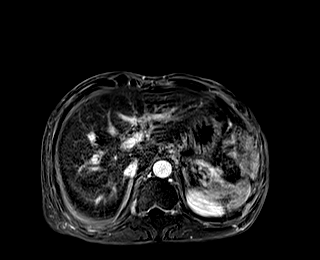
[im 72/72]
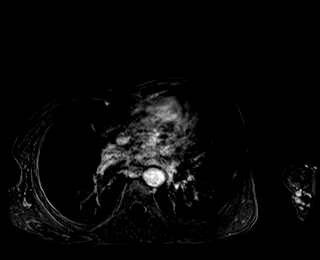

[Series 1020: MRCP · sagittal · 0.5mm · 0.47mm/px · 1 of 19 slices shown]
[im 1/19]
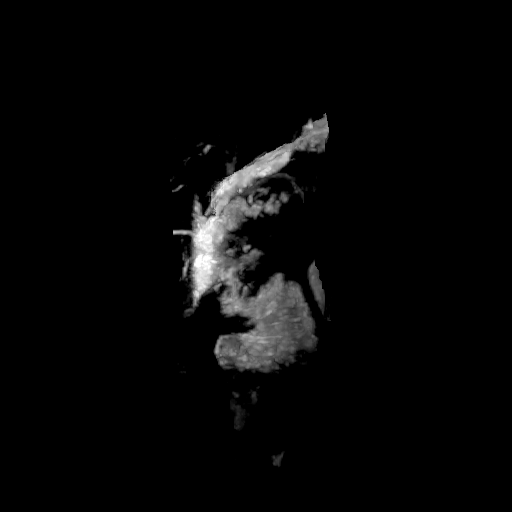

[45 of 48 positions shown; findings below may reference images not displayed]

FINDINGS: Substantially motion degraded scan, limiting assessment.

Lower chest: No acute abnormality at the lung bases.

Hepatobiliary: Normal liver size and configuration. No hepatic
steatosis. No liver mass. Cholecystectomy. Widely patent intact
choledocho-duodenostomy (series 14/image 24). Common hepatic duct
diameter 4 mm. Intrahepatic bile ducts are decompressed and stable.
No biliary filling defects. No beading of the intrahepatic bile
ducts.

Pancreas: Diffusely low T1 signal intensity and heterogeneous low T2
signal intensity in the pancreatic parenchyma. Diffusely irregular
pancreatic duct dilation up to 11 mm diameter. These findings are
unchanged and compatible with chronic pancreatitis. No discrete
pancreatic mass. No peripancreatic fluid collections or cystic
lesions. No definite pancreas divisum.

Spleen: Normal size. No mass.

Adrenals/Urinary Tract: Normal adrenals. Asymmetric right renal
atrophy is unchanged. Dilated right extrarenal pelvis with chronic
moderate right hydronephrosis, unchanged. Normal size left kidney.
No renal masses. No left hydronephrosis.

Stomach/Bowel: Normal non-distended stomach. Visualized small and
large bowel is normal caliber, with no bowel wall thickening.

Vascular/Lymphatic: Atherosclerotic nonaneurysmal abdominal aorta.
Patent hepatic and renal veins. High-grade venous narrowing at the
confluence of the main portal, splenic and SMV (series 20/image 49)
without discrete residual thrombosis. Mid to distal main portal vein
and right and left portal veins are patent. No pathologically
enlarged lymph nodes in the abdomen.

Other: No abdominal ascites or focal fluid collection.

Musculoskeletal: No aggressive appearing focal osseous lesions.
IMPRESSION: 1. Substantially motion degraded scan, limiting assessment.
2. Stable findings of chronic pancreatitis. No discrete pancreatic
mass. No peripancreatic fluid collections or cystic lesions.
3. Widely patent choledocho-duodenostomy in the bulb. No
intrahepatic biliary ductal dilation. No biliary filling defects.
4. High-grade venous narrowing at the confluence of the main portal,
splenic and SMV without discrete residual thrombosis.
5. Chronic moderate right hydronephrosis with asymmetric right renal
atrophy.

## 2021-08-31 IMAGING — US US EXTREM LOW VENOUS*L*
1 series · 13 of 24 positions shown · non-contrast
Comparison: None.

CLINICAL DATA: Left leg swelling for 1 week, pain



[Series 1: us venous img lower uni left (dvt) · portal-venous · 13 of 60 slices shown]
[im 1/60]
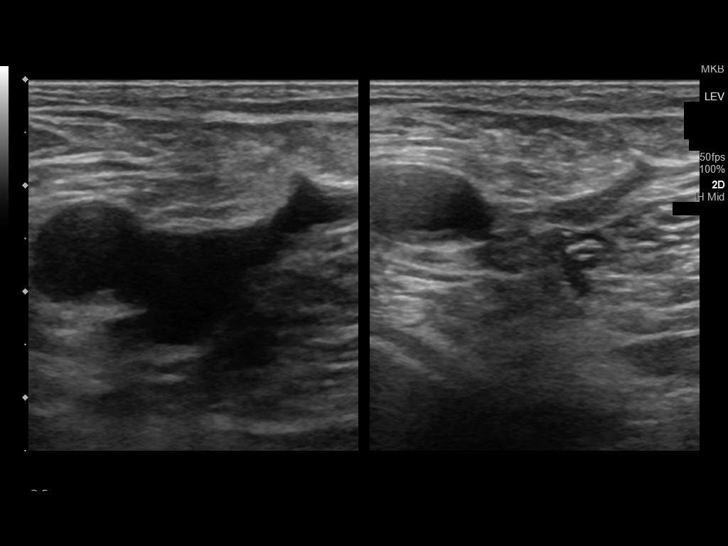
[im 6/60]
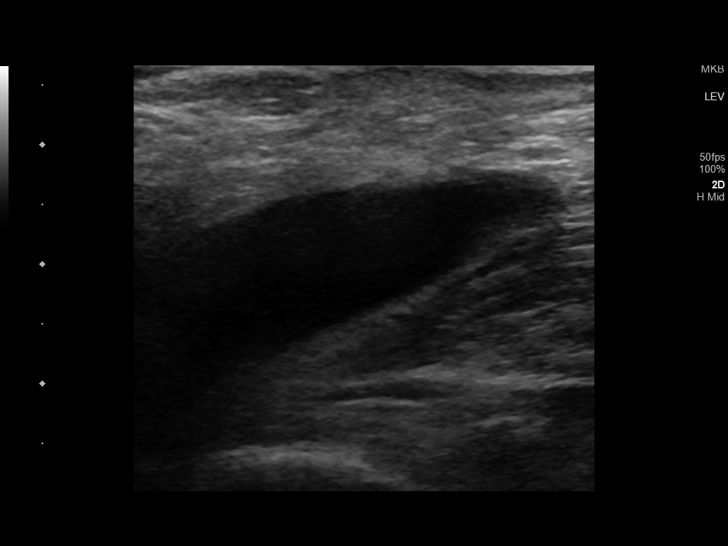
[im 11/60]
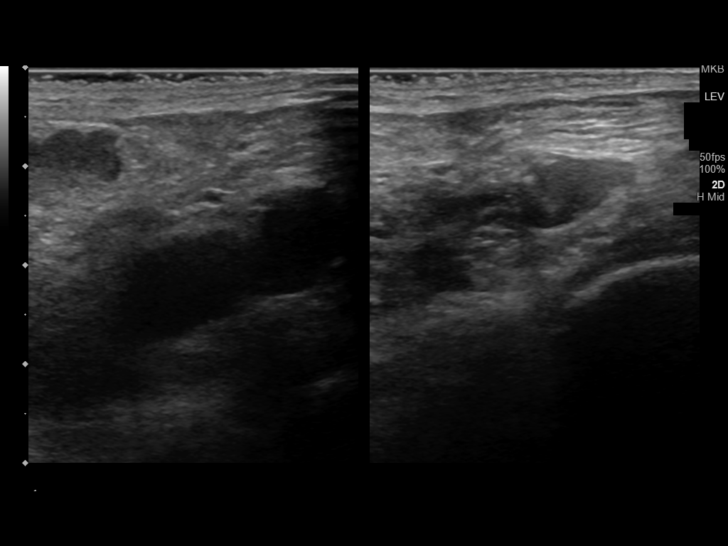
[im 16/60]
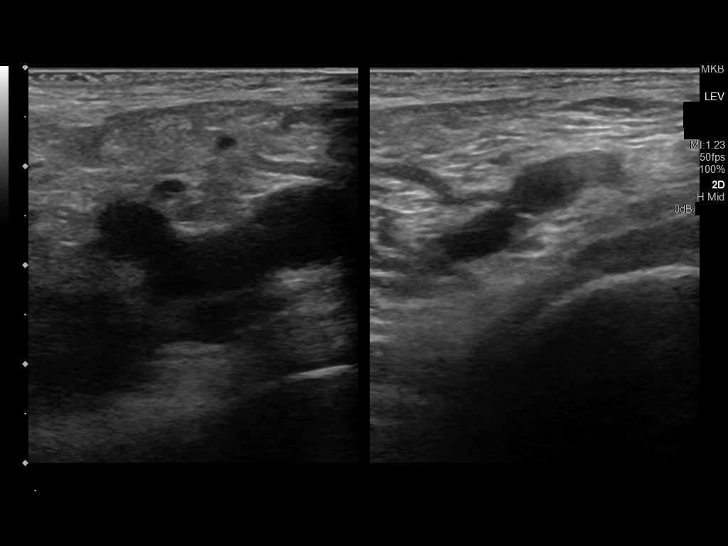
[im 21/60]
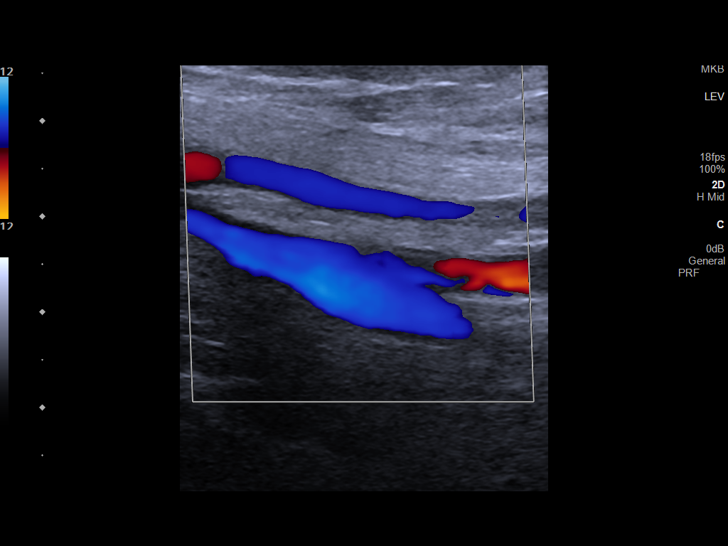
[im 26/60]
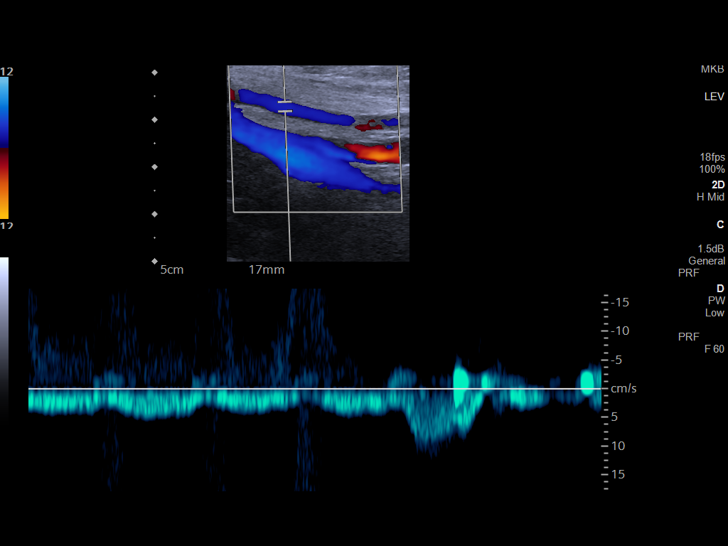
[im 31/60]
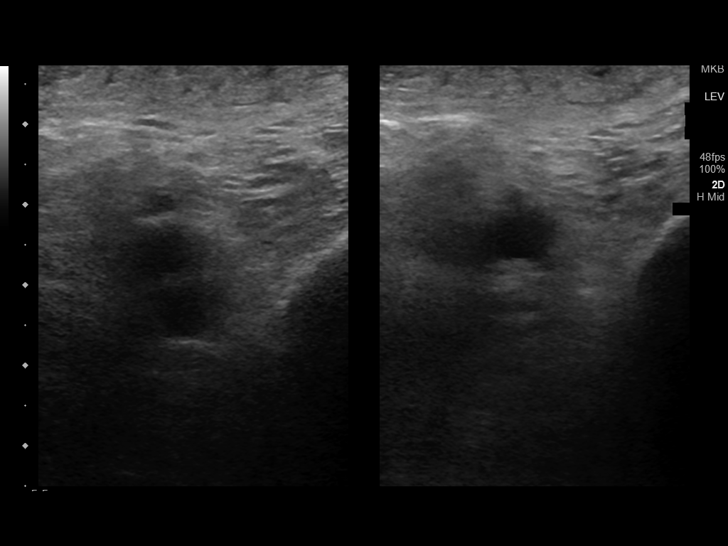
[im 34/60]
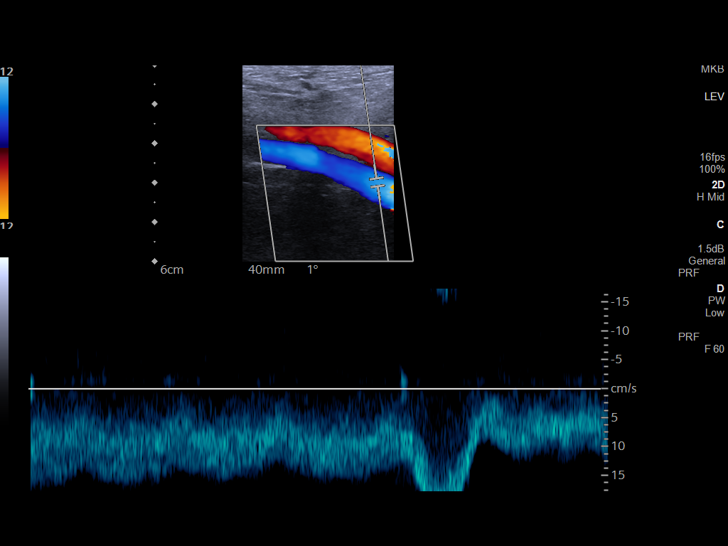
[im 39/60]
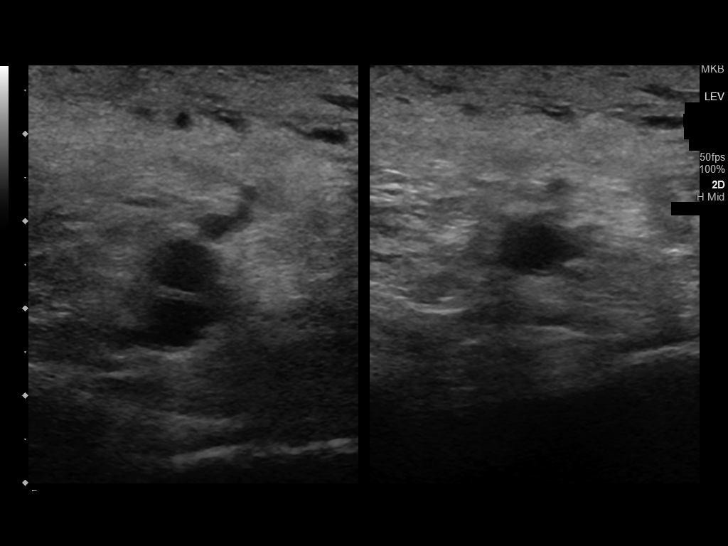
[im 44/60]
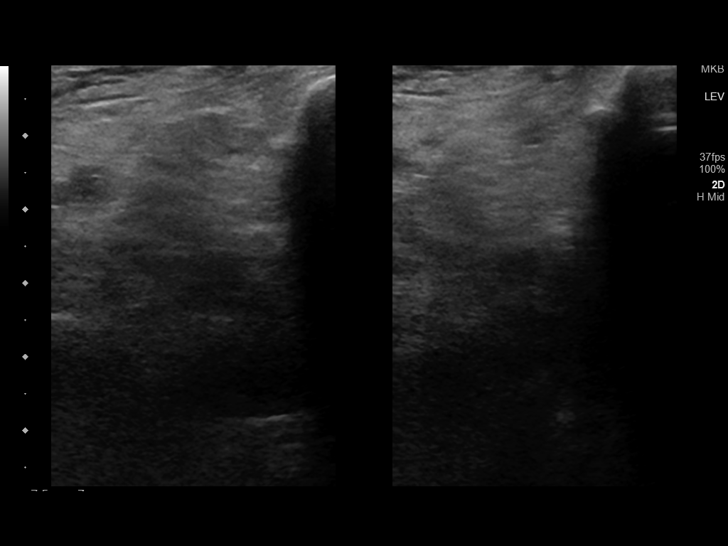
[im 49/60]
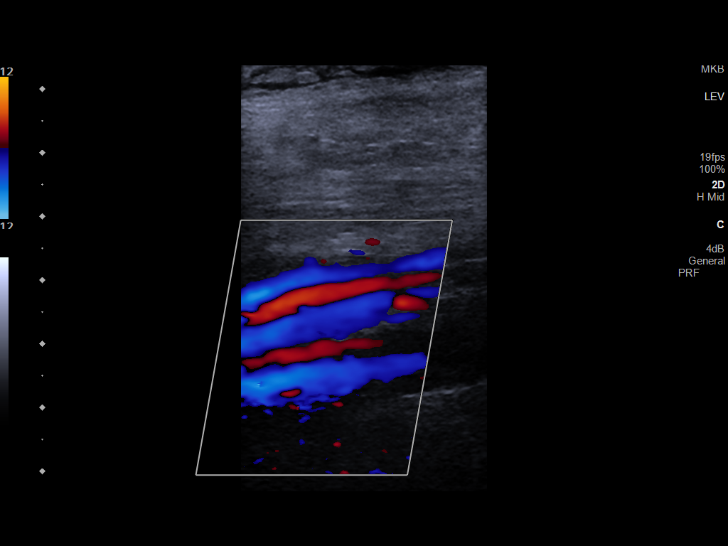
[im 54/60]
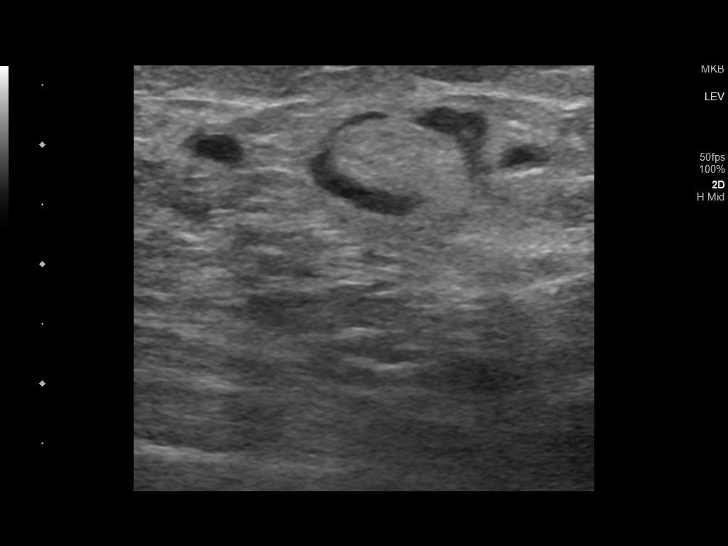
[im 60/60]
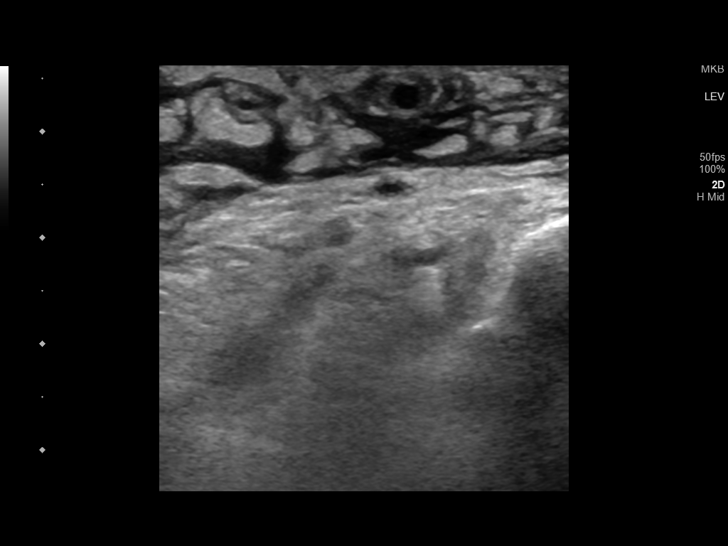

[13 of 24 positions shown; findings below may reference images not displayed]

FINDINGS: Contralateral Common Femoral Vein: Respiratory phasicity is normal
and symmetric with the symptomatic side. No evidence of thrombus.
Normal compressibility.

Common Femoral Vein: No evidence of thrombus. Normal
compressibility, respiratory phasicity and response to augmentation.

Saphenofemoral Junction: No evidence of thrombus. Normal
compressibility and flow on color Doppler imaging.

Profunda Femoral Vein: No evidence of thrombus. Normal
compressibility and flow on color Doppler imaging.

Femoral Vein: No evidence of thrombus. Normal compressibility,
respiratory phasicity and response to augmentation.

Popliteal Vein: No evidence of thrombus. Normal compressibility,
respiratory phasicity and response to augmentation.

Calf Veins: No evidence of thrombus. Normal compressibility and flow
on color Doppler imaging.

Other Findings: Mildly enlarged and elongated left inguinal lymph
nodes measuring up to 3.8 cm in length and 1.1 cm in short axis.
Despite these changes lymph nodes have preserved hypoechoic thin
cortex and fatty proliferation of the hila compatible with benign
lymph nodes suspect chronic reactive lymph nodes. Peripheral calf
edema noted.
IMPRESSION: Negative for left lower extremity DVT.

Prominent left inguinal lymph nodes as above

Peripheral calf edema

## 2021-08-31 IMAGING — CR DG TIBIA/FIBULA 2V*L*
1 series · 4 of 4 positions shown · non-contrast
Comparison: None.

CLINICAL DATA: Left lower extremity pain, swelling and redness.

EXAM:
LEFT TIBIA AND FIBULA - 2 VIEW

[Series 1: dg tibia/fibula left · 0.14mm/px · 4 of 4 slices shown]
[im 1/4]
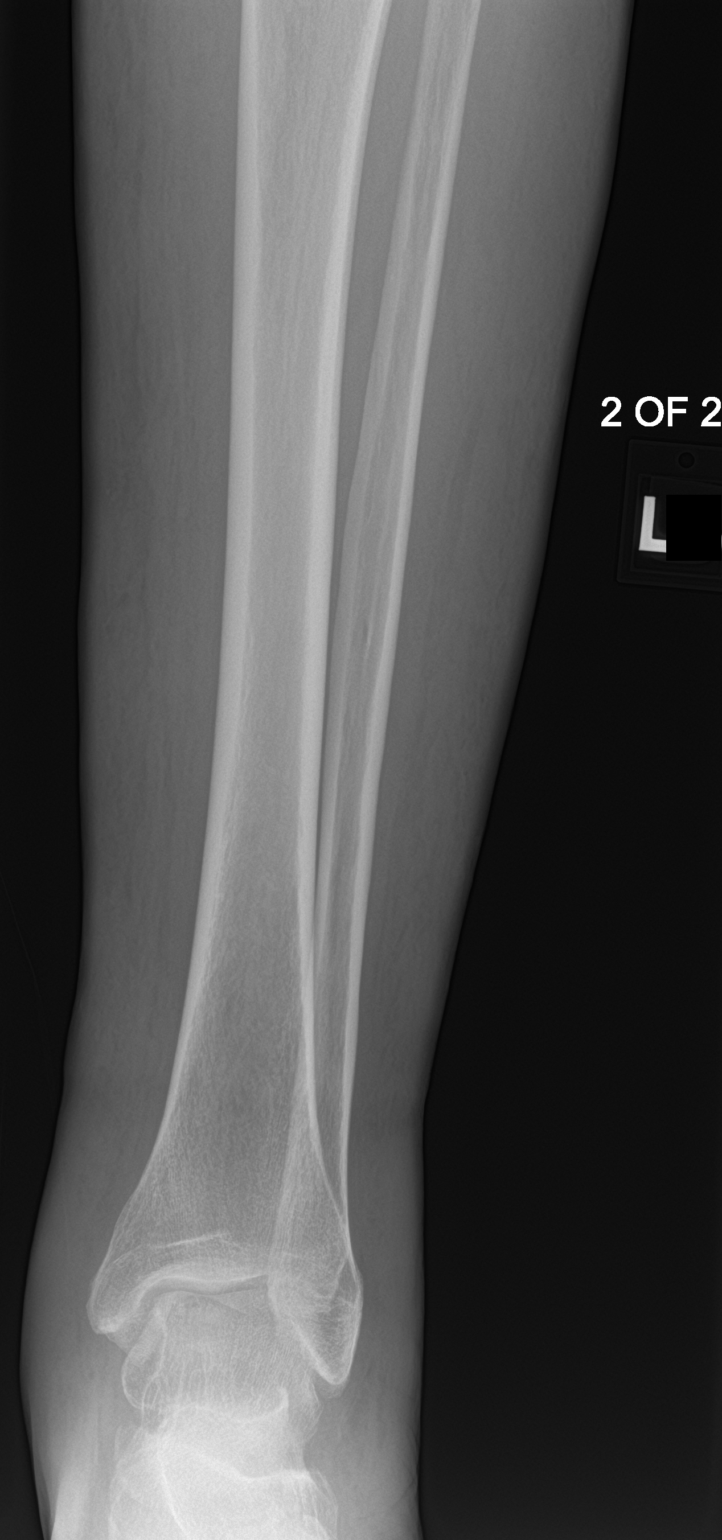
[im 2/4]
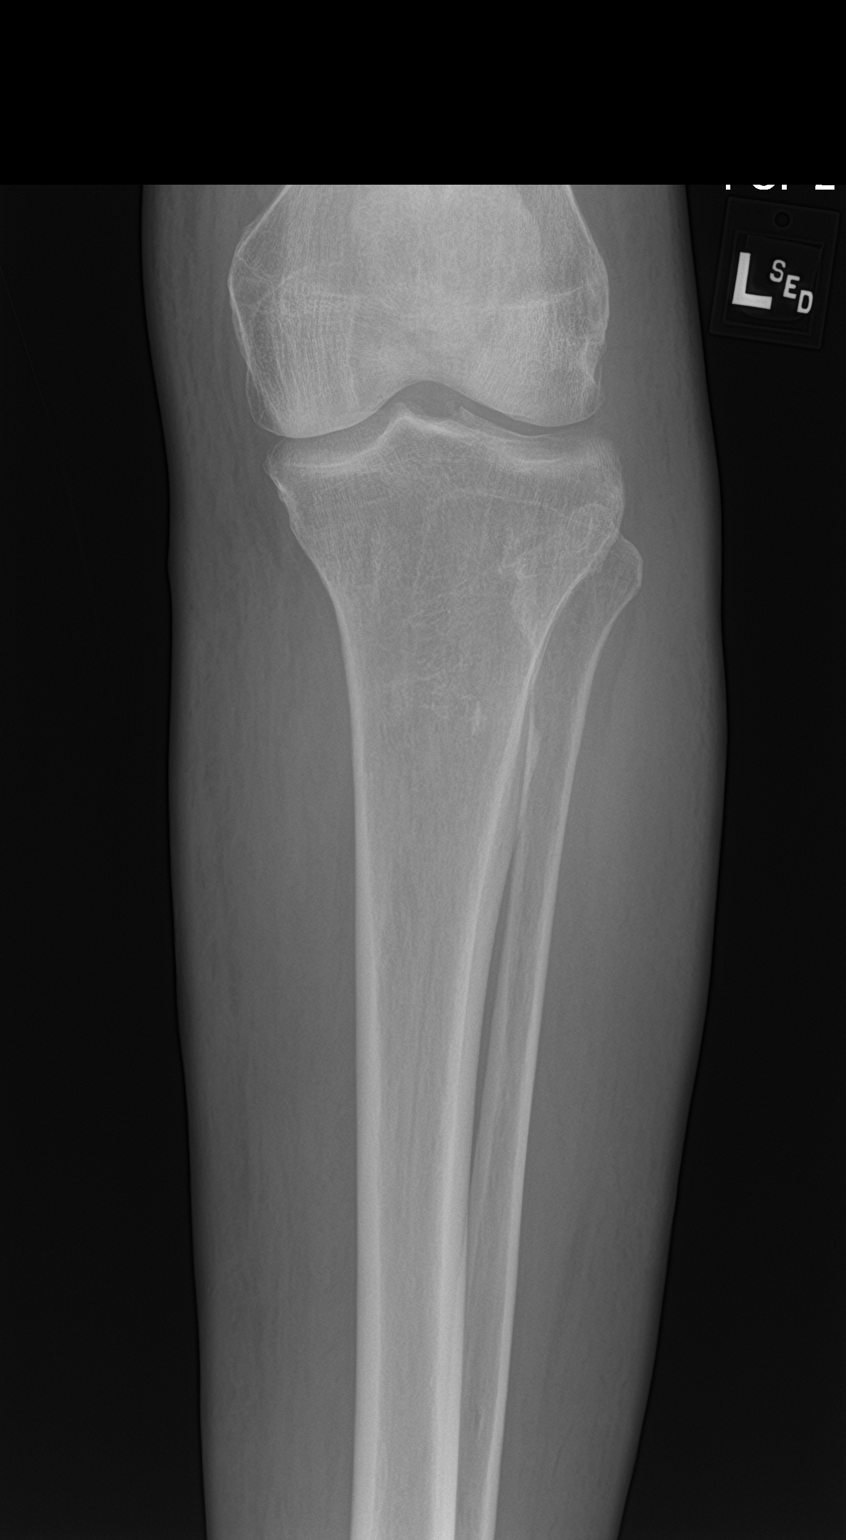
[im 3/4]
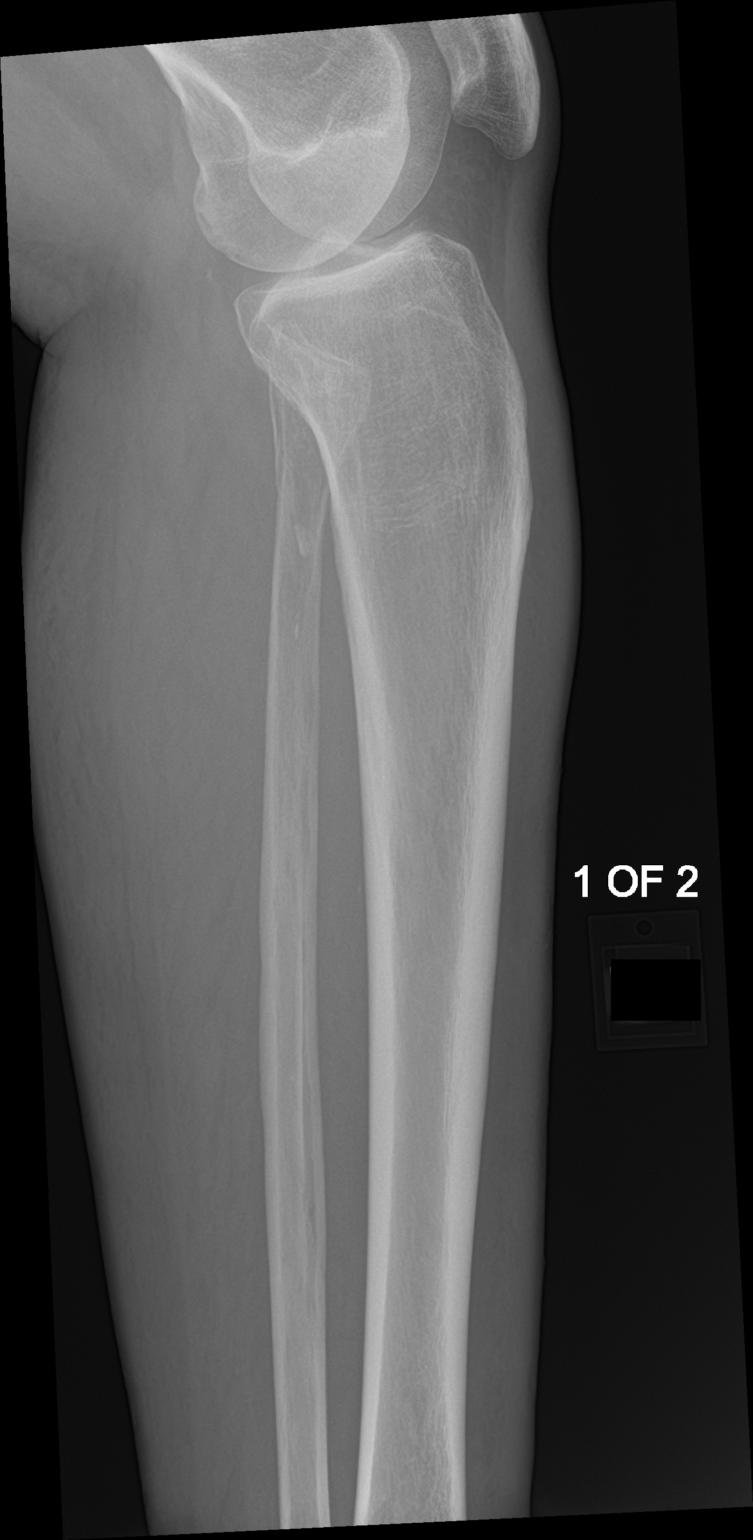
[im 4/4]
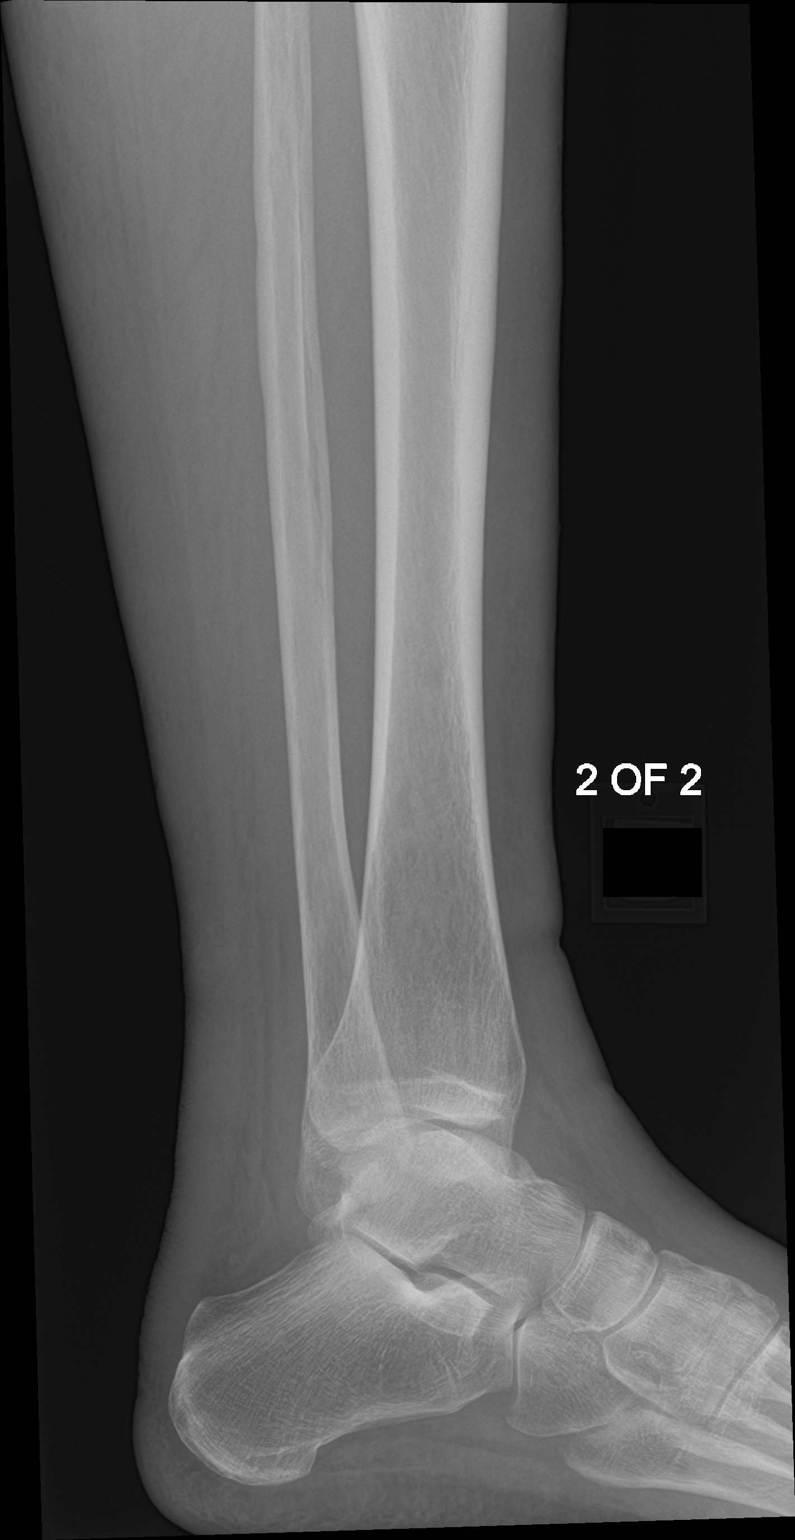

[4 of 4 positions shown; findings below may reference images not displayed]

FINDINGS: The knee and ankle joints are maintained. The tibia and fibula are
intact. No bone lesions or destructive bony changes.
IMPRESSION: No acute bony findings.

## 2022-05-05 ENCOUNTER — Observation Stay: Payer: Self-pay

## 2022-05-05 ENCOUNTER — Encounter: Payer: Self-pay | Admitting: Intensive Care

## 2022-05-05 ENCOUNTER — Other Ambulatory Visit: Payer: Self-pay

## 2022-05-05 ENCOUNTER — Observation Stay
Admission: EM | Admit: 2022-05-05 | Discharge: 2022-05-07 | Disposition: A | Discharge status: Home / Self Care | Payer: Self-pay | Attending: Internal Medicine | Admitting: Internal Medicine

## 2022-05-05 ENCOUNTER — Emergency Department: Payer: Self-pay

## 2022-05-05 DIAGNOSIS — M79671 Pain in right foot: Secondary | ICD-10-CM | POA: Insufficient documentation

## 2022-05-05 DIAGNOSIS — D509 Iron deficiency anemia, unspecified: Secondary | ICD-10-CM | POA: Diagnosis present

## 2022-05-05 DIAGNOSIS — F1410 Cocaine abuse, uncomplicated: Secondary | ICD-10-CM | POA: Insufficient documentation

## 2022-05-05 DIAGNOSIS — E871 Hypo-osmolality and hyponatremia: Secondary | ICD-10-CM | POA: Diagnosis present

## 2022-05-05 DIAGNOSIS — F17200 Nicotine dependence, unspecified, uncomplicated: Secondary | ICD-10-CM | POA: Insufficient documentation

## 2022-05-05 DIAGNOSIS — K8689 Other specified diseases of pancreas: Secondary | ICD-10-CM | POA: Diagnosis present

## 2022-05-05 DIAGNOSIS — F1490 Cocaine use, unspecified, uncomplicated: Secondary | ICD-10-CM | POA: Insufficient documentation

## 2022-05-05 DIAGNOSIS — Z86718 Personal history of other venous thrombosis and embolism: Secondary | ICD-10-CM | POA: Insufficient documentation

## 2022-05-05 DIAGNOSIS — I81 Portal vein thrombosis: Secondary | ICD-10-CM | POA: Diagnosis present

## 2022-05-05 DIAGNOSIS — Z79899 Other long term (current) drug therapy: Secondary | ICD-10-CM | POA: Insufficient documentation

## 2022-05-05 DIAGNOSIS — B880 Other acariasis: Secondary | ICD-10-CM | POA: Insufficient documentation

## 2022-05-05 DIAGNOSIS — R748 Abnormal levels of other serum enzymes: Secondary | ICD-10-CM | POA: Insufficient documentation

## 2022-05-05 DIAGNOSIS — B888 Other specified infestations: Secondary | ICD-10-CM | POA: Diagnosis present

## 2022-05-05 DIAGNOSIS — F17210 Nicotine dependence, cigarettes, uncomplicated: Secondary | ICD-10-CM | POA: Insufficient documentation

## 2022-05-05 DIAGNOSIS — K860 Alcohol-induced chronic pancreatitis: Secondary | ICD-10-CM | POA: Diagnosis present

## 2022-05-05 DIAGNOSIS — D649 Anemia, unspecified: Principal | ICD-10-CM | POA: Diagnosis present

## 2022-05-05 DIAGNOSIS — Z7901 Long term (current) use of anticoagulants: Secondary | ICD-10-CM | POA: Insufficient documentation

## 2022-05-05 LAB — URINALYSIS, ROUTINE W REFLEX MICROSCOPIC
Bilirubin Urine: NEGATIVE
Glucose, UA: NEGATIVE mg/dL
Ketones, ur: NEGATIVE mg/dL
Leukocytes,Ua: NEGATIVE
Nitrite: NEGATIVE
Protein, ur: NEGATIVE mg/dL
Specific Gravity, Urine: 1.005 (ref 1.005–1.030)
Squamous Epithelial / HPF: NONE SEEN /HPF (ref 0–5)
pH: 6 (ref 5.0–8.0)

## 2022-05-05 LAB — CBC WITH DIFFERENTIAL/PLATELET
Abs Immature Granulocytes: 0.04 10*3/uL (ref 0.00–0.07)
Basophils Absolute: 0.1 10*3/uL (ref 0.0–0.1)
Basophils Relative: 1 %
Eosinophils Absolute: 0.1 10*3/uL (ref 0.0–0.5)
Eosinophils Relative: 1 %
HCT: 18.3 % — ABNORMAL LOW (ref 39.0–52.0)
Hemoglobin: 5.7 g/dL — ABNORMAL LOW (ref 13.0–17.0)
Immature Granulocytes: 1 %
Lymphocytes Relative: 18 %
Lymphs Abs: 1.1 10*3/uL (ref 0.7–4.0)
MCH: 24.9 pg — ABNORMAL LOW (ref 26.0–34.0)
MCHC: 31.1 g/dL (ref 30.0–36.0)
MCV: 79.9 fL — ABNORMAL LOW (ref 80.0–100.0)
Monocytes Absolute: 0.9 10*3/uL (ref 0.1–1.0)
Monocytes Relative: 16 %
Neutro Abs: 3.8 10*3/uL (ref 1.7–7.7)
Neutrophils Relative %: 63 %
Platelets: 456 10*3/uL — ABNORMAL HIGH (ref 150–400)
RBC: 2.29 MIL/uL — ABNORMAL LOW (ref 4.22–5.81)
RDW: 16.2 % — ABNORMAL HIGH (ref 11.5–15.5)
WBC: 6 10*3/uL (ref 4.0–10.5)
nRBC: 0 % (ref 0.0–0.2)

## 2022-05-05 LAB — FOLATE: Folate: 14.8 ng/mL (ref >5.9)

## 2022-05-05 LAB — ABO/RH: ABO/RH(D): O POS

## 2022-05-05 LAB — TYPE AND SCREEN: Antibody Screen: NEGATIVE

## 2022-05-05 LAB — COMPREHENSIVE METABOLIC PANEL
ALT: 9 U/L (ref 0–44)
AST: 23 U/L (ref 15–41)
Albumin: 2.1 g/dL — ABNORMAL LOW (ref 3.5–5.0)
Alkaline Phosphatase: 140 U/L — ABNORMAL HIGH (ref 38–126)
Anion gap: 9 (ref 5–15)
BUN: 8 mg/dL (ref 8–23)
CO2: 19 mmol/L — ABNORMAL LOW (ref 22–32)
Calcium: 7.9 mg/dL — ABNORMAL LOW (ref 8.9–10.3)
Chloride: 93 mmol/L — ABNORMAL LOW (ref 98–111)
Creatinine, Ser: 0.93 mg/dL (ref 0.61–1.24)
GFR, Estimated: >60 mL/min (ref >60)
Glucose, Bld: 114 mg/dL — ABNORMAL HIGH (ref 70–99)
Potassium: 4 mmol/L (ref 3.5–5.1)
Sodium: 121 mmol/L — ABNORMAL LOW (ref 135–145)
Total Bilirubin: 0.5 mg/dL (ref 0.3–1.2)
Total Protein: 5.8 g/dL — ABNORMAL LOW (ref 6.5–8.1)

## 2022-05-05 LAB — HEMOGLOBIN AND HEMATOCRIT, BLOOD
HCT: 25.3 % — ABNORMAL LOW (ref 39.0–52.0)
HCT: 24.4 % — ABNORMAL LOW (ref 39.0–52.0)
Hemoglobin: 8.2 g/dL — ABNORMAL LOW (ref 13.0–17.0)
Hemoglobin: 7.8 g/dL — ABNORMAL LOW (ref 13.0–17.0)

## 2022-05-05 LAB — BPAM RBC
ISSUE DATE / TIME: 202404161454
ISSUE DATE / TIME: 202404161822

## 2022-05-05 LAB — ETHANOL: Alcohol, Ethyl (B): <10 mg/dL (ref <10)

## 2022-05-05 LAB — T4, FREE: Free T4: 0.91 ng/dL (ref 0.61–1.12)

## 2022-05-05 LAB — IRON AND TIBC
Iron: 16 ug/dL — ABNORMAL LOW (ref 45–182)
Saturation Ratios: 7 % — ABNORMAL LOW (ref 17.9–39.5)
TIBC: 241 ug/dL — ABNORMAL LOW (ref 250–450)
UIBC: 225 ug/dL

## 2022-05-05 LAB — TSH: TSH: 1.037 u[IU]/mL (ref 0.350–4.500)

## 2022-05-05 LAB — SODIUM, URINE, RANDOM: Sodium, Ur: 48 mmol/L

## 2022-05-05 LAB — VITAMIN B12: Vitamin B-12: 708 pg/mL (ref 180–914)

## 2022-05-05 LAB — MAGNESIUM: Magnesium: 1.9 mg/dL (ref 1.7–2.4)

## 2022-05-05 LAB — OSMOLALITY: Osmolality: 261 mOsm/kg — ABNORMAL LOW (ref 275–295)

## 2022-05-05 LAB — PREPARE RBC (CROSSMATCH)

## 2022-05-05 LAB — OSMOLALITY, URINE: Osmolality, Ur: 200 mOsm/kg — ABNORMAL LOW (ref 300–900)

## 2022-05-05 LAB — FERRITIN: Ferritin: 18 ng/mL — ABNORMAL LOW (ref 24–336)

## 2022-05-05 MED ORDER — ACETAMINOPHEN 325 MG PO TABS
650.0000 mg | ORAL_TABLET | Freq: Four times a day (QID) | ORAL | Status: DC | PRN
  Administered 2022-05-06 (×3): 650 mg via ORAL
  Filled 2022-05-05 (×3): qty 2

## 2022-05-05 MED ORDER — THIAMINE MONONITRATE 100 MG PO TABS
100.0000 mg | ORAL_TABLET | Freq: Every day | ORAL | Status: DC
  Administered 2022-05-06: 100 mg via ORAL
  Filled 2022-05-05: qty 1

## 2022-05-05 MED ORDER — SODIUM CHLORIDE 0.9 % IV SOLN
10.0000 mL/h | Freq: Once | INTRAVENOUS | Status: AC
  Administered 2022-05-05: 10 mL/h via INTRAVENOUS

## 2022-05-05 MED ORDER — ACETAMINOPHEN 650 MG RE SUPP: 650.0000 mg | Freq: Four times a day (QID) | RECTAL | Status: DC | PRN

## 2022-05-05 MED ORDER — THIAMINE HCL 100 MG/ML IJ SOLN
100.0000 mg | Freq: Once | INTRAMUSCULAR | Status: AC
  Administered 2022-05-05: 100 mg via INTRAVENOUS
  Filled 2022-05-05: qty 2

## 2022-05-05 MED ORDER — MELATONIN 5 MG PO TABS
5.0000 mg | ORAL_TABLET | Freq: Every evening | ORAL | Status: DC | PRN
  Administered 2022-05-05 – 2022-05-06 (×2): 5 mg via ORAL
  Filled 2022-05-05 (×2): qty 1

## 2022-05-05 MED ORDER — POLYETHYLENE GLYCOL 3350 17 G PO PACK: 17.0000 g | PACK | Freq: Every day | ORAL | Status: DC | PRN

## 2022-05-05 MED ORDER — SENNOSIDES-DOCUSATE SODIUM 8.6-50 MG PO TABS: 1.0000 | ORAL_TABLET | Freq: Every evening | ORAL | Status: DC | PRN

## 2022-05-05 MED ORDER — OXYCODONE HCL 5 MG PO TABS
5.0000 mg | ORAL_TABLET | Freq: Four times a day (QID) | ORAL | Status: AC | PRN
  Administered 2022-05-05 – 2022-05-06 (×2): 5 mg via ORAL
  Filled 2022-05-05 (×2): qty 1

## 2022-05-05 MED ORDER — HYDRALAZINE HCL 10 MG PO TABS: 10.0000 mg | ORAL_TABLET | Freq: Four times a day (QID) | ORAL | Status: DC | PRN

## 2022-05-05 MED ORDER — NICOTINE 14 MG/24HR TD PT24: 14.0000 mg | MEDICATED_PATCH | Freq: Every day | TRANSDERMAL | Status: DC | PRN

## 2022-05-05 MED ORDER — OXYCODONE-ACETAMINOPHEN 5-325 MG PO TABS
1.0000 | ORAL_TABLET | Freq: Once | ORAL | Status: AC
  Administered 2022-05-05: 1 via ORAL
  Filled 2022-05-05: qty 1

## 2022-05-05 MED ORDER — SODIUM CHLORIDE 0.9 % IV BOLUS
500.0000 mL | Freq: Once | INTRAVENOUS | Status: AC
  Administered 2022-05-05: 500 mL via INTRAVENOUS

## 2022-05-05 MED ORDER — LORAZEPAM 2 MG/ML IJ SOLN: 2.0000 mg | INTRAMUSCULAR | Status: DC | PRN

## 2022-05-05 MED ORDER — ONDANSETRON HCL 4 MG/2ML IJ SOLN: 4.0000 mg | Freq: Four times a day (QID) | INTRAMUSCULAR | Status: DC | PRN

## 2022-05-05 MED ORDER — ADULT MULTIVITAMIN W/MINERALS CH
1.0000 | ORAL_TABLET | Freq: Every day | ORAL | Status: DC
  Administered 2022-05-05 – 2022-05-06 (×2): 1 via ORAL
  Filled 2022-05-05 (×2): qty 1

## 2022-05-05 MED ORDER — NICOTINE 14 MG/24HR TD PT24
14.0000 mg | MEDICATED_PATCH | Freq: Once | TRANSDERMAL | Status: DC
  Administered 2022-05-05: 14 mg via TRANSDERMAL
  Filled 2022-05-05: qty 1

## 2022-05-05 MED ORDER — ONDANSETRON HCL 4 MG PO TABS: 4.0000 mg | ORAL_TABLET | Freq: Four times a day (QID) | ORAL | Status: DC | PRN

## 2022-05-05 NOTE — ED Notes (Signed)
First unit of blood complete.  

## 2022-05-05 NOTE — ED Provider Notes (Signed)
[facility] Provider Note    Event Date/Time   First MD Initiated Contact with Patient 05/05/22 1207     (approximate)   History   Fall and Leg Pain   HPI  Wyatt Montgomery is a 67 y.o. 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  Patient tells me 2 weeks ago he fell going into the bathroom forward does not think he hit his head.  Since that time he has had pain in the right lower extremity making it difficult for him to ambulate.  Denies numbness or weakness in the extremities.  He denies headache.  Does endorse feeling overall fatigued for some time.  Denies dyspnea or chest pain.  Denies any blood in his stool or black stool.   Patient has history of anemia for which she is followed by oncology in the past.  Last saw them in 2021.  Was treated with iron and noted to be iron deficient.  Was also to have a bone marrow biopsy but I do not see that he has had.  Previously was on Eliquis for portal venous thrombosis which she is not taking currently.  Patient is drinking 4-5 beers per day.  Last drink was yesterday.     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   Orthostatic hypotension 12/16/2019   Acute renal insufficiency 12/16/2019   Goals of care, counseling/discussion 10/16/2019   Leukopenia 10/11/2019   Elevated LFTs 10/11/2019   Normocytic anemia 08/14/2019   Iron deficiency anemia 08/14/2019   B12 deficiency 08/14/2019   Cyst of pancreas 08/14/2019   Weight loss 08/14/2019   Thrombosis of mesenteric vein    Pancreatic mass    Thrombosis, portal vein 05/29/2019   Alcohol-induced  chronic pancreatitis    Nausea and vomiting    Hyponatremia      Physical Exam  Triage Vital Signs: ED Triage Vitals [05/05/22 1152]  Enc Vitals Group     BP (!) 146/81     Pulse Rate 70     Resp 16     Temp 98.2 F (36.8 C)     Temp Source Oral     SpO2 100 %     Weight 145 lb (65.8 kg)     Height 5\' 10"  (1.778 m)     Head Circumference      Peak Flow      Pain Score 8     Pain Loc      Pain Edu?      Excl. in GC?     Most recent vital signs: Vitals:   05/05/22 1152  BP: (!) 146/81  Pulse: 70  Resp: 16  Temp: 98.2 F (36.8 C)  SpO2: 100%     General: Awake, no distress.  Chronically ill-appearing, thin CV:  Good peripheral perfusion. + LLE edema, no RLE edema Resp:  Normal effort.  Abd:  No distention. Soft nontender  Neuro:             Awake, Alert, Oriented x 3  Other:  2+ DP pulse bilateral lower extremities, no focal tenderness of right  ankle calf knee or hip, patient able to range ankle knee and hip normally External hemorrhoids that are nonbleeding on rectal exam, empty rectal vault   ED Results / Procedures / Treatments  Labs (all labs ordered are listed, but only abnormal results are displayed) Labs Reviewed  CBC WITH DIFFERENTIAL/PLATELET - Abnormal; Notable for the following components:      Result Value   RBC 2.29 (*)    Hemoglobin 5.7 (*)    HCT 18.3 (*)    MCV 79.9 (*)    MCH 24.9 (*)    RDW 16.2 (*)    Platelets 456 (*)    All other components within normal limits  COMPREHENSIVE METABOLIC PANEL - Abnormal; Notable for the following components:   Sodium 121 (*)    Chloride 93 (*)    CO2 19 (*)    Glucose, Bld 114 (*)    Calcium 7.9 (*)    Total Protein 5.8 (*)    Albumin 2.1 (*)    Alkaline Phosphatase 140 (*)    All other components within normal limits  IRON AND TIBC  FERRITIN  TSH  T4, FREE  MAGNESIUM  VITAMIN B12  FOLATE  OSMOLALITY  URINALYSIS, ROUTINE W REFLEX MICROSCOPIC  OSMOLALITY, URINE  SODIUM, URINE, RANDOM   TYPE AND SCREEN  PREPARE RBC (CROSSMATCH)  ABO/RH     EKG     RADIOLOGY I reviewed and interpreted the CXR which does not show any acute cardiopulmonary process    PROCEDURES:  Critical Care performed: No  .Critical Care  Performed by: Georga Hacking, MD Authorized by: Georga Hacking, MD   Critical care provider statement:    Critical care time (minutes):  30   Critical care was time spent personally by me on the following activities:  Development of treatment plan with patient or surrogate, discussions with consultants, evaluation of patient's response to treatment, examination of patient, ordering and review of laboratory studies, ordering and review of radiographic studies, ordering and performing treatments and interventions, pulse oximetry, re-evaluation of patient's condition and review of old charts   The patient is on the cardiac monitor to evaluate for evidence of arrhythmia and/or significant heart rate changes.   MEDICATIONS ORDERED IN ED: Medications  0.9 %  sodium chloride infusion (has no administration in time range)  nicotine (NICODERM CQ - dosed in mg/24 hours) patch 14 mg (has no administration in time range)  oxyCODONE-acetaminophen (PERCOCET/ROXICET) 5-325 MG per tablet 1 tablet (has no administration in time range)  sodium chloride 0.9 % bolus 500 mL (has no administration in time range)  thiamine (VITAMIN B1) injection 100 mg (has no administration in time range)     IMPRESSION / MDM / ASSESSMENT AND PLAN / ED COURSE  I reviewed the triage vital signs and the nursing notes.                              Patient's presentation is most consistent with acute presentation with potential threat to life or bodily function.  Differential diagnosis includes, but is not limited to, anemia secondary to malignancy, GI bleed, iron deficiency anemia, B12 or folate deficiency, low suspicion for acute GI bleeding  Patient is a 67 year old male with  alcohol use disorder chronic anemia presents today primarily with right lower extremity pain after fall 2 weeks ago.  He had a mechanical fall forward without hitting his head and since that time he had right  lower extremity pain and points to the calf area in the shin when asked about location of pain but on exam there is no swelling or focal tenderness he is able to range ankle knee and hip without difficulty.  He does note he has been very fatigued of late.  Labs were obtained from triage and are notable for hemoglobin of 5.7.  He has run around 7-8 in the past has never been this low.  I do see that in 2021 he did see oncology for iron deficiency anemia and also had pancreatic head mass which was thought to be due to his chronic pancreatitis.  Was supposed to have a bone marrow biopsy which he never had.  Was also previously on Eliquis for portal vein thrombosis but is not on this currently.  Unclear if he was taken off or if he is just noncompliant.  Patient has not seen a physician in over 2 years.     Denies any active bleeding denies blood in his stool or black stool or hematemesis.  Vital signs are reassuring on exam he looks chronically ill but nontoxic.  I have low suspicion for fracture based on his extremity exam.  Abdomen is benign no CT or L-spine tenderness no signs of trauma to his head and he is neurologically intact.  On rectal exam he has an empty rectal vault with external hemorrhoids unable to obtain for guaiac.  I have sent ferritin TIBC B12 and folate levels.  Will type and screen and give 2 units of blood.  Patient CMP is notable for hemoglobin of 121.  Does not look particularly dry but is also not been taking much.  Suspect this is probably beer potomania.  I have given him a dose of IV thiamine and will add on some urine osmole's and sodium and give a small bolus of fluid.  Discussed with the hospitalist for admission.     FINAL CLINICAL IMPRESSION(S) / ED DIAGNOSES   Final  diagnoses:  Anemia, unspecified type  Hyponatremia     Rx / DC Orders   ED Discharge Orders     None        Note:  This document was prepared using Dragon voice recognition software and may include unintentional dictation errors.   Georga Hacking, MD 05/05/22 [phone]

## 2022-05-05 NOTE — Assessment & Plan Note (Addendum)
Patient has not had outpatient workup Patient will need to be follow-up by outpatient PCP for further outpatient workup

## 2022-05-05 NOTE — Assessment & Plan Note (Addendum)
History of hypochromic microcytic anemia Baseline hemoglobin from 2 years ago was 7.6-9.2 He states he stopped taking his iron tablets for a while because he ran out of it at home

## 2022-05-05 NOTE — Assessment & Plan Note (Signed)
Appears chronic since 2022 Recommend outpatient follow-up regarding pancreatic mass, discussed with patient

## 2022-05-05 NOTE — Assessment & Plan Note (Addendum)
At bedside, patient was able to flex and extend his right ankle, and there was negative visual evidence of bleeding, pulses are intact bilaterally Symptomatic support with acetaminophen 650 p.o./IV every 6 hours as needed for mild pain, fever, headache Oxycodone 5 mg p.o. every 6 hours as needed for moderate and severe pain, 15 hours coverage ordered Right foot x-ray ordered

## 2022-05-05 NOTE — Hospital Course (Signed)
Mr. Wyatt Montgomery is a 67 year old male with history of alcohol use disorder and dependence, chronic pancreatitis, with pancreatic head mass, iron deficiency anemia, history of portal vein thrombosis previously on Eliquis, tobacco/nicotine dependence, who presents to the emergency concerns of falling about 1 week ago and difficulty ambulating since then along with generalized fatigue and weakness.  Vitals show temperature 98.2, respiration rate 16, heart rate 70, blood pressure 146/81, SpO2 100% on room air.  Serum sodium is 121, potassium 4.0, chloride 93, bicarb 19, BUN of 8, serum creatinine 0.93, EGFR greater than 60, nonfasting blood glucose 114, WBC 6.0, hemoglobin 5.7, platelets of 456.  Anemia panel has been ordered by EDP and pending collection at the time of this dictation.  ED treatment: Thiamine 100 mg IV one-time dose, oxycodone-acetaminophen 5-325 mg p.o. one-time dose, nicotine patch, sodium chloride 500 mL bolus, 2 units of PRBC have been ordered and pending administration.

## 2022-05-05 NOTE — Assessment & Plan Note (Signed)
Patient had nicotine patch ordered by EDP Nicotine patch as needed for nicotine craving ordered on admission

## 2022-05-05 NOTE — Assessment & Plan Note (Signed)
History of cimex infestation

## 2022-05-05 NOTE — ED Triage Notes (Signed)
Patient reports mechanical fall X1 week ago. C/o right leg pain that he describes as a "toothache." Also reports he has not been able to walk like normal since the fall. Denies LOC or hitting head  A&O x4 in triage

## 2022-05-05 NOTE — ED Notes (Signed)
RN called lab to draw additional labs. Unable to obtain from IV.

## 2022-05-05 NOTE — Assessment & Plan Note (Addendum)
Asymptomatic Presumed hypervolemic in setting of daily beer use Urine osmolality, random urine sodium, serum osmolality has been ordered by EDP and pending collection BMP in the a.m.

## 2022-05-05 NOTE — Assessment & Plan Note (Addendum)
Check serum alcohol level Patient reports his last drink was yesterday 05/04/2022, low likelihood of alcohol withdrawal at this time Ativan 2 mg IV every 4 hours as needed for anxiety, 24 hours ordered AM team to initiate alcohol CIWA precaution when appropriate

## 2022-05-05 NOTE — H&P (Addendum)
History and Physical   Wyatt Montgomery UJW:[id number] DOB: 06/16/1955 DOA: 05/05/2022  PCP: Pcp, No  Patient coming from: home   I have personally briefly reviewed patient's old medical records in [facility] EMR.  Chief Concern: Fall, right leg pain, generalized weakness  HPI: Wyatt Montgomery is a 67 year old male with history of alcohol use disorder and dependence, chronic pancreatitis, with pancreatic head mass, iron deficiency anemia, history of portal vein thrombosis previously on Eliquis, tobacco/nicotine dependence, who presents to the emergency concerns of falling about 1 week ago and difficulty ambulating since then along with generalized fatigue and weakness.  Vitals show temperature 98.2, respiration rate 16, heart rate 70, blood pressure 146/81, SpO2 100% on room air.  Serum sodium is 121, potassium 4.0, chloride 93, bicarb 19, BUN of 8, serum creatinine 0.93, EGFR greater than 60, nonfasting blood glucose 114, WBC 6.0, hemoglobin 5.7, platelets of 456.  Anemia panel has been ordered by EDP and pending collection at the time of this dictation.  ED treatment: Thiamine 100 mg IV one-time dose, oxycodone-acetaminophen 5-325 mg p.o. one-time dose, nicotine patch, sodium chloride 500 mL bolus, 2 units of PRBC have been ordered and pending administration. ------------------------------ At bedside, patient was able to tell me his name, place, current location, current calendar year.  He reports that he fell tripping in the bathroom about 2 weeks ago and hurt his right foot.  He was able to ambulate since then however has been having difficulty not ambulating like baseline.  He denies fever, chills, chest pain, shortness of breath, abdominal pain, dysuria, hematuria, diarrhea.  Denies black stools and or bloody bowel movements.  Reports that he used to take iron tablets however he stopped taking it because he ran out.  He denies nausea, vomiting.    Social history: He lives at  home with his wife.  He reports he drinks 3-4 beers daily, last drink was 05/04/2022.  He smokes about 5 cigarettes/day.  He last used cocaine, smoking it, about a week ago.  ROS: Constitutional: no weight change, no fever ENT/Mouth: no sore throat, no rhinorrhea Eyes: no eye pain, no vision changes Cardiovascular: no chest pain, no dyspnea,  no edema, no palpitations Respiratory: no cough, no sputum, no wheezing Gastrointestinal: no nausea, no vomiting, no diarrhea, no constipation Genitourinary: no urinary incontinence, no dysuria, no hematuria Musculoskeletal: no arthralgias, no myalgias Skin: no skin lesions, no pruritus, Neuro: + weakness, no loss of consciousness, no syncope Psych: no anxiety, no depression, no decrease appetite Heme/Lymph: no bruising, no bleeding  ED Course: Discussed with emergency medicine provider, patient requiring hospitalization for chief concerns of acute symptomatic anemia.  Assessment/Plan  Principal Problem:   Symptomatic anemia Active Problems:   Thrombosis, portal vein   Alcohol-induced chronic pancreatitis   Hyponatremia   Pancreatic mass   Iron deficiency anemia   Infestation by cimex   Tobacco dependence   Elevated alkaline phosphatase level   Right foot pain   Cocaine use   Assessment and Plan:  * Symptomatic anemia Acute severe on chronic symptomatic anemia Anemia panel pending at this time EKG ordered on admission EDP has ordered 2 units of PRBC pending transfusion at this time Timed hemoglobin/hematocrit for day of admission at 1800 ordered Goal hemoglobin greater than 8 Repeat CBC in the a.m. Admit to telemetry medical, observation  Cocaine use He reports he lasted used it about one week ago Counseling given for cessation of crack cocaine use  Right foot pain At bedside, patient  was able to flex and extend his right ankle, and there was negative visual evidence of bleeding, pulses are intact bilaterally Symptomatic  support with acetaminophen 650 p.o./IV every 6 hours as needed for mild pain, fever, headache Oxycodone 5 mg p.o. every 6 hours as needed for moderate and severe pain, 15 hours coverage ordered Right foot x-ray ordered  Elevated alkaline phosphatase level Appears chronic since 2022 Recommend outpatient follow-up regarding pancreatic mass, discussed with patient  Tobacco dependence Patient had nicotine patch ordered by EDP Nicotine patch as needed for nicotine craving ordered on admission  Infestation by cimex History of cimex infestation  Iron deficiency anemia History of hypochromic microcytic anemia Baseline hemoglobin from 2 years ago was 7.6-9.2 He states he stopped taking his iron tablets for a while because he ran out of it at home  Pancreatic mass Patient has not had outpatient workup Patient will need to be follow-up by outpatient PCP for further outpatient workup  Hyponatremia Asymptomatic Presumed hypervolemic in setting of daily beer use Urine osmolality, random urine sodium, serum osmolality has been ordered by EDP and pending collection BMP in the a.m.  Alcohol-induced chronic pancreatitis Check serum alcohol level Patient reports his last drink was yesterday 05/04/2022, low likelihood of alcohol withdrawal at this time Ativan 2 mg IV every 4 hours as needed for anxiety, 24 hours ordered AM team to initiate alcohol CIWA precaution when appropriate  Chart reviewed.   DVT prophylaxis: TED hose Code Status: full code Diet: Heart healthy Family Communication: no, he says he will tell his wife Disposition Plan: Pending clinical course Consults called: None at this time Admission status: Telemetry medical, observation  Past Medical History:  Diagnosis Date   Pancreatic mass    Thrombosis, portal vein    Past Surgical History:  Procedure Laterality Date   ABDOMINAL SURGERY     EUS N/A 08/10/2019   Procedure: FULL UPPER ENDOSCOPIC ULTRASOUND (EUS) RADIAL;   Surgeon: Rayann Heman, MD;  Location: [facility] ENDOSCOPY;  Service: Endoscopy;  Laterality: N/A;   Social History:  reports that he has been smoking cigarettes. He has a 10.00 pack-year smoking history. He has never used smokeless tobacco. He reports current alcohol use of about 28.0 standard drinks of alcohol per week. He reports current drug use. Drug: "Crack" cocaine.  No Known Allergies Family History  Problem Relation Age of Onset   Hypertension Father    Family history: Family history reviewed and not pertinent  Prior to Admission medications   Medication Sig Start Date End Date Taking? Authorizing Provider  acetaminophen (TYLENOL) 325 MG tablet Take 650 mg by mouth every 6 (six) hours as needed.    [provider]  furosemide (LASIX) 20 MG tablet Take 1 tablet (20 mg total) by mouth daily. 04/01/20 04/01/21  Joseph Art, DO   Physical Exam: Vitals:   05/05/22 1743 05/05/22 1822 05/05/22 1930 05/05/22 1953  BP: (!) 183/84 (!) 167/81 (!) 168/92 (!) 170/90  Pulse: 62 71 66 60  Resp: 18 16 19 18   Temp: 98 F (36.7 C) 98 F (36.7 C) 98.6 F (37 C) 97.6 F (36.4 C)  TempSrc:  Oral  Oral  SpO2: 100% 100% 100% 100%  Weight:      Height:       Constitutional: appears older than chronological age, frail, cachectic, NAD, calm, comfortable, poor hygiene, positive for body odor Eyes: PERRL, lids and conjunctivae normal ENMT: Mucous membranes are moist. Posterior pharynx clear of any exudate or lesions. Age-appropriate dentition.  Hearing appropriate Neck: normal, supple, no masses, no thyromegaly Respiratory: clear to auscultation bilaterally, no wheezing, no crackles. Normal respiratory effort. No accessory muscle use.  Cardiovascular: Regular rate and rhythm, no murmurs / rubs / gallops. No extremity edema. 2+ pedal pulses. No carotid bruits.  Abdomen: scaphoid abdomen, no tenderness, no masses palpated, no hepatosplenomegaly. Bowel sounds positive.  Musculoskeletal: no  clubbing / cyanosis. No joint deformity upper and lower extremities. Good ROM, no contractures, no atrophy. Normal muscle tone.  Skin: no rashes, lesions, ulcers. No induration Neurologic: Sensation intact. Strength 5/5 in all 4.  Psychiatric: Normal judgment and insight. Alert and oriented x 3. Normal mood.   EKG: independently reviewed, showing sinus rhythm with a rate of 65, QTc 451  Chest x-ray on Admission: I personally reviewed and I agree with radiologist reading as below.  DG Foot 2 Views Right  Result Date: 05/05/2022 CLINICAL DATA:  Foot pain after fall. EXAM: RIGHT FOOT - 2 VIEW COMPARISON:  None Available. FINDINGS: There is no evidence of fracture or dislocation. Slight talonavicular spurring. No erosive change. Soft tissues are unremarkable. IMPRESSION: No fracture or dislocation of the right foot. Electronically Signed   By: Narda Rutherford M.D.   On: 05/05/2022 20:05   DG Chest 2 View  Result Date: 05/05/2022 CLINICAL DATA:  Chest pain EXAM: CHEST - 2 VIEW COMPARISON:  CXR 05/29/19 FINDINGS: No pleural effusion. No pneumothorax. Normal cardiac and mediastinal contours. No focal airspace opacity. No radiographically apparent displaced rib fractures. Visualized upper abdomen is unremarkable. Vertebral body heights are maintained. IMPRESSION: No focal airspace opacity. Electronically Signed   By: Lorenza Cambridge M.D.   On: 05/05/2022 13:40    Labs on Admission: I have personally reviewed following labs  CBC: Recent Labs  Lab 05/05/22 1154 05/05/22 1717 05/05/22 1948  WBC 6.0  --   --   NEUTROABS 3.8  --   --   HGB 5.7* 8.2* 7.8*  HCT 18.3* 25.3* 24.4*  MCV 79.9*  --   --   PLT 456*  --   --    Basic Metabolic Panel: Recent Labs  Lab 05/05/22 1154  NA 121*  K 4.0  CL 93*  CO2 19*  GLUCOSE 114*  BUN 8  CREATININE 0.93  CALCIUM 7.9*  MG 1.9   GFR: Estimated Creatinine Clearance: 74.7 mL/min (by C-G formula based on SCr of 0.93 mg/dL).  Liver Function  Tests: Recent Labs  Lab 05/05/22 1154  AST 23  ALT 9  ALKPHOS 140*  BILITOT 0.5  PROT 5.8*  ALBUMIN 2.1*   Anemia Panel: Recent Labs    05/05/22 1154  FOLATE 14.8  FERRITIN 18*  TIBC 241*  IRON 16*    Urine analysis:    Component Value Date/Time   COLORURINE STRAW (A) 05/05/2022 1401   APPEARANCEUR CLEAR (A) 05/05/2022 1401   LABSPEC 1.005 05/05/2022 1401   PHURINE 6.0 05/05/2022 1401   GLUCOSEU NEGATIVE 05/05/2022 1401   HGBUR LARGE (A) 05/05/2022 1401   BILIRUBINUR NEGATIVE 05/05/2022 1401   KETONESUR NEGATIVE 05/05/2022 1401   PROTEINUR NEGATIVE 05/05/2022 1401   NITRITE NEGATIVE 05/05/2022 1401   LEUKOCYTESUR NEGATIVE 05/05/2022 1401   This document was prepared using Dragon Voice Recognition software and may include unintentional dictation errors.  Dr. Sedalia Muta [facility]  If 7PM-7AM, please contact overnight-coverage provider If 7AM-7PM, please contact day coverage provider [email or web address]  05/05/2022, 8:24 PM

## 2022-05-05 NOTE — ED Notes (Signed)
RN to bedside. Pt states his IV came out. RN applied dressing to IV site. Will place additional IV due to patient receiving blood.

## 2022-05-05 NOTE — Assessment & Plan Note (Signed)
He reports he lasted used it about one week ago Counseling given for cessation of crack cocaine use

## 2022-05-05 NOTE — Assessment & Plan Note (Addendum)
Acute severe on chronic symptomatic anemia Anemia panel pending at this time EKG ordered on admission EDP has ordered 2 units of PRBC pending transfusion at this time Timed hemoglobin/hematocrit for day of admission at 1800 ordered Goal hemoglobin greater than 8 Repeat CBC in the a.m. Admit to telemetry medical, observation

## 2022-05-05 NOTE — ED Notes (Signed)
Pt requesting constantly for snacks and ice cream. Pt already given afternoon meal tray and ice cream.

## 2022-05-06 LAB — BPAM RBC
Blood Product Expiration Date: 202405092359
Blood Product Expiration Date: 202405172359
Unit Type and Rh: 5100
Unit Type and Rh: 5100

## 2022-05-06 LAB — CBC
HCT: 24 % — ABNORMAL LOW (ref 39.0–52.0)
Hemoglobin: 7.9 g/dL — ABNORMAL LOW (ref 13.0–17.0)
MCH: 26 pg (ref 26.0–34.0)
MCHC: 32.9 g/dL (ref 30.0–36.0)
MCV: 78.9 fL — ABNORMAL LOW (ref 80.0–100.0)
Platelets: 425 10*3/uL — ABNORMAL HIGH (ref 150–400)
RBC: 3.04 MIL/uL — ABNORMAL LOW (ref 4.22–5.81)
RDW: 15.5 % (ref 11.5–15.5)
WBC: 7 10*3/uL (ref 4.0–10.5)
nRBC: 0 % (ref 0.0–0.2)

## 2022-05-06 LAB — TYPE AND SCREEN
ABO/RH(D): O POS
Unit division: 0
Unit division: 0

## 2022-05-06 LAB — BASIC METABOLIC PANEL
Anion gap: 5 (ref 5–15)
BUN: 11 mg/dL (ref 8–23)
CO2: 24 mmol/L (ref 22–32)
Calcium: 8.2 mg/dL — ABNORMAL LOW (ref 8.9–10.3)
Chloride: 97 mmol/L — ABNORMAL LOW (ref 98–111)
Creatinine, Ser: 0.94 mg/dL (ref 0.61–1.24)
GFR, Estimated: >60 mL/min (ref >60)
Glucose, Bld: 105 mg/dL — ABNORMAL HIGH (ref 70–99)
Potassium: 3.9 mmol/L (ref 3.5–5.1)
Sodium: 126 mmol/L — ABNORMAL LOW (ref 135–145)

## 2022-05-06 MED ORDER — DICLOFENAC SODIUM 1 % EX GEL
4.0000 g | Freq: Four times a day (QID) | CUTANEOUS | Status: DC
  Administered 2022-05-06 (×3): 4 g via TOPICAL
  Filled 2022-05-06: qty 100

## 2022-05-06 MED ORDER — KETOROLAC TROMETHAMINE 15 MG/ML IJ SOLN
15.0000 mg | Freq: Four times a day (QID) | INTRAMUSCULAR | Status: DC
  Filled 2022-05-06: qty 1

## 2022-05-06 MED ORDER — SODIUM CHLORIDE 0.9 % IV SOLN
300.0000 mg | Freq: Once | INTRAVENOUS | Status: AC
  Administered 2022-05-06: 300 mg via INTRAVENOUS
  Filled 2022-05-06: qty 300

## 2022-05-06 MED ORDER — NICOTINE 14 MG/24HR TD PT24
14.0000 mg | MEDICATED_PATCH | Freq: Every day | TRANSDERMAL | Status: DC
  Administered 2022-05-06: 14 mg via TRANSDERMAL
  Filled 2022-05-06: qty 1

## 2022-05-06 MED ORDER — FERROUS SULFATE 325 (65 FE) MG PO TABS
325.0000 mg | ORAL_TABLET | Freq: Every day | ORAL | Status: DC
  Administered 2022-05-06: 325 mg via ORAL
  Filled 2022-05-06: qty 1

## 2022-05-06 MED ORDER — ENSURE ENLIVE PO LIQD
237.0000 mL | Freq: Two times a day (BID) | ORAL | Status: DC
  Administered 2022-05-06: 237 mL via ORAL

## 2022-05-06 MED ORDER — IBUPROFEN 400 MG PO TABS
400.0000 mg | ORAL_TABLET | Freq: Four times a day (QID) | ORAL | Status: DC
  Administered 2022-05-06 (×3): 400 mg via ORAL
  Filled 2022-05-06 (×3): qty 1

## 2022-05-06 MED ORDER — METHOCARBAMOL 500 MG PO TABS
750.0000 mg | ORAL_TABLET | Freq: Three times a day (TID) | ORAL | Status: DC
  Administered 2022-05-06 (×3): 750 mg via ORAL
  Filled 2022-05-06 (×3): qty 2

## 2022-05-06 NOTE — Progress Notes (Addendum)
Initial Nutrition Assessment  DOCUMENTATION CODES:   Not applicable  INTERVENTION:  - Add Ensure Enlive po BID, each supplement provides 350 kcal and 20 grams of protein.  NUTRITION DIAGNOSIS:  No nutrition diagnosis at this time.   REASON FOR ASSESSMENT:   Malnutrition Screening Tool    ASSESSMENT:   67 y.o. male admits related to fall, right leg pain, and generalized weakness. PMH includes: alcohol use disorder, chronic pancreatitis, iron deficiency anemia. Pt is currently receiving medical management related to symptomatic anemia.  Meds include:  MVI, thiamine. Labs reviewed:  Na low, chloride low.   Pt was sleeping with blanket over face at time of assessment. Pt's family was at bedside to provide information. She reports that the pt was eating well PTA. No significant wt loss per record. RD will order supplements. Will continue to closely monitor PO intakes.   NUTRITION - FOCUSED PHYSICAL EXAM:  Unable to assess, pt asleep with blanket over head.   Diet Order:   Diet Order             Diet Heart Room service appropriate? Yes; Fluid consistency: Thin  Diet effective now                   EDUCATION NEEDS:      Skin:  Skin Assessment: Reviewed RN Assessment  Last BM:  05/05/22  Height:   Ht Readings from Last 1 Encounters:  05/05/22  (1.778 m)    Weight:   Wt Readings from Last 1 Encounters:  05/05/22 65.8 kg    Ideal Body Weight:     BMI:  Body mass index is 20.81 kg/m.  Estimated Nutritional Needs:   Kcal:  7829-5621 kcals  Protein:  80-100 gm  Fluid:  >/= 1.6 L  Bethann Humble, RD, LDN, CNSC.

## 2022-05-06 NOTE — Progress Notes (Signed)
PROGRESS NOTE    Wyatt Montgomery  ZOX:[id number] DOB: 04-26-1955 DOA: 05/05/2022 PCP: Pcp, No    Brief Narrative:  67 year old male with history of alcohol use disorder and dependence, chronic pancreatitis, with pancreatic head mass, iron deficiency anemia, history of portal vein thrombosis previously on Eliquis, tobacco/nicotine dependence, who presents to the emergency concerns of falling about 1 week ago and difficulty ambulating since then along with generalized fatigue and weakness.   He reports that he fell tripping in the bathroom about 2 weeks ago and hurt his right foot.  He was able to ambulate since then however has been having difficulty not ambulating like baseline.  He denies fever, chills, chest pain, shortness of breath, abdominal pain, dysuria, hematuria, diarrhea.   Denies black stools and or bloody bowel movements.  Reports that he used to take iron tablets however he stopped taking it because he ran out.  X-ray survey of right foot negative.  No fracture dislocation or soft tissue injury noted.  Adequate pedal pulses.  Foot normal color normal temperature.  Hemoglobin responded to blood transfusion.   Assessment & Plan:   Principal Problem:   Symptomatic anemia Active Problems:   Thrombosis, portal vein   Alcohol-induced chronic pancreatitis   Hyponatremia   Pancreatic mass   Iron deficiency anemia   Infestation by cimex   Tobacco dependence   Elevated alkaline phosphatase level   Right foot pain   Cocaine use  Symptomatic anemia Iron deficiency anemia Acute severe on chronic symptomatic anemia Received 2 units PRBC Hemoglobin responded appropriately Plan: No further transfusion at this time IV Venofer 300 mg x 1 P.o. iron supplementation to start tomorrow  Cocaine use He reports he lasted used it about one week ago Counseling given for cessation of crack cocaine use   Right foot pain At bedside, patient was able to flex and extend his right ankle,  and there was negative visual evidence of bleeding, pulses are intact bilaterally.  X-rays are negative Plan: Multimodal pain regimen PT evaluation Anticipate discharge 4/18  Elevated alkaline phosphatase level Appears chronic since 2022 Recommend outpatient follow-up regarding pancreatic mass, discussed with patient   Tobacco dependence Patient had nicotine patch ordered by EDP Nicotine patch as needed for nicotine craving ordered on admission   Infestation by cimex History of cimex infestation   Pancreatic mass Patient has not had outpatient workup Patient will need to be follow-up by outpatient PCP for further outpatient workup   Hyponatremia Asymptomatic Presumed hypervolemic in setting of daily beer use   Alcohol-induced chronic pancreatitis Patient reports his last drink was yesterday 05/04/2022, low likelihood of alcohol withdrawal at this time Ativan 2 mg IV every 4 hours as needed for anxiety, 24 hours ordered AM team to initiate alcohol CIWA precaution when appropriate   DVT prophylaxis: TED hose Code Status: Full Family Communication: Family member at bedside 4/17 Disposition Plan: Status is: Observation The patient will require care spanning > 2 midnights and should be moved to inpatient because: Ongoing pain control.  Monitor hemoglobin.  Anticipate discharge 4/18   Level of care: Telemetry Medical  Consultants:  None  Procedures:  None  Antimicrobials: None   Subjective: Seen and examined.  Resting in bed.  No visible distress.  Only complaint is pain in right foot.  Objective: Vitals:   05/05/22 2149 05/05/22 2333 05/06/22 0353 05/06/22 0742  BP: (!) 177/81 (!) 144/72 (!) 168/87 (!) 160/97  Pulse: 93 76 77 78  Resp: 18  Temp: 97.7 F (36.5 C) 98.7 F (37.1 C) 98.5 F (36.9 C) 98 F (36.7 C)  TempSrc: Oral     SpO2: 100% 100% 100% 100%  Weight:      Height:        Intake/Output Summary (Last 24 hours) at 05/06/2022 1253 Last  data filed at 05/06/2022 0742 Gross per 24 hour  Intake 373 ml  Output 700 ml  Net -327 ml   Filed Weights   05/05/22 1152  Weight: 65.8 kg    Examination:  General exam: Appears calm and comfortable  Respiratory system: Clear to auscultation. Respiratory effort normal. Cardiovascular system: S1-S2, RRR, no murmurs, no pedal edema Gastrointestinal system: Thin, soft, NT/ND, normal bowel sounds Central nervous system: Alert and oriented. No focal neurological deficits. Extremities: Symmetric 5 x 5 power. Skin: No rashes, lesions or ulcers Psychiatry: Judgement and insight appear normal. Mood & affect appropriate.     Data Reviewed: I have personally reviewed following labs and imaging studies  CBC: Recent Labs  Lab 05/05/22 1154 05/05/22 1717 05/05/22 1948 05/06/22 0452  WBC 6.0  --   --  7.0  NEUTROABS 3.8  --   --   --   HGB 5.7* 8.2* 7.8* 7.9*  HCT 18.3* 25.3* 24.4* 24.0*  MCV 79.9*  --   --  78.9*  PLT 456*  --   --  425*   Basic Metabolic Panel: Recent Labs  Lab 05/05/22 1154 05/06/22 0452  NA 121* 126*  K 4.0 3.9  CL 93* 97*  CO2 19* 24  GLUCOSE 114* 105*  BUN 8 11  CREATININE 0.93 0.94  CALCIUM 7.9* 8.2*  MG 1.9  --    GFR: Estimated Creatinine Clearance: 73.9 mL/min (by C-G formula based on SCr of 0.94 mg/dL). Liver Function Tests: Recent Labs  Lab 05/05/22 1154  AST 23  ALT 9  ALKPHOS 140*  BILITOT 0.5  PROT 5.8*  ALBUMIN 2.1*   No results for input(s): "LIPASE", "AMYLASE" in the last 168 hours. No results for input(s): "AMMONIA" in the last 168 hours. Coagulation Profile: No results for input(s): "INR", "PROTIME" in the last 168 hours. Cardiac Enzymes: No results for input(s): "CKTOTAL", "CKMB", "CKMBINDEX", "TROPONINI" in the last 168 hours. BNP (last 3 results) No results for input(s): "PROBNP" in the last 8760 hours. HbA1C: No results for input(s): "HGBA1C" in the last 72 hours. CBG: No results for input(s): "GLUCAP" in the  last 168 hours. Lipid Profile: No results for input(s): "CHOL", "HDL", "LDLCALC", "TRIG", "CHOLHDL", "LDLDIRECT" in the last 72 hours. Thyroid Function Tests: Recent Labs    05/05/22 1154  TSH 1.037  FREET4 0.91   Anemia Panel: Recent Labs    05/05/22 1154 05/05/22 1717  VITAMINB12  --  708  FOLATE 14.8  --   FERRITIN 18*  --   TIBC 241*  --   IRON 16*  --    Sepsis Labs: No results for input(s): "PROCALCITON", "LATICACIDVEN" in the last 168 hours.  No results found for this or any previous visit (from the past 240 hour(s)).       Radiology Studies: DG Foot 2 Views Right  Result Date: 05/05/2022 CLINICAL DATA:  Foot pain after fall. EXAM: RIGHT FOOT - 2 VIEW COMPARISON:  None Available. FINDINGS: There is no evidence of fracture or dislocation. Slight talonavicular spurring. No erosive change. Soft tissues are unremarkable. IMPRESSION: No fracture or dislocation of the right foot. Electronically Signed   By: Ivette Loyal.D.  On: 05/05/2022 20:05   DG Chest 2 View  Result Date: 05/05/2022 CLINICAL DATA:  Chest pain EXAM: CHEST - 2 VIEW COMPARISON:  CXR 05/29/19 FINDINGS: No pleural effusion. No pneumothorax. Normal cardiac and mediastinal contours. No focal airspace opacity. No radiographically apparent displaced rib fractures. Visualized upper abdomen is unremarkable. Vertebral body heights are maintained. IMPRESSION: No focal airspace opacity. Electronically Signed   By: Lorenza Cambridge M.D.   On: 05/05/2022 13:40        Scheduled Meds:  ketorolac  15 mg Intravenous Q6H   methocarbamol  750 mg Oral TID   multivitamin with minerals  1 tablet Oral QHS   nicotine  14 mg Transdermal Daily   thiamine  100 mg Oral Daily   Continuous Infusions:   LOS: 0 days     Tresa Moore, MD [facility]   If 7PM-7AM, please contact night-coverage  05/06/2022, 12:53 PM

## 2022-05-06 NOTE — Evaluation (Signed)
Physical Therapy Evaluation Patient Details Name: Wyatt Montgomery MRN: [id number] DOB: 1955-07-02 Today's Date: 05/06/2022  History of Present Illness  Pt is a 67 year old male with history of alcohol use disorder and dependence, chronic pancreatitis, with pancreatic head mass, iron deficiency anemia, history of portal vein thrombosis previously on Eliquis, tobacco/nicotine dependence, who presents to the emergency concerns of falling about 1 week ago and difficulty ambulating since then along with generalized fatigue and weakness.   Clinical Impression  Patient alert, agreeable to PT once woken. Pt reported at baseline he is independent and lives with his wife, no other recent falls. He was able to perform bed mobility with supervision and fair sitting balance sit<> stand and ambulation attempted twice, minA to come up into standing with RW and unable to ambulate further than 3 feet twice because of general unsteadiness and inability to straighten bilateral knees.  Overall the patient demonstrated deficits (see "PT Problem List") that impede the patient's functional abilities, safety, and mobility and would benefit from skilled PT intervention. Recommendation is to continued skilled PT intervention to maximize safety and function.      Recommendations for follow up therapy are one component of a multi-disciplinary discharge planning process, led by the attending physician.  Recommendations may be updated based on patient status, additional functional criteria and insurance authorization.  Follow Up Recommendations       Assistance Recommended at Discharge Frequent or constant Supervision/Assistance  Patient can return home with the following  A lot of help with walking and/or transfers;Assistance with feeding;Help with stairs or ramp for entrance;Assistance with cooking/housework;A lot of help with bathing/dressing/bathroom    Equipment Recommendations Rolling walker (2 wheels)   Recommendations for Other Services       Functional Status Assessment Patient has had a recent decline in their functional status and demonstrates the ability to make significant improvements in function in a reasonable and predictable amount of time.     Precautions / Restrictions Precautions Precautions: Fall Restrictions Weight Bearing Restrictions: No      Mobility  Bed Mobility Overal bed mobility: Needs Assistance Bed Mobility: Supine to Sit, Sit to Supine     Supine to sit: Supervision Sit to supine: Supervision        Transfers Overall transfer level: Needs assistance Equipment used: Rolling walker (2 wheels) Transfers: Sit to/from Stand Sit to Stand: Min assist                Ambulation/Gait Ambulation/Gait assistance: Min assist Gait Distance (Feet):  (35ft, twice) Assistive device: Rolling walker (2 wheels)         General Gait Details: generally unsteady, difficulty straightening bilatera knees  Stairs            Wheelchair Mobility    Modified Rankin (Stroke Patients Only)       Balance Overall balance assessment: Needs assistance Sitting-balance support: Feet supported Sitting balance-Leahy Scale: Good     Standing balance support: Reliant on assistive device for balance Standing balance-Leahy Scale: Poor                               Pertinent Vitals/Pain Pain Assessment Pain Assessment: Faces Faces Pain Scale: Hurts a little bit Pain Location: bottom of both feet Pain Descriptors / Indicators: Aching, Sore Pain Intervention(s): Limited activity within patient's tolerance, Monitored during session    Home Living Family/patient expects to be discharged to:: Private residence Living Arrangements: Spouse/significant other Available  Help at Discharge: Family Type of Home: House Home Access: Stairs to enter   Secretary/administrator of Steps: 2-3   Home Layout: One level Home Equipment: None      Prior  Function Prior Level of Function : Independent/Modified Independent                     Hand Dominance        Extremity/Trunk Assessment   Upper Extremity Assessment Upper Extremity Assessment: Generalized weakness    Lower Extremity Assessment Lower Extremity Assessment: Generalized weakness    Cervical / Trunk Assessment Cervical / Trunk Assessment: Normal  Communication   Communication: No difficulties  Cognition Arousal/Alertness: Awake/alert Behavior During Therapy: WFL for tasks assessed/performed Overall Cognitive Status: Within Functional Limits for tasks assessed                                          General Comments      Exercises     Assessment/Plan    PT Assessment Patient needs continued PT services  PT Problem List Decreased strength;Decreased mobility;Decreased activity tolerance;Decreased balance       PT Treatment Interventions DME instruction;Therapeutic activities;Gait training;Therapeutic exercise;Patient/family education;Stair training;Balance training;Functional mobility training;Neuromuscular re-education    PT Goals (Current goals can be found in the Care Plan section)  Acute Rehab PT Goals Patient Stated Goal: to feel better PT Goal Formulation: With patient Time For Goal Achievement: 05/20/22 Potential to Achieve Goals: Good    Frequency Min 3X/week     Co-evaluation               AM-PAC PT "6 Clicks" Mobility  Outcome Measure Help needed turning from your back to your side while in a flat bed without using bedrails?: None Help needed moving from lying on your back to sitting on the side of a flat bed without using bedrails?: None Help needed moving to and from a bed to a chair (including a wheelchair)?: None Help needed standing up from a chair using your arms (e.g., wheelchair or bedside chair)?: A Little Help needed to walk in hospital room?: A Lot Help needed climbing 3-5 steps with a  railing? : A Lot 6 Click Score: 19    End of Session Equipment Utilized During Treatment: Gait belt Activity Tolerance: Patient limited by fatigue Patient left: in bed;with call bell/phone within reach;with bed alarm set Nurse Communication: Mobility status PT Visit Diagnosis: Other abnormalities of gait and mobility (R26.89)    Time: 1425-1435 PT Time Calculation (min) (ACUTE ONLY): 10 min   Charges:   PT Evaluation $PT Eval Low Complexity: 1 Low         Olga Coaster PT, DPT 3:50 PM,05/06/22

## 2022-05-07 ENCOUNTER — Encounter: Payer: Self-pay | Admitting: Hematology and Oncology

## 2022-05-07 ENCOUNTER — Other Ambulatory Visit: Payer: Self-pay

## 2022-05-07 LAB — BASIC METABOLIC PANEL
Anion gap: 7 (ref 5–15)
BUN: 13 mg/dL (ref 8–23)
CO2: 22 mmol/L (ref 22–32)
Calcium: 8.3 mg/dL — ABNORMAL LOW (ref 8.9–10.3)
Chloride: 98 mmol/L (ref 98–111)
Creatinine, Ser: 1 mg/dL (ref 0.61–1.24)
GFR, Estimated: >60 mL/min (ref >60)
Glucose, Bld: 90 mg/dL (ref 70–99)
Potassium: 4.2 mmol/L (ref 3.5–5.1)
Sodium: 127 mmol/L — ABNORMAL LOW (ref 135–145)

## 2022-05-07 MED ORDER — METHOCARBAMOL 750 MG PO TABS
750.0000 mg | ORAL_TABLET | Freq: Three times a day (TID) | ORAL | 0 refills | Status: AC
  Filled 2022-05-07: qty 63, 21d supply, fill #0

## 2022-05-07 MED ORDER — FERROUS SULFATE 325 (65 FE) MG PO TABS
325.0000 mg | ORAL_TABLET | Freq: Every day | ORAL | 3 refills | Status: DC
  Filled 2022-05-07 (×2): qty 30, 30d supply, fill #0

## 2022-05-07 NOTE — Progress Notes (Signed)
Patient awaiting walker delivery. Will be going via taxi to [employer].

## 2022-05-07 NOTE — Discharge Instructions (Signed)
Some PCP options in [place] area- not a comprehensive list  [facility]- [phone] [place]- [phone] [facility]- [phone] [facility]- [phone] [facility]- [phone] [place]- [phone]  or [place] [facility] [phone]  

## 2022-05-07 NOTE — Discharge Summary (Signed)
Physician Discharge Summary  Wyatt Montgomery:[id number] DOB: 02-19-1955 DOA: 05/05/2022  PCP: Pcp, No  Admit date: 05/05/2022 Discharge date: 05/07/2022  Admitted From: Home Disposition:  Home  Recommendations for Outpatient Follow-up:  Follow up with PCP in 1-2 weeks   Home Health:No Equipment/Devices:RW Walker   Discharge Condition:Stable  CODE STATUS:FULL  Diet recommendation: Reg  Brief/Interim Summary: 67 year old male with history of alcohol use disorder and dependence, chronic pancreatitis, with pancreatic head mass, iron deficiency anemia, history of portal vein thrombosis previously on Eliquis, tobacco/nicotine dependence, who presents to the emergency concerns of falling about 1 week ago and difficulty ambulating since then along with generalized fatigue and weakness.    He reports that he fell tripping in the bathroom about 2 weeks ago and hurt his right foot.  He was able to ambulate since then however has been having difficulty not ambulating like baseline.  He denies fever, chills, chest pain, shortness of breath, abdominal pain, dysuria, hematuria, diarrhea.   Denies black stools and or bloody bowel movements.  Reports that he used to take iron tablets however he stopped taking it because he ran out.   X-ray survey of right foot negative.  No fracture dislocation or soft tissue injury noted.  Adequate pedal pulses.  Foot normal color normal temperature.  Hemoglobin responded to blood transfusion.  Hb stable at time of dc.  No evidence of bleeding.  Refilled script for [facility].  Added robaxin for right ankle pain.  RW walker provided.  Stable for discharge    Discharge Diagnoses:  Principal Problem:   Symptomatic anemia Active Problems:   Thrombosis, portal vein   Alcohol-induced chronic pancreatitis   Hyponatremia   Pancreatic mass   Iron deficiency anemia   Infestation by cimex   Tobacco dependence   Elevated alkaline phosphatase level   Right foot pain    Cocaine use  Symptomatic anemia Iron deficiency anemia Acute severe on chronic symptomatic anemia Received 2 units PRBC Hemoglobin responded appropriately Plan: No further transfusion at this time PO Feosol prescribed Stable to discharge   Cocaine use He reports he lasted used it about one week ago Counseling given for cessation of crack cocaine use   Right foot pain At bedside, patient was able to flex and extend his right ankle, and there was negative visual evidence of bleeding, pulses are intact bilaterally.  X-rays are negative Plan: Multimodal pain regimen PT evaluation TID robaxin RW walker  Discharge Instructions  Discharge Instructions     Diet - low sodium heart healthy   Complete by: As directed    Increase activity slowly   Complete by: As directed       Allergies as of 05/07/2022   No Known Allergies      Medication List     STOP taking these medications    furosemide 20 MG tablet Commonly known as: Lasix       TAKE these medications    acetaminophen 325 MG tablet Commonly known as: TYLENOL Take 650 mg by mouth every 6 (six) hours as needed.   FeroSul 325 (65 FE) MG tablet Generic drug: ferrous sulfate Take 1 tablet (325 mg total) by mouth daily.   methocarbamol 750 MG tablet Commonly known as: ROBAXIN Take 1 tablet (750 mg total) by mouth 3 (three) times daily for 21 days.               Durable Medical Equipment  (From admission, onward)  Start     Ordered   05/07/22 0855  For home use only DME Walker rolling  Once       Question Answer Comment  Walker: With 5 Inch Wheels   Patient needs a walker to treat with the following condition Weakness      05/07/22 0854            No Known Allergies  Consultations: None   Procedures/Studies: DG Foot 2 Views Right  Result Date: 05/05/2022 CLINICAL DATA:  Foot pain after fall. EXAM: RIGHT FOOT - 2 VIEW COMPARISON:  None Available. FINDINGS: There is no  evidence of fracture or dislocation. Slight talonavicular spurring. No erosive change. Soft tissues are unremarkable. IMPRESSION: No fracture or dislocation of the right foot. Electronically Signed   By: Narda Rutherford M.D.   On: 05/05/2022 20:05   DG Chest 2 View  Result Date: 05/05/2022 CLINICAL DATA:  Chest pain EXAM: CHEST - 2 VIEW COMPARISON:  CXR 05/29/19 FINDINGS: No pleural effusion. No pneumothorax. Normal cardiac and mediastinal contours. No focal airspace opacity. No radiographically apparent displaced rib fractures. Visualized upper abdomen is unremarkable. Vertebral body heights are maintained. IMPRESSION: No focal airspace opacity. Electronically Signed   By: Lorenza Cambridge M.D.   On: 05/05/2022 13:40      Subjective: Seen and examined on day of discharge.  Stable no distress.  Appropriate for discharge home.  Discharge Exam: Vitals:   05/07/22 0358 05/07/22 0810  BP: (!) 163/84 127/67  Pulse: 74 75  Resp: 20 17  Temp: 98.6 F (37 C) 97.6 F (36.4 C)  SpO2: 100% 100%   Vitals:   05/06/22 1620 05/06/22 2034 05/07/22 0358 05/07/22 0810  BP: (!) 160/83 (!) 167/88 (!) 163/84 127/67  Pulse: 82 80 74 75  Resp: Temp: 98.3 F (36.8 C) 98.4 F (36.9 C) 98.6 F (37 C) 97.6 F (36.4 C)  TempSrc:      SpO2: 100% 100% 100% 100%  Weight:      Height:        General: Pt is alert, awake, not in acute distress Cardiovascular: RRR, S1/S2 +, no rubs, no gallops Respiratory: CTA bilaterally, no wheezing, no rhonchi Abdominal: Soft, NT, ND, bowel sounds + Extremities: no edema, no cyanosis    The results of significant diagnostics from this hospitalization (including imaging, microbiology, ancillary and laboratory) are listed below for reference.     Microbiology: No results found for this or any previous visit (from the past 240 hour(s)).   Labs: BNP (last 3 results) No results for input(s): "BNP" in the last 8760 hours. Basic Metabolic Panel: Recent  Labs  Lab 05/05/22 1154 05/06/22 0452 05/07/22 0556  NA 121* 126* 127*  K 4.0 3.9 4.2  CL 93* 97* 98  CO2 19* 24 22  GLUCOSE 114* 105* 90  BUN CREATININE 0.93 0.94 1.00  CALCIUM 7.9* 8.2* 8.3*  MG 1.9  --   --    Liver Function Tests: Recent Labs  Lab 05/05/22 1154  AST 23  ALT 9  ALKPHOS 140*  BILITOT 0.5  PROT 5.8*  ALBUMIN 2.1*   No results for input(s): "LIPASE", "AMYLASE" in the last 168 hours. No results for input(s): "AMMONIA" in the last 168 hours. CBC: Recent Labs  Lab 05/05/22 1154 05/05/22 1717 05/05/22 1948 05/06/22 0452  WBC 6.0  --   --  7.0  NEUTROABS 3.8  --   --   --  HGB 5.7* 8.2* 7.8* 7.9*  HCT 18.3* 25.3* 24.4* 24.0*  MCV 79.9*  --   --  78.9*  PLT 456*  --   --  425*   Cardiac Enzymes: No results for input(s): "CKTOTAL", "CKMB", "CKMBINDEX", "TROPONINI" in the last 168 hours. BNP: Invalid input(s): "POCBNP" CBG: No results for input(s): "GLUCAP" in the last 168 hours. D-Dimer No results for input(s): "DDIMER" in the last 72 hours. Hgb A1c No results for input(s): "HGBA1C" in the last 72 hours. Lipid Profile No results for input(s): "CHOL", "HDL", "LDLCALC", "TRIG", "CHOLHDL", "LDLDIRECT" in the last 72 hours. Thyroid function studies Recent Labs    05/05/22 1154  TSH 1.037   Anemia work up Recent Labs    05/05/22 1154 05/05/22 1717  VITAMINB12  --  708  FOLATE 14.8  --   FERRITIN 18*  --   TIBC 241*  --   IRON 16*  --    Urinalysis    Component Value Date/Time   COLORURINE STRAW (A) 05/05/2022 1401   APPEARANCEUR CLEAR (A) 05/05/2022 1401   LABSPEC 1.005 05/05/2022 1401   PHURINE 6.0 05/05/2022 1401   GLUCOSEU NEGATIVE 05/05/2022 1401   HGBUR LARGE (A) 05/05/2022 1401   BILIRUBINUR NEGATIVE 05/05/2022 1401   KETONESUR NEGATIVE 05/05/2022 1401   PROTEINUR NEGATIVE 05/05/2022 1401   NITRITE NEGATIVE 05/05/2022 1401   LEUKOCYTESUR NEGATIVE 05/05/2022 1401   Sepsis Labs Recent Labs  Lab 05/05/22 1154  05/06/22 0452  WBC 6.0 7.0   Microbiology No results found for this or any previous visit (from the past 240 hour(s)).   Time coordinating discharge: Over 30 minutes  SIGNED:   Tresa Moore, MD  [facility] 05/07/2022, 12:56 PM Pager   If 7PM-7AM, please contact night-coverage

## 2022-05-07 NOTE — Progress Notes (Signed)
Patient discharged [facility] via w/c to taxi in stable condition.

## 2022-05-07 NOTE — Progress Notes (Signed)
Physical Therapy Treatment Patient Details Name: Wyatt Montgomery MRN: [id number] DOB: 1955/04/28 Today's Date: 05/07/2022   History of Present Illness Pt is a 66 year old male with history of alcohol use disorder and dependence, chronic pancreatitis, with pancreatic head mass, iron deficiency anemia, history of portal vein thrombosis previously on Eliquis, tobacco/nicotine dependence, who presents to the emergency concerns of falling about 1 week ago and difficulty ambulating since then along with generalized fatigue and weakness.    PT Comments    Patient wakes to voice and touch, agreeable to PT with motivation. Bed mobility modI, fair sitting balance including using standard commode with supervision and cues for grab bar. Sit <> stand with supervision and he ambulated a total of 72ft CGA-supervision, improved with time. Pt pleasantly surprised about decreased pain with ambulation. The patient would benefit from further skilled PT intervention to continue to progress towards goals.       Recommendations for follow up therapy are one component of a multi-disciplinary discharge planning process, led by the attending physician.  Recommendations may be updated based on patient status, additional functional criteria and insurance authorization.  Follow Up Recommendations       Assistance Recommended at Discharge Frequent or constant Supervision/Assistance  Patient can return home with the following Help with stairs or ramp for entrance;Assistance with cooking/housework;A little help with walking and/or transfers;A little help with bathing/dressing/bathroom   Equipment Recommendations  Rolling walker (2 wheels)    Recommendations for Other Services       Precautions / Restrictions Precautions Precautions: Fall Restrictions Weight Bearing Restrictions: No     Mobility  Bed Mobility Overal bed mobility: Modified Independent                  Transfers Overall transfer  level: Needs assistance Equipment used: Rolling walker (2 wheels) Transfers: Sit to/from Stand Sit to Stand: Min guard, Supervision           General transfer comment: cued for hand placement, progressed to supervision    Ambulation/Gait Ambulation/Gait assistance: Min guard Gait Distance (Feet): 30 Feet Assistive device: Rolling walker (2 wheels)         General Gait Details: trunk flexion, knee flexion, but no physical assistance needed   Stairs             Wheelchair Mobility    Modified Rankin (Stroke Patients Only)       Balance Overall balance assessment: Needs assistance Sitting-balance support: Feet supported Sitting balance-Leahy Scale: Good     Standing balance support: Bilateral upper extremity supported Standing balance-Leahy Scale: Fair Standing balance comment: but improved safety with BUE support                            Cognition Arousal/Alertness: Awake/alert Behavior During Therapy: WFL for tasks assessed/performed Overall Cognitive Status: Within Functional Limits for tasks assessed                                          Exercises      General Comments        Pertinent Vitals/Pain Pain Assessment Pain Assessment: Faces Faces Pain Scale: Hurts a little bit Pain Location: with mobility butbetter than yesterday per pt Pain Descriptors / Indicators: Grimacing, Moaning Pain Intervention(s): Limited activity within patient's tolerance, Monitored during session, Repositioned    Home Living  Prior Function            PT Goals (current goals can now be found in the care plan section) Progress towards PT goals: Progressing toward goals    Frequency    Min 3X/week      PT Plan Current plan remains appropriate    Co-evaluation              AM-PAC PT "6 Clicks" Mobility   Outcome Measure  Help needed turning from your back to your side while in  a flat bed without using bedrails?: None Help needed moving from lying on your back to sitting on the side of a flat bed without using bedrails?: None Help needed moving to and from a bed to a chair (including a wheelchair)?: None Help needed standing up from a chair using your arms (e.g., wheelchair or bedside chair)?: A Little Help needed to walk in hospital room?: A Little Help needed climbing 3-5 steps with a railing? : A Little 6 Click Score: 21    End of Session Equipment Utilized During Treatment: Gait belt Activity Tolerance: Patient tolerated treatment well Patient left: in bed;with call bell/phone within reach;with bed alarm set Nurse Communication: Mobility status PT Visit Diagnosis: Other abnormalities of gait and mobility (R26.89)     Time: 1610-9604 PT Time Calculation (min) (ACUTE ONLY): 10 min  Charges:  $Therapeutic Activity: 8-22 mins                     Olga Coaster PT, DPT 9:57 AM,05/07/22

## 2022-05-07 NOTE — Evaluation (Signed)
Occupational Therapy Evaluation Patient Details Name: Wyatt Montgomery MRN: [id number] DOB: 1955-03-23 Today's Date: 05/07/2022   History of Present Illness Pt is a 67 year old male with history of alcohol use disorder and dependence, chronic pancreatitis, with pancreatic head mass, iron deficiency anemia, history of portal vein thrombosis previously on Eliquis, tobacco/nicotine dependence, who presents to the emergency concerns of falling about 1 week ago and difficulty ambulating since then along with generalized fatigue and weakness.   Clinical Impression   [name] reports that he has been having pain "all over" since a fall ~ 2 weeks ago. He states that he normally rides a bicycle but has not been able to do so since this fall. He lost his apartment several months ago and has since been living in a motel. He has been Mod I in BADL, and during today's evaluation he is able to dress, transfer, perform grooming, toileting, self-feeding without physical assistance. He is somewhat unsteady, and very impulsive, during ambulation. Pt trials using a RW, which improves his stability; however, pt pushes the walker far out in front of him and picks it up to turn. He is unable to follow instructions to keep the walker closer to his body and to keep it on the ground. Provided educ re: improving diet to address anemia, poor nutrition, with pt verbalizing understanding although stating that he has little choice in what/when he eats. Checked in with pt re: drug/alcohol use and potential resources available to assist with harm reduction. Pt reports he does not feel his use of drugs or alcohol is problematic and has no interest at present in reducing use. He states the fall he experienced 2 weeks ago was not related to drug or alcohol use, and he denies other falls. Pt may benefit from use of a RW. No further OT services required; will sign off.   Recommendations for follow up therapy are one component of a  multi-disciplinary discharge planning process, led by the attending physician.  Recommendations may be updated based on patient status, additional functional criteria and insurance authorization.   Assistance Recommended at Discharge Intermittent Supervision/Assistance  Patient can return home with the following A little help with bathing/dressing/bathroom;Assist for transportation    Functional Status Assessment  Patient has had a recent decline in their functional status and demonstrates the ability to make significant improvements in function in a reasonable and predictable amount of time.  Equipment Recommendations  Other (comment) (RW)    Recommendations for Other Services       Precautions / Restrictions Precautions Precautions: Fall Restrictions Weight Bearing Restrictions: No      Mobility Bed Mobility Overal bed mobility: Modified Independent, Needs Assistance Bed Mobility: Supine to Sit, Sit to Supine     Supine to sit: Supervision Sit to supine: Supervision        Transfers Overall transfer level: Needs assistance Equipment used: Rolling walker (2 wheels) Transfers: Sit to/from Stand Sit to Stand: Supervision                  Balance Overall balance assessment: Needs assistance Sitting-balance support: Feet supported Sitting balance-Leahy Scale: Good     Standing balance support: Bilateral upper extremity supported Standing balance-Leahy Scale: Fair                             ADL either performed or assessed with clinical judgement   ADL Overall ADL's : Modified independent  General ADL Comments: Able to perform dressing, self-feeding, grooming, toilet transfers with Mod I     Vision         Perception     Praxis      Pertinent Vitals/Pain Pain Assessment Faces Pain Scale: Hurts a little bit Pain Location: "all over" Pain Intervention(s): Repositioned     Hand  Dominance     Extremity/Trunk Assessment Upper Extremity Assessment Upper Extremity Assessment: Generalized weakness   Lower Extremity Assessment Lower Extremity Assessment: Generalized weakness   Cervical / Trunk Assessment Cervical / Trunk Assessment: Normal   Communication Communication Communication: No difficulties   Cognition Arousal/Alertness: Awake/alert Behavior During Therapy: WFL for tasks assessed/performed, Anxious Overall Cognitive Status: Within Functional Limits for tasks assessed                                 General Comments: Pt fairly anxious, agitated. Implusive during ambulation. Unable to follow basic directions re: use of DME     General Comments       Exercises Other Exercises Other Exercises: Educ re: safe use of DME, drug/alcohol harm reduction, nutrition, social services availability in community   Shoulder Instructions      Home Living Family/patient expects to be discharged to:: Other (Comment) (living in a motel at present) Living Arrangements: Spouse/significant other Available Help at Discharge: Family Type of Home: Other(Comment) (motel room) Home Access: Level entry     Home Layout: One level     Bathroom Shower/Tub: Chief Strategy Officer: Standard     Home Equipment: None   Additional Comments: Pt and spouse evicted from their apt several months ago, currently living in a motel but will need to leave there soon and will be facing homelessness.      Prior Functioning/Environment Prior Level of Function : Independent/Modified Independent             Mobility Comments: ambulates w/o AD, rides a bicycle ADLs Comments: able to perform BADL INDly. Not employed, does not drive        OT Problem List: Decreased strength;Decreased activity tolerance;Impaired balance (sitting and/or standing);Pain      OT Treatment/Interventions:      OT Goals(Current goals can be found in the care plan section)  Acute Rehab OT Goals Patient Stated Goal: to have somewhere to live OT Goal Formulation: With patient Time For Goal Achievement: 05/21/22 Potential to Achieve Goals: Fair  OT Frequency:      Co-evaluation              AM-PAC OT "6 Clicks" Daily Activity     Outcome Measure Help from another person eating meals?: None Help from another person taking care of personal grooming?: None Help from another person toileting, which includes using toliet, bedpan, or urinal?: None Help from another person bathing (including washing, rinsing, drying)?: None Help from another person to put on and taking off regular upper body clothing?: None Help from another person to put on and taking off regular lower body clothing?: None 6 Click Score: 24   End of Session Equipment Utilized During Treatment: Rolling walker (2 wheels)  Activity Tolerance: Patient tolerated treatment well Patient left: in bed;with family/visitor present;with call bell/phone within reach  OT Visit Diagnosis: Unsteadiness on feet (R26.81);Muscle weakness (generalized) (M62.81)                Time: 5409-8119 OT Time Calculation (min): 16 min Charges:  OT General Charges $OT Visit: 1 Visit OT Evaluation $OT Eval Low Complexity: 1 Low OT Treatments $Self Care/Home Management : 8-22 mins Latina Craver, PhD, MS, OTR/L 05/07/22, 10:39 AM

## 2022-05-11 ENCOUNTER — Emergency Department: Payer: Self-pay

## 2022-05-11 ENCOUNTER — Encounter: Payer: Self-pay | Admitting: Emergency Medicine

## 2022-05-11 ENCOUNTER — Emergency Department
Admission: EM | Admit: 2022-05-11 | Discharge: 2022-05-11 | Disposition: A | Discharge status: Home / Self Care | Payer: Self-pay | Attending: Emergency Medicine | Admitting: Emergency Medicine

## 2022-05-11 ENCOUNTER — Other Ambulatory Visit: Payer: Self-pay

## 2022-05-11 DIAGNOSIS — E162 Hypoglycemia, unspecified: Secondary | ICD-10-CM | POA: Insufficient documentation

## 2022-05-11 DIAGNOSIS — R531 Weakness: Secondary | ICD-10-CM | POA: Insufficient documentation

## 2022-05-11 LAB — CBC
HCT: 27.1 % — ABNORMAL LOW (ref 39.0–52.0)
Hemoglobin: 8.6 g/dL — ABNORMAL LOW (ref 13.0–17.0)
MCH: 25.5 pg — ABNORMAL LOW (ref 26.0–34.0)
MCHC: 31.7 g/dL (ref 30.0–36.0)
MCV: 80.4 fL (ref 80.0–100.0)
Platelets: 490 10*3/uL — ABNORMAL HIGH (ref 150–400)
RBC: 3.37 MIL/uL — ABNORMAL LOW (ref 4.22–5.81)
RDW: 16.3 % — ABNORMAL HIGH (ref 11.5–15.5)
WBC: 6.3 10*3/uL (ref 4.0–10.5)
nRBC: 0 % (ref 0.0–0.2)

## 2022-05-11 LAB — BASIC METABOLIC PANEL
Anion gap: 8 (ref 5–15)
BUN: 11 mg/dL (ref 8–23)
CO2: 22 mmol/L (ref 22–32)
Calcium: 8.2 mg/dL — ABNORMAL LOW (ref 8.9–10.3)
Chloride: 98 mmol/L (ref 98–111)
Creatinine, Ser: 0.94 mg/dL (ref 0.61–1.24)
GFR, Estimated: >60 mL/min (ref >60)
Glucose, Bld: 79 mg/dL (ref 70–99)
Potassium: 4 mmol/L (ref 3.5–5.1)
Sodium: 128 mmol/L — ABNORMAL LOW (ref 135–145)

## 2022-05-11 LAB — CBG MONITORING, ED
Glucose-Capillary: 50 mg/dL — ABNORMAL LOW (ref 70–99)
Glucose-Capillary: 172 mg/dL — ABNORMAL HIGH (ref 70–99)

## 2022-05-11 LAB — TROPONIN I (HIGH SENSITIVITY): Troponin I (High Sensitivity): 3 ng/L (ref <18)

## 2022-05-11 MED ORDER — DEXTROSE 50 % IV SOLN
1.0000 | Freq: Once | INTRAVENOUS | Status: AC
  Administered 2022-05-11: 50 mL via INTRAVENOUS
  Filled 2022-05-11: qty 50

## 2022-05-11 MED ORDER — SODIUM CHLORIDE 0.9 % IV BOLUS
1000.0000 mL | Freq: Once | INTRAVENOUS | Status: AC
  Administered 2022-05-11: 1000 mL via INTRAVENOUS

## 2022-05-11 NOTE — ED Notes (Signed)
Patient transported to X-ray 

## 2022-05-11 NOTE — ED Triage Notes (Signed)
Patient to ED via [facility] from home for generalized weakness. States he was dc'd last week for anemia- received a blood transfusion and is not feeling any better. Patient asking for snacks. VS WNL with EMS.

## 2022-05-11 NOTE — Discharge Instructions (Signed)
You were seen in the emergency department for weakness.  Your hemoglobin level was not to a level that needed a blood transfusion.  It is importantly continue to follow-up with your hematologist to recheck your hemoglobin level.  Your glucose was low in the emergency department.  Improved with food.  It is important that you eat small frequent meals in order to keep your glucose up.  You are given information to follow-up with your primary care provider.  Return to the emergency department for any worsening symptoms.

## 2022-05-11 NOTE — ED Notes (Signed)
Patient transported to MRI 

## 2022-05-11 NOTE — ED Provider Notes (Signed)
[facility] Provider Note    Event Date/Time   First MD Initiated Contact with Patient 05/11/22 1344     (approximate)   History   Weakness   HPI  Wyatt Montgomery is a 67 y.o. male past medical history significant for alcohol use disorder, pancreatic head mass, iron deficiency anemia, presents to the emergency department with generalized weakness.  States that he has had generalized weakness and felt like he was going to pass out.  States that he was worried that his hemoglobin could be low since he needed a recent admission with a blood transfusion.  Endorses daily alcohol use.  Did not eat anything today.  States that he last ate some soup for dinner last night.  Last drink of alcohol was yesterday.  Denies any blood in his stool.     Physical Exam   Triage Vital Signs: ED Triage Vitals  Enc Vitals Group     BP 05/11/22 1151 (!) 163/90     Pulse Rate 05/11/22 1151 88     Resp 05/11/22 1151 18     Temp 05/11/22 1151 98.6 F (37 C)     Temp Source 05/11/22 1151 Oral     SpO2 05/11/22 1151 96 %     Weight --      Height --      Head Circumference --      Peak Flow --      Pain Score 05/11/22 1149 0     Pain Loc --      Pain Edu? --      Excl. in GC? --     Most recent vital signs: Vitals:   05/11/22 1151 05/11/22 1430  BP: (!) 163/90 (!) 169/71  Pulse: 88 85  Resp: 18 16  Temp: 98.6 F (37 C)   SpO2: 96% 98%    Physical Exam Constitutional:      Appearance: He is well-developed.  HENT:     Head: Atraumatic.  Eyes:     Conjunctiva/sclera: Conjunctivae normal.  Cardiovascular:     Rate and Rhythm: Regular rhythm.  Pulmonary:     Effort: No respiratory distress.  Musculoskeletal:     Cervical back: Normal range of motion.  Skin:    General: Skin is warm.  Neurological:     Mental Status: He is alert. Mental status is at baseline.     GCS: GCS eye subscore is 4. GCS verbal subscore is 5. GCS motor subscore is 6.     Cranial  Nerves: Cranial nerves 2-12 are intact.     Motor: Motor function is intact.     Coordination: Coordination is intact.     Gait: Gait is intact.     IMPRESSION / MDM / ASSESSMENT AND PLAN / ED COURSE  I reviewed the triage vital signs and the nursing notes.  Differential diagnosis including anemia, hypoglycemia, intracranial hemorrhage, pneumonia, dehydration, electrolyte abnormality  On chart review patient had a recent hospitalization where he received a blood transfusion for anemia and had a hemoglobin of 5 at that time.  Improved to greater than 7.  EKG  I, Corena Herter, the attending physician, personally viewed and interpreted this ECG.   Rate: Normal  Rhythm: Normal sinus  Axis: Normal  Intervals: Normal  ST&T Change: None  No tachycardic or bradycardic dysrhythmias while on cardiac telemetry.  RADIOLOGY I independently reviewed imaging, my interpretation of imaging: Chest x-ray with no signs of pneumonia.  Read as chronic interstitial  prominence.  Ordered CT scan of the head however patient refused.  Low suspicion for intracranial hemorrhage given that the patient is GCS of 15 and has a nonfocal neurologic exam.  LABS (all labs ordered are listed, but only abnormal results are displayed) Labs interpreted as -    Labs Reviewed  BASIC METABOLIC PANEL - Abnormal; Notable for the following components:      Result Value   Sodium 128 (*)    Calcium 8.2 (*)    All other components within normal limits  CBC - Abnormal; Notable for the following components:   RBC 3.37 (*)    Hemoglobin 8.6 (*)    HCT 27.1 (*)    MCH 25.5 (*)    RDW 16.3 (*)    Platelets 490 (*)    All other components within normal limits  CBG MONITORING, ED - Abnormal; Notable for the following components:   Glucose-Capillary 50 (*)    All other components within normal limits  CBG MONITORING, ED - Abnormal; Notable for the following components:   Glucose-Capillary 172 (*)    All other components  within normal limits  URINALYSIS, ROUTINE W REFLEX MICROSCOPIC  TYPE AND SCREEN  TROPONIN I (HIGH SENSITIVITY)  TROPONIN I (HIGH SENSITIVITY)     MDM  Patient with hypoglycemia on arrival with a glucose of 50.  Likely secondary to poor p.o. intake and some aspect of alcohol use disorder.  Given 1 amp of D50 and was given a meal tray.  Hemoglobin is stable at 8.6.  No signs of an infectious process.  Chronic hyponatremia likely in the setting of alcohol use disorder.  Platelets within normal limits.  Repeat glucose in the emergency department 170.  Patient states that he is feeling much better.  Ambulating in the emergency department without any difficulties.  Most likely with generalized weakness and fatigue secondary to poor p.o. intake and hypoglycemia.  Given information to follow-up with a primary care physician and put in for referral for PCP.  Discussed alcohol cessation.  Discussed return precautions and close follow-up with his hematologist.     PROCEDURES:  Critical Care performed: yes  .Critical Care  Performed by: Corena Herter, MD Authorized by: Corena Herter, MD   Critical care provider statement:    Critical care time (minutes):  30   Critical care was necessary to treat or prevent imminent or life-threatening deterioration of the following conditions:  Endocrine crisis   Critical care was time spent personally by me on the following activities:  Development of treatment plan with patient or surrogate, discussions with consultants, evaluation of patient's response to treatment, examination of patient, ordering and review of laboratory studies, ordering and review of radiographic studies, ordering and performing treatments and interventions, pulse oximetry, re-evaluation of patient's condition and review of old charts   Patient's presentation is most consistent with acute presentation with potential threat to life or bodily function.   MEDICATIONS ORDERED IN  ED: Medications  dextrose 50 % solution 50 mL (50 mLs Intravenous Given 05/11/22 1357)  sodium chloride 0.9 % bolus 1,000 mL (1,000 mLs Intravenous New Bag/Given 05/11/22 1432)    FINAL CLINICAL IMPRESSION(S) / ED DIAGNOSES   Final diagnoses:  Weakness  Hypoglycemia     Rx / DC Orders   ED Discharge Orders          Ordered    Ambulatory Referral to Primary Care (Establish Care)        05/11/22 1612  Note:  This document was prepared using Dragon voice recognition software and may include unintentional dictation errors.   Corena Herter, MD 05/11/22 1626

## 2022-05-27 ENCOUNTER — Emergency Department: Payer: Self-pay

## 2022-05-27 ENCOUNTER — Emergency Department
Admission: EM | Admit: 2022-05-27 | Discharge: 2022-05-27 | Disposition: A | Discharge status: Home / Self Care | Payer: Self-pay | Attending: Emergency Medicine | Admitting: Emergency Medicine

## 2022-05-27 ENCOUNTER — Other Ambulatory Visit: Payer: Self-pay

## 2022-05-27 ENCOUNTER — Encounter: Payer: Self-pay | Admitting: Emergency Medicine

## 2022-05-27 DIAGNOSIS — M79604 Pain in right leg: Secondary | ICD-10-CM | POA: Insufficient documentation

## 2022-05-27 DIAGNOSIS — Z720 Tobacco use: Secondary | ICD-10-CM | POA: Insufficient documentation

## 2022-05-27 DIAGNOSIS — M79605 Pain in left leg: Secondary | ICD-10-CM | POA: Insufficient documentation

## 2022-05-27 DIAGNOSIS — E162 Hypoglycemia, unspecified: Secondary | ICD-10-CM | POA: Insufficient documentation

## 2022-05-27 LAB — CBG MONITORING, ED
Glucose-Capillary: 50 mg/dL — ABNORMAL LOW (ref 70–99)
Glucose-Capillary: 128 mg/dL — ABNORMAL HIGH (ref 70–99)
Glucose-Capillary: 81 mg/dL (ref 70–99)

## 2022-05-27 NOTE — ED Notes (Signed)
Pt given 8 oz orange juice.  

## 2022-05-27 NOTE — ED Provider Notes (Signed)
[facility] Provider Note    Event Date/Time   First MD Initiated Contact with Patient 05/27/22 1519     (approximate)   History   Leg Pain   HPI  Wyatt Montgomery is a 67 y.o. male   Past medical history of alcohol induced chronic pancreatitis, iron deficiency anemia, tobacco use who presents to the emergency department with bilateral leg soreness.  He has been drinking alcohol today.  Denies trauma or falls recently.  He reports no chest pain shortness of breath, cough, fever or any other acute medical complaints. External Medical Documents Reviewed: Emergency department visit dated May 11, 2022 where came to the emergency department for generalized weakness and was found to have low blood sugar      Physical Exam   Triage Vital Signs: ED Triage Vitals  Enc Vitals Group     BP 05/27/22 1255 117/73     Pulse Rate 05/27/22 1255 68     Resp 05/27/22 1255 18     Temp 05/27/22 1255 97.8 F (36.6 C)     Temp Source 05/27/22 1255 Oral     SpO2 05/27/22 1255 100 %     Weight --      Height --      Head Circumference --      Peak Flow --      Pain Score 05/27/22 1256 8     Pain Loc --      Pain Edu? --      Excl. in GC? --     Most recent vital signs: Vitals:   05/27/22 1255  BP: 117/73  Pulse: 68  Resp: 18  Temp: 97.8 F (36.6 C)  SpO2: 100%    General: Awake, no distress.  CV:  Good peripheral perfusion.  Resp:  Normal effort.  Abd:  No distention. Other:  Awake alert comfortable.  Moving lower extremities with full active range of motion neurovascular intact no signs of bony tenderness deformity or injury.  No tenderness to palpation of the thighs or calves.  Mild swelling to the calf, right slightly greater than left   ED Results / Procedures / Treatments   Labs (all labs ordered are listed, but only abnormal results are displayed) Labs Reviewed  CBG MONITORING, ED - Abnormal; Notable for the following components:       Result Value   Glucose-Capillary 50 (*)    All other components within normal limits  CBG MONITORING, ED     I ordered and reviewed the above labs they are notable for blood sugar is low at 50.  PROCEDURES:  Critical Care performed: No  Procedures   MEDICATIONS ORDERED IN ED: Medications - No data to display  IMPRESSION / MDM / ASSESSMENT AND PLAN / ED COURSE  I reviewed the triage vital signs and the nursing notes.                                Patient's presentation is most consistent with acute presentation with potential threat to life or bodily function.  Differential diagnosis includes, but is not limited to, fracture or dislocation, DVT, rhabdo   The patient is on the cardiac monitor to evaluate for evidence of arrhythmia and/or significant heart rate changes.  MDM: This patient who admits to alcohol use today and is in the emergency department complaining of bilateral lower extremity pain without trauma and has a fairly benign  exam except for some slight unilateral swelling.  Obtain DVT scan to rule out DVT.  I doubt rhabdomyolysis given no tenderness to the muscles or fracture dislocation given no trauma and full range of motion neurovascular intact doubt ischemia.  His blood glucose is 50 and it was low in the past thought to be due to poor p.o. intake, will feed and recheck  During his hospital stay, he makes note of some rectal bleeding that he states he sees spots of blood on the toilet paper when he wipes and this is been going on for the last several days.  Doubt significant blood loss given this report of spotting blood and normal hemodynamics.   DVT scan is negative.  Will continue to feed and recheck CBG if normalizing will discharge.      FINAL CLINICAL IMPRESSION(S) / ED DIAGNOSES   Final diagnoses:  Bilateral leg pain  Hypoglycemia     Rx / DC Orders   ED Discharge Orders     None        Note:  This document was prepared using Dragon  voice recognition software and may include unintentional dictation errors.    Pilar Jarvis, MD 05/27/22 [phone]

## 2022-05-27 NOTE — ED Notes (Signed)
Pt returned from US

## 2022-05-27 NOTE — ED Notes (Signed)
Pt given sandwich tray and sprite.  

## 2022-05-27 NOTE — ED Notes (Signed)
Pt taken to US

## 2022-05-27 NOTE — ED Notes (Addendum)
First Nurse Note: Patient to ED via [facility] from the [facility]. Patient c/o bilateral leg pain and now unable to walk. Patient states he has drank "a few today". Patient able to move both legs. No injury.  EMS VS: 129/70 75 HR 100% RA 97.7

## 2022-05-27 NOTE — ED Notes (Signed)
Pt also stating he has been having "spots of blood in my stool." Pt states its been about 3 days since this started.

## 2022-05-27 NOTE — Discharge Instructions (Signed)
Make sure you eat food regularly to make sure your blood sugar does not drop too low.  See your doctor for a follow-up visit.   If you have any new, worsening or unexpected symptoms call your doctor right away return to the emergency department for recheck.

## 2022-09-17 ENCOUNTER — Inpatient Hospital Stay
Admission: EM | Admit: 2022-09-17 | Discharge: 2022-09-21 | DRG: 291 | Disposition: A | Discharge status: Home / Self Care | Payer: Self-pay | Attending: Internal Medicine | Admitting: Internal Medicine

## 2022-09-17 ENCOUNTER — Emergency Department: Payer: Self-pay

## 2022-09-17 ENCOUNTER — Other Ambulatory Visit: Payer: Self-pay

## 2022-09-17 DIAGNOSIS — D509 Iron deficiency anemia, unspecified: Secondary | ICD-10-CM | POA: Diagnosis present

## 2022-09-17 DIAGNOSIS — I2489 Other forms of acute ischemic heart disease: Secondary | ICD-10-CM | POA: Diagnosis present

## 2022-09-17 DIAGNOSIS — F1010 Alcohol abuse, uncomplicated: Secondary | ICD-10-CM | POA: Diagnosis present

## 2022-09-17 DIAGNOSIS — F17210 Nicotine dependence, cigarettes, uncomplicated: Secondary | ICD-10-CM | POA: Diagnosis present

## 2022-09-17 DIAGNOSIS — F1410 Cocaine abuse, uncomplicated: Secondary | ICD-10-CM | POA: Diagnosis present

## 2022-09-17 DIAGNOSIS — M7989 Other specified soft tissue disorders: Secondary | ICD-10-CM

## 2022-09-17 DIAGNOSIS — E440 Moderate protein-calorie malnutrition: Secondary | ICD-10-CM | POA: Insufficient documentation

## 2022-09-17 DIAGNOSIS — E876 Hypokalemia: Secondary | ICD-10-CM | POA: Diagnosis present

## 2022-09-17 DIAGNOSIS — I81 Portal vein thrombosis: Secondary | ICD-10-CM | POA: Diagnosis present

## 2022-09-17 DIAGNOSIS — Z79899 Other long term (current) drug therapy: Secondary | ICD-10-CM

## 2022-09-17 DIAGNOSIS — I509 Heart failure, unspecified: Secondary | ICD-10-CM

## 2022-09-17 DIAGNOSIS — Z8249 Family history of ischemic heart disease and other diseases of the circulatory system: Secondary | ICD-10-CM

## 2022-09-17 DIAGNOSIS — F1910 Other psychoactive substance abuse, uncomplicated: Secondary | ICD-10-CM

## 2022-09-17 DIAGNOSIS — I10 Essential (primary) hypertension: Secondary | ICD-10-CM

## 2022-09-17 DIAGNOSIS — I5031 Acute diastolic (congestive) heart failure: Secondary | ICD-10-CM | POA: Diagnosis present

## 2022-09-17 DIAGNOSIS — E871 Hypo-osmolality and hyponatremia: Secondary | ICD-10-CM | POA: Diagnosis present

## 2022-09-17 DIAGNOSIS — Z1152 Encounter for screening for COVID-19: Secondary | ICD-10-CM

## 2022-09-17 DIAGNOSIS — I110 Hypertensive heart disease with heart failure: Principal | ICD-10-CM | POA: Diagnosis present

## 2022-09-17 DIAGNOSIS — Z8673 Personal history of transient ischemic attack (TIA), and cerebral infarction without residual deficits: Secondary | ICD-10-CM

## 2022-09-17 DIAGNOSIS — Z681 Body mass index (BMI) 19 or less, adult: Secondary | ICD-10-CM

## 2022-09-17 DIAGNOSIS — I161 Hypertensive emergency: Secondary | ICD-10-CM | POA: Diagnosis present

## 2022-09-17 LAB — RETICULOCYTES
Immature Retic Fract: 30 % — ABNORMAL HIGH (ref 2.3–15.9)
RBC.: 3.94 MIL/uL — ABNORMAL LOW (ref 4.22–5.81)
Retic Count, Absolute: 83.9 10*3/uL (ref 19.0–186.0)
Retic Ct Pct: 2.1 % (ref 0.4–3.1)

## 2022-09-17 LAB — RESP PANEL BY RT-PCR (RSV, FLU A&B, COVID)  RVPGX2
Influenza A by PCR: NEGATIVE
Influenza B by PCR: NEGATIVE
Resp Syncytial Virus by PCR: NEGATIVE
SARS Coronavirus 2 by RT PCR: NEGATIVE

## 2022-09-17 LAB — HEPATIC FUNCTION PANEL
ALT: 44 U/L (ref 0–44)
AST: 105 U/L — ABNORMAL HIGH (ref 15–41)
Albumin: 3.2 g/dL — ABNORMAL LOW (ref 3.5–5.0)
Alkaline Phosphatase: 153 U/L — ABNORMAL HIGH (ref 38–126)
Bilirubin, Direct: <0.1 mg/dL (ref 0.0–0.2)
Total Bilirubin: 0.4 mg/dL (ref 0.3–1.2)
Total Protein: 7.4 g/dL (ref 6.5–8.1)

## 2022-09-17 LAB — CBC
HCT: 25 % — ABNORMAL LOW (ref 39.0–52.0)
Hemoglobin: 7.8 g/dL — ABNORMAL LOW (ref 13.0–17.0)
MCH: 21.7 pg — ABNORMAL LOW (ref 26.0–34.0)
MCHC: 31.2 g/dL (ref 30.0–36.0)
MCV: 69.6 fL — ABNORMAL LOW (ref 80.0–100.0)
Platelets: 415 10*3/uL — ABNORMAL HIGH (ref 150–400)
RBC: 3.59 MIL/uL — ABNORMAL LOW (ref 4.22–5.81)
RDW: 16.3 % — ABNORMAL HIGH (ref 11.5–15.5)
WBC: 5 10*3/uL (ref 4.0–10.5)
nRBC: 0 % (ref 0.0–0.2)

## 2022-09-17 LAB — BASIC METABOLIC PANEL
Anion gap: 8 (ref 5–15)
Anion gap: 7 (ref 5–15)
BUN: 13 mg/dL (ref 8–23)
BUN: 13 mg/dL (ref 8–23)
CO2: 23 mmol/L (ref 22–32)
CO2: 22 mmol/L (ref 22–32)
Calcium: 9 mg/dL (ref 8.9–10.3)
Calcium: 8.7 mg/dL — ABNORMAL LOW (ref 8.9–10.3)
Chloride: 89 mmol/L — ABNORMAL LOW (ref 98–111)
Chloride: 91 mmol/L — ABNORMAL LOW (ref 98–111)
Creatinine, Ser: 1.12 mg/dL (ref 0.61–1.24)
Creatinine, Ser: 1.21 mg/dL (ref 0.61–1.24)
GFR, Estimated: >60 mL/min (ref >60)
GFR, Estimated: >60 mL/min (ref >60)
Glucose, Bld: 99 mg/dL (ref 70–99)
Glucose, Bld: 122 mg/dL — ABNORMAL HIGH (ref 70–99)
Potassium: 3.4 mmol/L — ABNORMAL LOW (ref 3.5–5.1)
Potassium: 3.3 mmol/L — ABNORMAL LOW (ref 3.5–5.1)
Sodium: 120 mmol/L — ABNORMAL LOW (ref 135–145)
Sodium: 120 mmol/L — ABNORMAL LOW (ref 135–145)

## 2022-09-17 LAB — TSH: TSH: 3.157 u[IU]/mL (ref 0.350–4.500)

## 2022-09-17 LAB — BRAIN NATRIURETIC PEPTIDE: B Natriuretic Peptide: 530.6 pg/mL — ABNORMAL HIGH (ref 0.0–100.0)

## 2022-09-17 LAB — TROPONIN I (HIGH SENSITIVITY)
Troponin I (High Sensitivity): 24 ng/L — ABNORMAL HIGH (ref <18)
Troponin I (High Sensitivity): 20 ng/L — ABNORMAL HIGH (ref <18)

## 2022-09-17 MED ORDER — HYDROCOD POLI-CHLORPHE POLI ER 10-8 MG/5ML PO SUER
5.0000 mL | Freq: Two times a day (BID) | ORAL | Status: DC | PRN
  Administered 2022-09-20: 5 mL via ORAL
  Filled 2022-09-17: qty 5

## 2022-09-17 MED ORDER — LORAZEPAM 2 MG/ML IJ SOLN: 1.0000 mg | INTRAMUSCULAR | Status: AC | PRN

## 2022-09-17 MED ORDER — FOLIC ACID 1 MG PO TABS
1.0000 mg | ORAL_TABLET | Freq: Once | ORAL | Status: AC
  Administered 2022-09-17: 1 mg via ORAL
  Filled 2022-09-17: qty 1

## 2022-09-17 MED ORDER — FUROSEMIDE 10 MG/ML IJ SOLN
40.0000 mg | Freq: Once | INTRAMUSCULAR | Status: AC
  Administered 2022-09-17: 40 mg via INTRAVENOUS
  Filled 2022-09-17: qty 4

## 2022-09-17 MED ORDER — POTASSIUM CHLORIDE CRYS ER 20 MEQ PO TBCR
40.0000 meq | EXTENDED_RELEASE_TABLET | Freq: Once | ORAL | Status: AC
  Administered 2022-09-17: 40 meq via ORAL
  Filled 2022-09-17: qty 2

## 2022-09-17 MED ORDER — SODIUM CHLORIDE 0.9% FLUSH: 3.0000 mL | INTRAVENOUS | Status: DC | PRN

## 2022-09-17 MED ORDER — FOLIC ACID 1 MG PO TABS
1.0000 mg | ORAL_TABLET | Freq: Every day | ORAL | Status: DC
  Administered 2022-09-18 – 2022-09-21 (×4): 1 mg via ORAL
  Filled 2022-09-17 (×4): qty 1

## 2022-09-17 MED ORDER — THIAMINE HCL 100 MG/ML IJ SOLN
100.0000 mg | Freq: Every day | INTRAMUSCULAR | Status: DC
  Filled 2022-09-17: qty 2

## 2022-09-17 MED ORDER — THIAMINE HCL 100 MG/ML IJ SOLN
100.0000 mg | Freq: Once | INTRAMUSCULAR | Status: AC
  Administered 2022-09-17: 100 mg via INTRAVENOUS
  Filled 2022-09-17: qty 2

## 2022-09-17 MED ORDER — ACETAMINOPHEN 325 MG PO TABS
650.0000 mg | ORAL_TABLET | ORAL | Status: DC | PRN
  Administered 2022-09-18 – 2022-09-20 (×2): 650 mg via ORAL
  Filled 2022-09-17 (×2): qty 2

## 2022-09-17 MED ORDER — SODIUM CHLORIDE 0.9% FLUSH
3.0000 mL | Freq: Two times a day (BID) | INTRAVENOUS | Status: DC
  Administered 2022-09-18 – 2022-09-21 (×8): 3 mL via INTRAVENOUS

## 2022-09-17 MED ORDER — ADULT MULTIVITAMIN W/MINERALS CH
1.0000 | ORAL_TABLET | Freq: Every day | ORAL | Status: DC
  Administered 2022-09-17 – 2022-09-21 (×5): 1 via ORAL
  Filled 2022-09-17 (×5): qty 1

## 2022-09-17 MED ORDER — SODIUM CHLORIDE 0.9 % IV SOLN: 250.0000 mL | INTRAVENOUS | Status: DC | PRN

## 2022-09-17 MED ORDER — FUROSEMIDE 10 MG/ML IJ SOLN
40.0000 mg | Freq: Every day | INTRAMUSCULAR | Status: DC
  Administered 2022-09-18 – 2022-09-19 (×2): 40 mg via INTRAVENOUS
  Filled 2022-09-17 (×2): qty 4

## 2022-09-17 MED ORDER — LORAZEPAM 1 MG PO TABS: 1.0000 mg | ORAL_TABLET | ORAL | Status: AC | PRN

## 2022-09-17 MED ORDER — HYDRALAZINE HCL 20 MG/ML IJ SOLN
5.0000 mg | Freq: Three times a day (TID) | INTRAMUSCULAR | Status: DC | PRN
  Administered 2022-09-19: 5 mg via INTRAVENOUS
  Filled 2022-09-17: qty 1

## 2022-09-17 MED ORDER — ONDANSETRON HCL 4 MG/2ML IJ SOLN
4.0000 mg | Freq: Four times a day (QID) | INTRAMUSCULAR | Status: DC | PRN
  Filled 2022-09-17: qty 2

## 2022-09-17 MED ORDER — THIAMINE MONONITRATE 100 MG PO TABS
100.0000 mg | ORAL_TABLET | Freq: Every day | ORAL | Status: DC
  Administered 2022-09-18 – 2022-09-21 (×4): 100 mg via ORAL
  Filled 2022-09-17 (×4): qty 1

## 2022-09-17 MED ORDER — ENOXAPARIN SODIUM 40 MG/0.4ML IJ SOSY
40.0000 mg | PREFILLED_SYRINGE | INTRAMUSCULAR | Status: DC
  Administered 2022-09-17 – 2022-09-20 (×4): 40 mg via SUBCUTANEOUS
  Filled 2022-09-17 (×4): qty 0.4

## 2022-09-17 MED ORDER — SODIUM CHLORIDE 0.9 % IV BOLUS
500.0000 mL | Freq: Once | INTRAVENOUS | Status: AC
  Administered 2022-09-17: 500 mL via INTRAVENOUS

## 2022-09-17 NOTE — Plan of Care (Signed)

## 2022-09-17 NOTE — Assessment & Plan Note (Signed)
Patient declines knowing any previous history of hypertension.  While here, he is been significantly hypertensive as high as 180/90.  He is not on any medications at home.  - Monitor while admitted.  Hopefully blood pressure will come down with diuresis - Consider addition of ARB prior to discharge - Hydralazine as needed for SBP over 180

## 2022-09-17 NOTE — Assessment & Plan Note (Signed)
Patient is presenting with nonspecific chest pain and shortness of breath, found to be hypervolemic with elevated BNP at 530.  Given polysubstance abuse and uncontrolled hypertension, and high risk for congestive heart disease.  - Telemetry monitoring - Echocardiogram - IV Lasix 40 mg daily - Strict in and out - Daily weight

## 2022-09-17 NOTE — Assessment & Plan Note (Signed)
Patient has a history of alcohol use disorder and cocaine use disorder.  At high risk for alcohol withdrawal  - CIWA with as needed Ativan - If initial CIWA scoring is elevated, can consider phenobarbital taper - Daily multivitamin, folic acid and thiamine - TOC consultation for resources

## 2022-09-17 NOTE — ED Provider Notes (Signed)
[facility] Provider Note    Event Date/Time   First MD Initiated Contact with Patient 09/17/22 1040     (approximate)   History   Chest Pain   HPI Wyatt Montgomery is a 67 y.o. male presenting today for bilateral leg swelling.  Patient has worsening leg swelling over the past week associated with intermittent shortness of breath and vague chest pain.  Otherwise denying fever, chills, nausea, vomiting, abdominal pain.  Patient reports prior history of similar leg swelling.  Denies any daily medications.  Denies any prior heart history.  Does admit to smoking half a pack per day as well as approximately 4 beers per day.     Physical Exam   Triage Vital Signs: ED Triage Vitals  Encounter Vitals Group     BP 09/17/22 1033 (!) 180/90     Systolic BP Percentile --      Diastolic BP Percentile --      Pulse Rate 09/17/22 1033 75     Resp 09/17/22 1033 19     Temp 09/17/22 1033 97.6 F (36.4 C)     Temp src --      SpO2 09/17/22 1033 100 %     Weight --      Height --      Head Circumference --      Peak Flow --      Pain Score 09/17/22 1032 6     Pain Loc --      Pain Education --      Exclude from Growth Chart --     Most recent vital signs: Vitals:   09/18/22 0531 09/18/22 0811  BP: (!) 167/83 (!) 177/89  Pulse: 87 77  Resp:  20  Temp: 98.7 F (37.1 C) 98.6 F (37 C)  SpO2: 99% 100%   Physical Exam: I have reviewed the vital signs and nursing notes. General: Awake, alert, no acute distress.  Nontoxic appearing. Head:  Atraumatic, normocephalic.   ENT:  EOM intact, PERRL. Oral mucosa is pink and moist with no lesions. Neck: Neck is supple with full range of motion, No meningeal signs. Cardiovascular:  RRR, No murmurs. Peripheral pulses palpable and equal bilaterally. Respiratory:  Symmetrical chest wall expansion.  Scattered crackles throughout bilateral lung bases.  Good air movement throughout.  No use of accessory muscles.    Musculoskeletal:  No cyanosis.  Bilateral leg swelling present with 1+ pitting edema.  No obvious warmth or erythema noted.  Moving extremities with full ROM Abdomen:  Soft, nontender, nondistended. Neuro:  GCS 15, moving all four extremities, interacting appropriately. Speech clear. Psych:  Calm, appropriate.   Skin:  Warm, dry, no rash.     ED Results / Procedures / Treatments   Labs (all labs ordered are listed, but only abnormal results are displayed) Labs Reviewed  BASIC METABOLIC PANEL - Abnormal; Notable for the following components:      Result Value   Sodium 120 (*)    Potassium 3.4 (*)    Chloride 89 (*)    All other components within normal limits  CBC - Abnormal; Notable for the following components:   RBC 3.59 (*)    Hemoglobin 7.8 (*)    HCT 25.0 (*)    MCV 69.6 (*)    MCH 21.7 (*)    RDW 16.3 (*)    Platelets 415 (*)    All other components within normal limits  BRAIN NATRIURETIC PEPTIDE - Abnormal; Notable for the  following components:   B Natriuretic Peptide 530.6 (*)    All other components within normal limits  HEPATIC FUNCTION PANEL - Abnormal; Notable for the following components:   Albumin 3.2 (*)    AST 105 (*)    Alkaline Phosphatase 153 (*)    All other components within normal limits  RETICULOCYTES - Abnormal; Notable for the following components:   RBC. 3.94 (*)    Immature Retic Fract 30.0 (*)    All other components within normal limits  BASIC METABOLIC PANEL - Abnormal; Notable for the following components:   Sodium 120 (*)    Potassium 3.3 (*)    Chloride 91 (*)    Glucose, Bld 122 (*)    Calcium 8.7 (*)    All other components within normal limits  BASIC METABOLIC PANEL - Abnormal; Notable for the following components:   Sodium 127 (*)    Chloride 95 (*)    Glucose, Bld 109 (*)    All other components within normal limits  RETICULOCYTES - Abnormal; Notable for the following components:   RBC. 3.42 (*)    Immature Retic Fract 22.2  (*)    All other components within normal limits  FERRITIN - Abnormal; Notable for the following components:   Ferritin 12 (*)    All other components within normal limits  IRON AND TIBC - Abnormal; Notable for the following components:   Iron 13 (*)    Saturation Ratios 3 (*)    All other components within normal limits  TROPONIN I (HIGH SENSITIVITY) - Abnormal; Notable for the following components:   Troponin I (High Sensitivity) 24 (*)    All other components within normal limits  TROPONIN I (HIGH SENSITIVITY) - Abnormal; Notable for the following components:   Troponin I (High Sensitivity) 20 (*)    All other components within normal limits  RESP PANEL BY RT-PCR (RSV, FLU A&B, COVID)  RVPGX2  TSH  MAGNESIUM  PHOSPHORUS  HIV ANTIBODY (ROUTINE TESTING W REFLEX)  BASIC METABOLIC PANEL     EKG My interpretation: Rate 69, normal sinus rhythm, normal axis. No acute ST elevation or depression   RADIOLOGY My independent interpretation: Mild pulmonary edema present.   PROCEDURES:  Critical Care performed: No  Procedures   MEDICATIONS ORDERED IN ED: Medications  sodium chloride flush (NS) 0.9 % injection 3 mL (3 mLs Intravenous Given 09/18/22 0853)  sodium chloride flush (NS) 0.9 % injection 3 mL (has no administration in time range)  0.9 %  sodium chloride infusion (has no administration in time range)  acetaminophen (TYLENOL) tablet 650 mg (has no administration in time range)  ondansetron (ZOFRAN) injection 4 mg (has no administration in time range)  furosemide (LASIX) injection 40 mg (40 mg Intravenous Given 09/18/22 0853)  chlorpheniramine-HYDROcodone (TUSSIONEX) 10-8 MG/5ML suspension 5 mL (has no administration in time range)  LORazepam (ATIVAN) tablet 1-4 mg (has no administration in time range)    Or  LORazepam (ATIVAN) injection 1-4 mg (has no administration in time range)  thiamine (VITAMIN B1) tablet 100 mg (100 mg Oral Given 09/18/22 0852)    Or  thiamine  (VITAMIN B1) injection 100 mg ( Intravenous See Alternative 09/18/22 0852)  folic acid (FOLVITE) tablet 1 mg (1 mg Oral Given 09/18/22 0852)  multivitamin with minerals tablet 1 tablet (1 tablet Oral Given 09/18/22 0852)  enoxaparin (LOVENOX) injection 40 mg (40 mg Subcutaneous Given 09/17/22 1848)  hydrALAZINE (APRESOLINE) injection 5 mg (has no administration in time range)  guaiFENesin-dextromethorphan (ROBITUSSIN DM) 100-10 MG/5ML syrup 5 mL (5 mLs Oral Given 09/18/22 1004)  furosemide (LASIX) injection 40 mg (40 mg Intravenous Given 09/17/22 1206)  thiamine (VITAMIN B1) injection 100 mg (100 mg Intravenous Given 09/17/22 1259)  folic acid (FOLVITE) tablet 1 mg (1 mg Oral Given 09/17/22 1302)  sodium chloride 0.9 % bolus 500 mL (0 mLs Intravenous Stopped 09/17/22 1356)  potassium chloride SA (KLOR-CON M) CR tablet 40 mEq (40 mEq Oral Given 09/17/22 1848)     IMPRESSION / MDM / ASSESSMENT AND PLAN / ED COURSE  I reviewed the triage vital signs and the nursing notes.                              Differential diagnosis includes, but is not limited to, CHF exacerbation, covid, flu, dehydration, DVT.  Patient's presentation is most consistent with acute illness / injury with system symptoms.  Patient is a 67 year old male presenting today for leg swelling and shortness of breath. Pitting edema present and CXR showing pulmonary edema along with elevated BNP suggesting CHF exacerbation. Incidentally also found to be hyponatremic to 120. Likely secondary to chronic alcohol abuse. Patient given thiamine, folate, and lasix. Patient admitted to hospitalist service for further care of CHF exacerbation and hyponatremia.  The patient is on the cardiac monitor to evaluate for evidence of arrhythmia and/or significant heart rate changes. Clinical Course as of 09/18/22 1023  Thu Sep 17, 2022  1141 Troponin I (High Sensitivity)(!): 24 [DW]  1141 Basic metabolic panel(!) Hyponatremia at 120.  Creatinine  otherwise normal.  Patient does note daily alcohol use which could be contributing to this hyponatremia. [DW]  1141 CBC(!) Chronic anemia consistent with baseline [DW]  1152 B Natriuretic Peptide(!): 530.6 [DW]  1152 DG Chest 2 View Mild pulmonary edema present per my interpretation [DW]  1312 Admitted to hospitalist [DW]    Clinical Course User Index [DW] Janith Lima, MD     FINAL CLINICAL IMPRESSION(S) / ED DIAGNOSES   Final diagnoses:  Hyponatremia  Leg swelling     Rx / DC Orders   ED Discharge Orders     None        Note:  This document was prepared using Dragon voice recognition software and may include unintentional dictation errors.   Janith Lima, MD 09/18/22 1026

## 2022-09-17 NOTE — Assessment & Plan Note (Addendum)
No longer on Eliquis 

## 2022-09-17 NOTE — Assessment & Plan Note (Signed)
Patient has a history of severe iron deficiency anemia, previously requiring transfusions.  At this time, hemoglobin is above 7.  Last iron panel 4 months ago with iron saturation of 7%.  Likely multifactorial in the setting of poor nutrition due to alcohol use disorder  - Daily iron supplementation - Transfuse for hemoglobin less than 7 - Reticulocyte count

## 2022-09-17 NOTE — Consult Note (Signed)
PHARMACY CONSULT NOTE - ELECTROLYTES  Pharmacy Consult for Electrolyte Monitoring and Replacement   Recent Labs: Weight: 63.5 kg (140 lb) Estimated Creatinine Clearance: 59.8 mL/min (by C-G formula based on SCr of 1.12 mg/dL). Potassium (mmol/L)  Date Value  09/17/2022 3.4 (L)   Magnesium (mg/dL)  Date Value  64/40/3474 1.9   Calcium (mg/dL)  Date Value  25/95/6387 9.0   Albumin (g/dL)  Date Value  56/43/3295 2.1 (L)   Phosphorus (mg/dL)  Date Value  18/84/1660 3.7   Sodium (mmol/L)  Date Value  09/17/2022 120 (L)   Corrected Ca: 10.52 mg/dL  Assessment  KAHMANI PACIFIC is a 67 y.o. male presenting with chest pain. PMH significant for alcohol use disorder, alcohol-induced chronic pancreatitis, pancreatic cyst, chronic microcytic anemia, chronic thrombocytosis, portal vein thrombosis(on apixaban). Pharmacy has been consulted to monitor and replace electrolytes.  Diet: regular Pertinent medications: furosemide 40mg  IV daily  Goal of Therapy: Electrolytes WNL  Plan:  KCL PO x 1 Check BMP, Mg, Phos with AM labs  Thank you for allowing pharmacy to be a part of this patient's care.  Bettey Costa, PharmD Clinical Pharmacist 09/17/2022 2:41 PM

## 2022-09-17 NOTE — ED Triage Notes (Signed)
Pt comes with c/o cp for two days. Pt states swelling in bilateral ankles. Pt denies any CHF hx. Pt states pain all across chest.

## 2022-09-17 NOTE — Assessment & Plan Note (Signed)
Patient has a history of intermittent hyponatremia, with sodium currently 120.  Likely multifactorial in the setting of daily high quantity beer use and hypervolemia due to heart failure.  I suspect sodium will improve with diuresis.  - Diuresis as noted above - BMP every 8 hours

## 2022-09-17 NOTE — H&P (Addendum)
History and Physical    Patient: Wyatt Montgomery WGN:[id number] DOB: 17-Nov-1955 DOA: 09/17/2022 DOS: the patient was seen and examined on 09/17/2022 PCP: Pcp, No  Patient coming from: Home  Chief Complaint:  Chief Complaint  Patient presents with   Chest Pain   HPI: [name] is a 67 y.o. male with medical history significant of alcohol use disorder, alcohol induced chronic pancreatitis, pancreatitic cyst, chronic microcytic anemia, chronic thrombocytosis, portal vein thrombosis on Eliquis, who presents to the ED due to chest pain.  Mr. Wyatt Montgomery states that approximately 1 and half weeks ago, he noticed lower extremity swelling that has gradually worsened.  Due to the heaviness of his legs, he has had a few falls but denies hitting his head.  Then, he has slowly developed a cough in addition to shortness of breath.  He occasionally has chest pain but states that it is fleeting.  He endorses 1 episode of nausea and vomiting last night but states it was due to drinking liquor.  He notes that his wife was recently hospitalized for CVA and it has been causing him a lot of stress.  He denies any palpitations, orthopnea or PND.  [name] states that he uses cocaine approximately once a week with the last use approximately 4 days ago.  He has several beers per day.  ED Course:  On arrival to the ED, patient was hypertensive at 180/90 with heart rate of 72.  He was saturating at 100% on room air.  He was afebrile at 97.6.  Initial workup notable for hemoglobin of 7.8, MCV of 69, platelets of 415, sodium of 120, chloride of 89, bicarb 23, creatinine 1.12 with GFR above 60.  BNP elevated at 530 and troponin elevated 24.  COVID-19, influenza and RSV PCR negative.  Chest x-ray with vascular congestion.  Bilateral lower extremity Dopplers negative.  Patient started on IV Lasix and TRH contacted for admission.   Review of Systems: As mentioned in the history of present illness. All other systems  reviewed and are negative.  Past Medical History:  Diagnosis Date   Pancreatic mass    Thrombosis, portal vein    Past Surgical History:  Procedure Laterality Date   ABDOMINAL SURGERY     EUS N/A 08/10/2019   Procedure: FULL UPPER ENDOSCOPIC ULTRASOUND (EUS) RADIAL;  Surgeon: Rayann Heman, MD;  Location: [facility] ENDOSCOPY;  Service: Endoscopy;  Laterality: N/A;   Social History:  reports that he has been smoking cigarettes. He has a 10 pack-year smoking history. He has never used smokeless tobacco. He reports current alcohol use of about 28.0 standard drinks of alcohol per week. He reports current drug use. Drug: "Crack" cocaine.  No Known Allergies  Family History  Problem Relation Age of Onset   Hypertension Father     Prior to Admission medications   Medication Sig Start Date End Date Taking? Authorizing Provider  acetaminophen (TYLENOL) 325 MG tablet Take 650 mg by mouth every 6 (six) hours as needed.    [provider]  ferrous sulfate 325 (65 FE) MG tablet Take 1 tablet (325 mg total) by mouth daily. 05/07/22   Wyatt Moore, MD    Physical Exam: Vitals:   09/17/22 1033 09/17/22 1111 09/17/22 1305 09/17/22 1528  BP: (!) 180/90 (!) 168/89  (!) 173/98  Pulse: 75   73  Resp: 19 15  20   Temp: 97.6 F (36.4 C)   98.6 F (37 C)  TempSrc:    Oral  SpO2: 100% 100%  100%  Weight:   63.5 kg    Physical Exam Vitals and nursing note reviewed.  Constitutional:      General: He is not in acute distress.    Appearance: He is normal weight. He is not toxic-appearing.  HENT:     Head: Normocephalic and atraumatic.     Mouth/Throat:     Mouth: Mucous membranes are moist.     Pharynx: Oropharynx is clear.  Eyes:     Extraocular Movements: Extraocular movements intact.     Pupils: Pupils are equal, round, and reactive to light.  Cardiovascular:     Rate and Rhythm: Normal rate and regular rhythm.     Heart sounds: Heart sounds not distant.  Pulmonary:     Effort:  Pulmonary effort is normal. No tachypnea or respiratory distress.     Breath sounds: Examination of the right-lower field reveals decreased breath sounds. Examination of the left-lower field reveals decreased breath sounds. Decreased breath sounds present. No wheezing, rhonchi or rales.  Abdominal:     General: Bowel sounds are normal. There is no distension.     Palpations: Abdomen is soft.     Tenderness: There is no abdominal tenderness. There is no guarding.  Musculoskeletal:     Right lower leg: 2+ Pitting Edema present.     Left lower leg: 2+ Pitting Edema present.  Skin:    General: Skin is warm and dry.  Neurological:     General: No focal deficit present.     Mental Status: He is alert and oriented to person, place, and time.  Psychiatric:        Mood and Affect: Mood normal.        Behavior: Behavior normal.    Data Reviewed: CBC with WBC of 5.0, hemoglobin 7.8, MCV of 69, platelets of 450 BMP with sodium of 120, potassium 3.4, bicarb 23, glucose 99, BUN 13, creatinine 1.12 with GFR above 60 BNP 530 Troponin 24 COVID-19, influenza and RSV PCR negative  EKG personally reviewed.  Sinus rhythm with rate of 74.  J-point elevation and changes consistent with LVH.  Otherwise no acute changes suggestive of acute ischemia.  US Venous Img Lower Bilateral  Result Date: 09/17/2022 CLINICAL DATA:  Bilateral lower extremity pain and edema for the past week, left-greater-than-right. History of smoking. Evaluate for DVT. EXAM: BILATERAL LOWER EXTREMITY VENOUS DOPPLER ULTRASOUND TECHNIQUE: Gray-scale sonography with graded compression, as well as color Doppler and duplex ultrasound were performed to evaluate the lower extremity deep venous systems from the level of the common femoral vein and including the common femoral, femoral, profunda femoral, popliteal and calf veins including the posterior tibial, peroneal and gastrocnemius veins when visible. The superficial great saphenous vein was  also interrogated. Spectral Doppler was utilized to evaluate flow at rest and with distal augmentation maneuvers in the common femoral, femoral and popliteal veins. COMPARISON:  Bilateral lower extremity venous Doppler ultrasound-05/27/2022 (negative); left lower extremity venous Doppler ultrasound-03/29/2020 (negative) FINDINGS: RIGHT LOWER EXTREMITY Common Femoral Vein: No evidence of thrombus. Normal compressibility, respiratory phasicity and response to augmentation. Saphenofemoral Junction: No evidence of thrombus. Normal compressibility and flow on color Doppler imaging. Profunda Femoral Vein: No evidence of thrombus. Normal compressibility and flow on color Doppler imaging. Femoral Vein: No evidence of thrombus. Normal compressibility, respiratory phasicity and response to augmentation. Popliteal Vein: No evidence of thrombus. Normal compressibility, respiratory phasicity and response to augmentation. Calf Veins: No evidence of thrombus. Normal compressibility and flow on  color Doppler imaging. Superficial Great Saphenous Vein: No evidence of thrombus. Normal compressibility. Other Findings:  None. LEFT LOWER EXTREMITY Common Femoral Vein: No evidence of thrombus. Normal compressibility, respiratory phasicity and response to augmentation. Saphenofemoral Junction: No evidence of thrombus. Normal compressibility and flow on color Doppler imaging. Profunda Femoral Vein: No evidence of thrombus. Normal compressibility and flow on color Doppler imaging. Femoral Vein: No evidence of thrombus. Normal compressibility, respiratory phasicity and response to augmentation. Popliteal Vein: No evidence of thrombus. Normal compressibility, respiratory phasicity and response to augmentation. Calf Veins: No evidence of thrombus. Normal compressibility and flow on color Doppler imaging. Superficial Great Saphenous Vein: No evidence of thrombus. Normal compressibility. Other Findings: There is a minimal to moderate amount of  subcutaneous edema at the level of the left lower leg and calf. IMPRESSION: No evidence of DVT within either lower extremity. Electronically Signed   By: Simonne Come M.D.   On: 09/17/2022 12:04   DG Chest 2 View  Result Date: 09/17/2022 CLINICAL DATA:  Chest pain with bilateral ankle swelling for 2 days. EXAM: CHEST - 2 VIEW COMPARISON:  Radiographs 05/11/2022 and 05/05/2022. FINDINGS: The heart size is stable at the upper limits of normal. The mediastinal contours are normal. Interval increased interstitial and visual thickening suspicious for mild pulmonary edema. No confluent airspace disease, pleural effusion or pneumothorax. The bones appear unremarkable. Telemetry leads overlie the chest. IMPRESSION: Borderline heart size with interval increased interstitial and vascular thickening suspicious for mild pulmonary edema. Electronically Signed   By: Carey Bullocks M.D.   On: 09/17/2022 11:46    Results are pending, will review when available.  Assessment and Plan:  * Acute heart failure (HCC) Patient is presenting with nonspecific chest pain and shortness of breath, found to be hypervolemic with elevated BNP at 530.  Given polysubstance abuse and uncontrolled hypertension, and high risk for congestive heart disease.  - Telemetry monitoring - Echocardiogram - IV Lasix 40 mg daily - Strict in and out - Daily weight  Hyponatremia Patient has a history of intermittent hyponatremia, with sodium currently 120.  Likely multifactorial in the setting of daily high quantity beer use and hypervolemia due to heart failure.  I suspect sodium will improve with diuresis.  - Diuresis as noted above - BMP every 8 hours  Polysubstance abuse (HCC) Patient has a history of alcohol use disorder and cocaine use disorder.  At high risk for alcohol withdrawal  - CIWA with as needed Ativan - If initial CIWA scoring is elevated, can consider phenobarbital taper - Daily multivitamin, folic acid and thiamine -  TOC consultation for resources  Essential hypertension Patient declines knowing any previous history of hypertension.  While here, he is been significantly hypertensive as high as 180/90.  He is not on any medications at home.  - Monitor while admitted.  Hopefully blood pressure will come down with diuresis - Consider addition of ARB prior to discharge - Hydralazine as needed for SBP over 180  Iron deficiency anemia Patient has a history of severe iron deficiency anemia, previously requiring transfusions.  At this time, hemoglobin is above 7.  Last iron panel 4 months ago with iron saturation of 7%.  Likely multifactorial in the setting of poor nutrition due to alcohol use disorder  - Daily iron supplementation - Transfuse for hemoglobin less than 7 - Reticulocyte count  Thrombosis, portal vein No longer on Eliquis  Advance Care Planning:   Code Status: Full Code Verified by patient  Consults:none  Family  Communication: No family at bedside  Severity of Illness: The appropriate patient status for this patient is OBSERVATION. Observation status is judged to be reasonable and necessary in order to provide the required intensity of service to ensure the patient's safety. The patient's presenting symptoms, physical exam findings, and initial radiographic and laboratory data in the context of their medical condition is felt to place them at decreased risk for further clinical deterioration. Furthermore, it is anticipated that the patient will be medically stable for discharge from the hospital within 2 midnights of admission.   Author: Verdene Lennert, MD 09/17/2022 4:20 PM  For on call review [email or web address].

## 2022-09-18 ENCOUNTER — Inpatient Hospital Stay (HOSPITAL_COMMUNITY)
Admit: 2022-09-18 | Discharge: 2022-09-18 | Disposition: A | Discharge status: Home / Self Care | Payer: Self-pay | Attending: Internal Medicine | Admitting: Internal Medicine

## 2022-09-18 ENCOUNTER — Encounter: Payer: Self-pay | Admitting: Internal Medicine

## 2022-09-18 ENCOUNTER — Other Ambulatory Visit (HOSPITAL_COMMUNITY): Payer: Self-pay

## 2022-09-18 DIAGNOSIS — I5021 Acute systolic (congestive) heart failure: Secondary | ICD-10-CM

## 2022-09-18 DIAGNOSIS — E876 Hypokalemia: Secondary | ICD-10-CM | POA: Diagnosis present

## 2022-09-18 LAB — ECHOCARDIOGRAM COMPLETE
AR max vel: 2.06 cm2
AV Area VTI: 2.32 cm2
AV Area mean vel: 1.9 cm2
AV Mean grad: 9 mmHg
AV Peak grad: 13.7 mmHg
Ao pk vel: 1.85 m/s
Area-P 1/2: 6.27 cm2
MV VTI: 2.64 cm2
S' Lateral: 3.3 cm
Weight: 2240 [oz_av]

## 2022-09-18 LAB — BASIC METABOLIC PANEL
Anion gap: 9 (ref 5–15)
BUN: 12 mg/dL (ref 8–23)
CO2: 23 mmol/L (ref 22–32)
Calcium: 9.2 mg/dL (ref 8.9–10.3)
Chloride: 95 mmol/L — ABNORMAL LOW (ref 98–111)
Creatinine, Ser: 1.12 mg/dL (ref 0.61–1.24)
GFR, Estimated: >60 mL/min (ref >60)
Glucose, Bld: 109 mg/dL — ABNORMAL HIGH (ref 70–99)
Potassium: 3.8 mmol/L (ref 3.5–5.1)
Sodium: 127 mmol/L — ABNORMAL LOW (ref 135–145)

## 2022-09-18 LAB — RETICULOCYTES
Immature Retic Fract: 22.2 % — ABNORMAL HIGH (ref 2.3–15.9)
RBC.: 3.42 MIL/uL — ABNORMAL LOW (ref 4.22–5.81)
Retic Count, Absolute: 74.6 10*3/uL (ref 19.0–186.0)
Retic Ct Pct: 2.2 % (ref 0.4–3.1)

## 2022-09-18 LAB — PHOSPHORUS: Phosphorus: 3.5 mg/dL (ref 2.5–4.6)

## 2022-09-18 LAB — IRON AND TIBC
Iron: 13 ug/dL — ABNORMAL LOW (ref 45–182)
Saturation Ratios: 3 % — ABNORMAL LOW (ref 17.9–39.5)
TIBC: 384 ug/dL (ref 250–450)
UIBC: 371 ug/dL

## 2022-09-18 LAB — MAGNESIUM: Magnesium: 2.2 mg/dL (ref 1.7–2.4)

## 2022-09-18 LAB — FERRITIN: Ferritin: 12 ng/mL — ABNORMAL LOW (ref 24–336)

## 2022-09-18 LAB — HIV ANTIBODY (ROUTINE TESTING W REFLEX): HIV Screen 4th Generation wRfx: NONREACTIVE

## 2022-09-18 MED ORDER — GUAIFENESIN-DM 100-10 MG/5ML PO SYRP
5.0000 mL | ORAL_SOLUTION | Freq: Four times a day (QID) | ORAL | Status: DC | PRN
  Administered 2022-09-18 – 2022-09-20 (×4): 5 mL via ORAL
  Filled 2022-09-18 (×4): qty 10

## 2022-09-18 MED ORDER — ENSURE ENLIVE PO LIQD
237.0000 mL | Freq: Two times a day (BID) | ORAL | Status: DC
  Administered 2022-09-19 – 2022-09-20 (×3): 237 mL via ORAL

## 2022-09-18 MED ORDER — LOSARTAN POTASSIUM 25 MG PO TABS
25.0000 mg | ORAL_TABLET | Freq: Every day | ORAL | Status: DC
  Administered 2022-09-18 – 2022-09-19 (×2): 25 mg via ORAL
  Filled 2022-09-18 (×2): qty 1

## 2022-09-18 MED ORDER — SODIUM CHLORIDE 0.9 % IV SOLN
200.0000 mg | Freq: Once | INTRAVENOUS | Status: AC
  Administered 2022-09-18: 200 mg via INTRAVENOUS
  Filled 2022-09-18: qty 200

## 2022-09-18 MED ORDER — POTASSIUM CHLORIDE CRYS ER 20 MEQ PO TBCR
40.0000 meq | EXTENDED_RELEASE_TABLET | Freq: Once | ORAL | Status: AC
  Administered 2022-09-18: 40 meq via ORAL
  Filled 2022-09-18: qty 2

## 2022-09-18 NOTE — Progress Notes (Addendum)
Progress Note    Wyatt Montgomery  ZHY:[id number] DOB: 1955-07-08  DOA: 09/17/2022 PCP: Pcp, No      Brief Narrative:    Medical records reviewed and are as summarized below:  Wyatt Montgomery is a 67 y.o. male  with medical history significant of alcohol use disorder, alcohol induced chronic pancreatitis, pancreatitic cyst, chronic microcytic anemia, chronic thrombocytosis, portal vein thrombosis on Eliquis, who presented to the hospital because of chest pain, cough, shortness of breath and lower extremity swelling.  He has a hide nausea and vomiting the night prior to admission but he attributed this to drinking liquor.  He was admitted to the hospital for acute diastolic heart failure and hypertensive emergency.    Assessment/Plan:   Principal Problem:   Acute heart failure (HCC) Active Problems:   Hyponatremia   Polysubstance abuse (HCC)   Essential hypertension   Thrombosis, portal vein   Iron deficiency anemia   Hypokalemia   Body mass index is 19.9 kg/m.    Acute diastolic heart failure: Continue IV Lasix.  Monitor BMP, daily weight and urine output.   BNP 530.6. 2D echo is pending.   Chest pain: Troponins only marginally elevated to 24 and 28.  This is likely from demand ischemia.   Hypertensive emergency: BP is better.  Start losartan.   Hyponatremia: Slowly improving.  Monitor BMP.   Hypokalemia: Replete potassium and monitor levels   Chronic Iron deficiency anemia: Give 1 dose of IV iron sucrose. Iron 13, ferritin 12, saturation ratio 3,  TIBC 384.   S/p fall at home: No skilled PT follow-up   Polysubstance use disorder (alcohol and cocaine): counseled to quit using alcohol      Diet Order             Diet regular Room service appropriate? Yes; Fluid consistency: Thin; Fluid restriction: 2000 mL Fluid  Diet effective now                            Consultants:   None  Procedures:  none    Medications:     enoxaparin (LOVENOX) injection  40 mg Subcutaneous Q24H   folic acid  1 mg Oral Daily   furosemide  40 mg Intravenous Daily   losartan  25 mg Oral Daily   multivitamin with minerals  1 tablet Oral Daily   potassium chloride  40 mEq Oral Once   sodium chloride flush  3 mL Intravenous Q12H   thiamine  100 mg Oral Daily   Or   thiamine  100 mg Intravenous Daily   Continuous Infusions:  sodium chloride     iron sucrose       Anti-infectives (From admission, onward)    None              Family Communication/Anticipated D/C date and plan/Code Status   DVT prophylaxis: enoxaparin (LOVENOX) injection 40 mg Start: 09/17/22 1800     Code Status: Full Code  Family Communication: None Disposition Plan:   Plan to discharge home   Status is: Inpatient Remains inpatient appropriate because:  CHF       Subjective:   He complains ofof shortness of breath.  Chest pain is better  Objective:    Vitals:   09/18/22 0531 09/18/22 0704 09/18/22 0811 09/18/22 1129  BP: (!) 167/83  (!) 177/89 (!) 163/88  Pulse: 87  77 78  Resp:   20 20  Temp: 98.7 F (37.1 C)  98.6 F (37 C) 98.4 F (36.9 C)  TempSrc: Oral  Oral Oral  SpO2: 99%  100% 100%  Weight:  62.9 kg     No data found.   Intake/Output Summary (Last 24 hours) at 09/18/2022 1328 Last data filed at 09/18/2022 1129 Gross per 24 hour  Intake 1710 ml  Output 2775 ml  Net -1065 ml   Filed Weights   09/17/22 1305 09/17/22 1528 09/18/22 0704  Weight: 63.5 kg 62.9 kg 62.9 kg    Exam:  GEN: NAD SKIN: Warm and dry EYES: No pallor or icterus ENT: MMM CV: RRR PULM: CTA B ABD: soft, ND, NT, +BS CNS: AAO x 3, non focal EXT: B/l leg edema, no erythema or tenderness       Data Reviewed:   I have personally reviewed following labs and imaging studies:  Labs: Labs show the following:   Basic Metabolic Panel: Recent Labs  Lab 09/17/22 1048 09/17/22 1838 09/18/22 0322  NA 120* 120* 127*  K  3.4* 3.3* 3.8  CL 89* 91* 95*  CO2 23 22 23   GLUCOSE 99 122* 109*  BUN 13 13 12   CREATININE 1.12 1.21 1.12  CALCIUM 9.0 8.7* 9.2  MG  --   --  2.2  PHOS  --   --  3.5   GFR Estimated Creatinine Clearance: 59.3 mL/min (by C-G formula based on SCr of 1.12 mg/dL). Liver Function Tests: Recent Labs  Lab 09/17/22 1306  AST 105*  ALT 44  ALKPHOS 153*  BILITOT 0.4  PROT 7.4  ALBUMIN 3.2*   No results for input(s): "LIPASE", "AMYLASE" in the last 168 hours. No results for input(s): "AMMONIA" in the last 168 hours. Coagulation profile No results for input(s): "INR", "PROTIME" in the last 168 hours.  CBC: Recent Labs  Lab 09/17/22 1048  WBC 5.0  HGB 7.8*  HCT 25.0*  MCV 69.6*  PLT 415*   Cardiac Enzymes: No results for input(s): "CKTOTAL", "CKMB", "CKMBINDEX", "TROPONINI" in the last 168 hours. BNP (last 3 results) No results for input(s): "PROBNP" in the last 8760 hours. CBG: No results for input(s): "GLUCAP" in the last 168 hours. D-Dimer: No results for input(s): "DDIMER" in the last 72 hours. Hgb A1c: No results for input(s): "HGBA1C" in the last 72 hours. Lipid Profile: No results for input(s): "CHOL", "HDL", "LDLCALC", "TRIG", "CHOLHDL", "LDLDIRECT" in the last 72 hours. Thyroid function studies: Recent Labs    09/17/22 1521  TSH 3.157   Anemia work up: Recent Labs    09/17/22 1521 09/18/22 0322  FERRITIN  --  12*  TIBC  --  384  IRON  --  13*  RETICCTPCT 2.1 2.2   Sepsis Labs: Recent Labs  Lab 09/17/22 1048  WBC 5.0    Microbiology Recent Results (from the past 240 hour(s))  Resp panel by RT-PCR (RSV, Flu A&B, Covid) Anterior Nasal Swab     Status: None   Collection Time: 09/17/22 11:10 AM   Specimen: Anterior Nasal Swab  Result Value Ref Range Status   SARS Coronavirus 2 by RT PCR NEGATIVE NEGATIVE Final    Comment: (NOTE) SARS-CoV-2 target nucleic acids are NOT DETECTED.  The SARS-CoV-2 RNA is generally detectable in upper  respiratory specimens during the acute phase of infection. The lowest concentration of SARS-CoV-2 viral copies this assay can detect is 138 copies/mL. A negative result does not preclude SARS-Cov-2 infection and should not be used as the sole basis for treatment  or other patient management decisions. A negative result may occur with  improper specimen collection/handling, submission of specimen other than nasopharyngeal swab, presence of viral mutation(s) within the areas targeted by this assay, and inadequate number of viral copies(<138 copies/mL). A negative result must be combined with clinical observations, patient history, and epidemiological information. The expected result is Negative.  Fact Sheet for Patients:  BloggerCourse.com  Fact Sheet for Healthcare Providers:  SeriousBroker.it  This test is no t yet approved or cleared by the [place] FDA and  has been authorized for detection and/or diagnosis of SARS-CoV-2 by FDA under an Emergency Use Authorization (EUA). This EUA will remain  in effect (meaning this test can be used) for the duration of the COVID-19 declaration under Section 564(b)(1) of the Act, 21 U.S.C.section 360bbb-3(b)(1), unless the authorization is terminated  or revoked sooner.       Influenza A by PCR NEGATIVE NEGATIVE Final   Influenza B by PCR NEGATIVE NEGATIVE Final    Comment: (NOTE) The Xpert Xpress SARS-CoV-2/FLU/RSV plus assay is intended as an aid in the diagnosis of influenza from Nasopharyngeal swab specimens and should not be used as a sole basis for treatment. Nasal washings and aspirates are unacceptable for Xpert Xpress SARS-CoV-2/FLU/RSV testing.  Fact Sheet for Patients: BloggerCourse.com  Fact Sheet for Healthcare Providers: SeriousBroker.it  This test is not yet approved or cleared by the [place] FDA and has been  authorized for detection and/or diagnosis of SARS-CoV-2 by FDA under an Emergency Use Authorization (EUA). This EUA will remain in effect (meaning this test can be used) for the duration of the COVID-19 declaration under Section 564(b)(1) of the Act, 21 U.S.C. section 360bbb-3(b)(1), unless the authorization is terminated or revoked.     Resp Syncytial Virus by PCR NEGATIVE NEGATIVE Final    Comment: (NOTE) Fact Sheet for Patients: BloggerCourse.com  Fact Sheet for Healthcare Providers: SeriousBroker.it  This test is not yet approved or cleared by the [place] FDA and has been authorized for detection and/or diagnosis of SARS-CoV-2 by FDA under an Emergency Use Authorization (EUA). This EUA will remain in effect (meaning this test can be used) for the duration of the COVID-19 declaration under Section 564(b)(1) of the Act, 21 U.S.C. section 360bbb-3(b)(1), unless the authorization is terminated or revoked.  Performed at [facility], [address] [place], [place] [address]     Procedures and diagnostic studies:  US Venous Img Lower Bilateral  Result Date: 09/17/2022 CLINICAL DATA:  Bilateral lower extremity pain and edema for the past week, left-greater-than-right. History of smoking. Evaluate for DVT. EXAM: BILATERAL LOWER EXTREMITY VENOUS DOPPLER ULTRASOUND TECHNIQUE: Gray-scale sonography with graded compression, as well as color Doppler and duplex ultrasound were performed to evaluate the lower extremity deep venous systems from the level of the common femoral vein and including the common femoral, femoral, profunda femoral, popliteal and calf veins including the posterior tibial, peroneal and gastrocnemius veins when visible. The superficial great saphenous vein was also interrogated. Spectral Doppler was utilized to evaluate flow at rest and with distal augmentation maneuvers in the common femoral, femoral and  popliteal veins. COMPARISON:  Bilateral lower extremity venous Doppler ultrasound-05/27/2022 (negative); left lower extremity venous Doppler ultrasound-03/29/2020 (negative) FINDINGS: RIGHT LOWER EXTREMITY Common Femoral Vein: No evidence of thrombus. Normal compressibility, respiratory phasicity and response to augmentation. Saphenofemoral Junction: No evidence of thrombus. Normal compressibility and flow on color Doppler imaging. Profunda Femoral Vein: No evidence of thrombus. Normal compressibility and flow on color Doppler  imaging. Femoral Vein: No evidence of thrombus. Normal compressibility, respiratory phasicity and response to augmentation. Popliteal Vein: No evidence of thrombus. Normal compressibility, respiratory phasicity and response to augmentation. Calf Veins: No evidence of thrombus. Normal compressibility and flow on color Doppler imaging. Superficial Great Saphenous Vein: No evidence of thrombus. Normal compressibility. Other Findings:  None. LEFT LOWER EXTREMITY Common Femoral Vein: No evidence of thrombus. Normal compressibility, respiratory phasicity and response to augmentation. Saphenofemoral Junction: No evidence of thrombus. Normal compressibility and flow on color Doppler imaging. Profunda Femoral Vein: No evidence of thrombus. Normal compressibility and flow on color Doppler imaging. Femoral Vein: No evidence of thrombus. Normal compressibility, respiratory phasicity and response to augmentation. Popliteal Vein: No evidence of thrombus. Normal compressibility, respiratory phasicity and response to augmentation. Calf Veins: No evidence of thrombus. Normal compressibility and flow on color Doppler imaging. Superficial Great Saphenous Vein: No evidence of thrombus. Normal compressibility. Other Findings: There is a minimal to moderate amount of subcutaneous edema at the level of the left lower leg and calf. IMPRESSION: No evidence of DVT within either lower extremity. Electronically Signed    By: Simonne Come M.D.   On: 09/17/2022 12:04   DG Chest 2 View  Result Date: 09/17/2022 CLINICAL DATA:  Chest pain with bilateral ankle swelling for 2 days. EXAM: CHEST - 2 VIEW COMPARISON:  Radiographs 05/11/2022 and 05/05/2022. FINDINGS: The heart size is stable at the upper limits of normal. The mediastinal contours are normal. Interval increased interstitial and visual thickening suspicious for mild pulmonary edema. No confluent airspace disease, pleural effusion or pneumothorax. The bones appear unremarkable. Telemetry leads overlie the chest. IMPRESSION: Borderline heart size with interval increased interstitial and vascular thickening suspicious for mild pulmonary edema. Electronically Signed   By: Carey Bullocks M.D.   On: 09/17/2022 11:46               LOS: 1 day   Viviene Thurston  [facility]   Pager on [email or web address]. If 7PM-7AM, please contact night-coverage at [email or web address]     09/18/2022, 1:28 PM

## 2022-09-18 NOTE — Progress Notes (Signed)
[facility] HF Stewardship  PCP: Pcp, No  PCP-Cardiologist: None  HPI: Wyatt Montgomery is a 67 y.o. male with alcohol use disorder, alcohol induced chronic pancreatitis, pancreatitic cyst, chronic microcytic anemia, chronic thrombocytosis, portal vein thrombosis on Eliquis who presented with chest pain.   Lower extremity swelling gradually worsened in the 1.5 weeks prior to admission. Reports falls at home due to heaviness of legs. Also developed a cough and shortness of breath.  Endorsed 1 episode of nausea and vomiting due to drinking liquor the day prior to admission.  Reported a recent increase in stress after his wife was hospitalized for CVA. Reported using cocaine approximately once a week with the last use approximately 4 days prior to admission. Markedly hypertensive on admission and BNP elevated at 530.    Pertinent Lab Values: Creatinine, Ser  Date Value Ref Range Status  09/18/2022 1.12 0.61 - 1.24 mg/dL Final   BUN  Date Value Ref Range Status  09/18/2022 12 8 - 23 mg/dL Final   Potassium  Date Value Ref Range Status  09/18/2022 3.8 3.5 - 5.1 mmol/L Final   Sodium  Date Value Ref Range Status  09/18/2022 127 (L) 135 - 145 mmol/L Final   B Natriuretic Peptide  Date Value Ref Range Status  09/17/2022 530.6 (H) 0.0 - 100.0 pg/mL Final    Comment:    Performed at [facility], [address] [place], [place] [address]   Magnesium  Date Value Ref Range Status  09/18/2022 2.2 1.7 - 2.4 mg/dL Final    Comment:    Performed at [facility], [address] [place], [place] [address]   Hgb A1c MFr Bld  Date Value Ref Range Status  05/29/2019 5.6 4.8 - 5.6 % Final    Comment:    (NOTE) Pre diabetes:          5.7%-6.4% Diabetes:              >6.4% Glycemic control for   <7.0% adults with diabetes    TSH  Date Value Ref Range Status  09/17/2022 3.157 0.350 - 4.500 uIU/mL Final    Comment:    Performed by a 3rd Generation assay with a functional  sensitivity of <=0.01 uIU/mL. Performed at [facility], [address] [place], [place] [address]    LDH  Date Value Ref Range Status  01/10/2020 277 (H) 98 - 192 U/L Final    Comment:    Performed at [facility], [address] [place], [place] [address]    Vital Signs: Temp:  [97.6 F (36.4 C)-98.7 F (37.1 C)] 98.7 F (37.1 C) (08/30 0531) Pulse Rate:  [73-93] 87 (08/30 0531) Cardiac Rhythm: Normal sinus rhythm;Bundle branch block (08/29 1915) Resp:  [15-20] 20 (08/30 0129) BP: (146-180)/(75-98) 167/83 (08/30 0531) SpO2:  [99 %-100 %] 99 % (08/30 0531) Weight:  [62.9 kg (138 lb 11.2 oz)-63.5 kg (140 lb)] 62.9 kg (138 lb 11.2 oz) (08/30 0704)   Intake/Output Summary (Last 24 hours) at 09/18/2022 0734 Last data filed at 09/18/2022 0615 Gross per 24 hour  Intake 1220 ml  Output 2575 ml  Net -1355 ml    Assessment/Plan: 1) Medication changes recommended at this time: -Unable to provide full recommendations since TTE is pending. Regardless of LVEF, patient would likely benefit from afterload reduction with ARB. -Consider starting losartan 25 mg daily  Thank you for involving pharmacy in this patient's care.  Enos Fling, PharmD, BCPS Phone - [phone] Clinical  Pharmacist 09/18/2022 1:26 PM

## 2022-09-18 NOTE — TOC Progression Note (Signed)
Transition of Care [facility]) - Progression Note    Patient Details  Name: Wyatt Montgomery MRN: [id number] Date of Birth: 1955-10-14  Transition of Care [facility]) CM/SW Contact  Truddie Hidden, RN Phone Number: 09/18/2022, 3:57 PM  Clinical Narrative:    Spoke with patient at the bedside to give [facility] information. Patient stated he thought he had MCD. Biomedical engineer for  update.  f       Expected Discharge Plan and Services                                               Social Determinants of Health (SDOH) Interventions SDOH Screenings   Food Insecurity: No Food Insecurity (05/05/2022)  Housing: Low Risk  (05/05/2022)  Transportation Needs: No Transportation Needs (05/05/2022)  Utilities: Not At Risk (05/05/2022)  Tobacco Use: High Risk (05/27/2022)    Readmission Risk Interventions     No data to display

## 2022-09-18 NOTE — Progress Notes (Signed)
*  PRELIMINARY RESULTS* Echocardiogram 2D Echocardiogram has been performed.  Cristela Blue 09/18/2022, 1:53 PM

## 2022-09-18 NOTE — Progress Notes (Signed)
Initial Nutrition Assessment  DOCUMENTATION CODES:   Non-severe (moderate) malnutrition in context of chronic illness  INTERVENTION:   -Ensure Enlive po BID, each supplement provides 350 kcal and 20 grams of protein.  -MVI with minerals daily  NUTRITION DIAGNOSIS:   Moderate Malnutrition related to chronic illness (ETOH abuse) as evidenced by mild fat depletion, moderate fat depletion, mild muscle depletion, moderate muscle depletion.  GOAL:   Patient will meet greater than or equal to 90% of their needs  MONITOR:   PO intake, Supplement acceptance  REASON FOR ASSESSMENT:   Malnutrition Screening Tool    ASSESSMENT:   Pt with medical history significant of alcohol use disorder, alcohol induced chronic pancreatitis, pancreatitic cyst, chronic microcytic anemia, chronic thrombocytosis, portal vein thrombosis on Eliquis, who presents due to chest pain.  Pt admitted with acute CHF and polysubstance abuse.   Reviewed I/O's: -1.4 L x 24 hours  UOP: 2.6 L x 24 hours  Spoke with pt at bedside, who was pleasant and in good spirits today. Pt shares that he is feeling a little bit better today. Pt shares that he has a good appetite and consumed most of his breakfast. PTA he was consuming 2-3 meals per day, which consist of hotdogs/ hamburgers and french fries or spaghetti and bread. Pt reports he does his own cooking and grocery shopping and receives $70 in food stamps per month. He denies any food insecurity. Pt shares that he does not have any teeth and lost his dentures several years ago; he is thinking about getting new ones soon. He does not have difficulty chewing foods as long as "the meat is cut up real good". He reports no difficulty with hospital food.   Pt denies any weight loss. He is unsure of UBW. Reviewed wt hx; pt has experienced a 4.4% wt loss over the past 4 months, which is not significant for time frame. Noted pt with mild edema, which may be masking further weight  loss as well as fat and muscle depletions.  Discussed importance of good meal and supplement intake to promote healing. Pt amenable to supplements.   Medications reviewed and include folic acid, lasix, and thiamine.   Lab Results  Component Value Date   HGBA1C 5.6 05/29/2019   PTA DM medications are none.   Labs reviewed: Na: 127, CBGS: 128 (inpatient orders for glycemic control are none).    NUTRITION - FOCUSED PHYSICAL EXAM:  Flowsheet Row Most Recent Value  Orbital Region Moderate depletion  Upper Arm Region Mild depletion  Thoracic and Lumbar Region Moderate depletion  Buccal Region Moderate depletion  Temple Region Severe depletion  Clavicle Bone Region Moderate depletion  Clavicle and Acromion Bone Region Moderate depletion  Scapular Bone Region Moderate depletion  Dorsal Hand Mild depletion  Patellar Region Moderate depletion  Anterior Thigh Region Moderate depletion  Posterior Calf Region Moderate depletion  Edema (RD Assessment) Mild  Hair Reviewed  Eyes Reviewed  Mouth Reviewed  Skin Reviewed  Nails Reviewed       Diet Order:   Diet Order             Diet regular Room service appropriate? Yes; Fluid consistency: Thin; Fluid restriction: 2000 mL Fluid  Diet effective now                   EDUCATION NEEDS:   Education needs have been addressed  Skin:  Skin Assessment: Reviewed RN Assessment  Last BM:  09/18/22 (type 2)  Height:  Ht Readings from Last 1 Encounters:  05/05/22 5\' 10"  (1.778 m)    Weight:   Wt Readings from Last 1 Encounters:  09/18/22 62.9 kg    Ideal Body Weight:  75.5 kg  BMI:  Body mass index is 19.9 kg/m.  Estimated Nutritional Needs:   Kcal:  1850-2050  Protein:  90-105 grams  Fluid:  > 1.8 L    Levada Schilling, RD, LDN, CDCES Registered Dietitian II Certified Diabetes Care and Education Specialist Please refer to [facility] for RD and/or RD on-call/weekend/after hours pager

## 2022-09-18 NOTE — Consult Note (Signed)
PHARMACY CONSULT NOTE - ELECTROLYTES  Pharmacy Consult for Electrolyte Monitoring and Replacement   Recent Labs: Weight: 62.9 kg (138 lb 11.2 oz) Estimated Creatinine Clearance: 59.3 mL/min (by C-G formula based on SCr of 1.12 mg/dL). Potassium (mmol/L)  Date Value  09/18/2022 3.8   Magnesium (mg/dL)  Date Value  41/32/4401 2.2   Calcium (mg/dL)  Date Value  02/72/5366 9.2   Albumin (g/dL)  Date Value  44/03/4740 3.2 (L)   Phosphorus (mg/dL)  Date Value  59/56/3875 3.5   Sodium (mmol/L)  Date Value  09/18/2022 127 (L)   Corrected Ca: 10.52 mg/dL  Assessment  Wyatt Montgomery is a 67 y.o. male presenting with chest pain. PMH significant for alcohol use disorder, alcohol-induced chronic pancreatitis, pancreatic cyst, chronic microcytic anemia, chronic thrombocytosis, portal vein thrombosis(on apixaban). Pharmacy has been consulted to monitor and replace electrolytes.  Diet: regular Pertinent medications: furosemide 40mg  IV daily  Goal of Therapy: Electrolytes WNL  Plan:  No additional supplementation is needed at this time. Check BMP with AM labs  Thank you for allowing pharmacy to be a part of this patient's care.  Merryl Hacker, PharmD Clinical Pharmacist 09/18/2022 7:17 AM

## 2022-09-18 NOTE — Plan of Care (Signed)

## 2022-09-18 NOTE — Evaluation (Signed)
Physical Therapy Evaluation Patient Details Name: Wyatt Montgomery MRN: [id number] DOB: June 20, 1955 Today's Date: 09/18/2022  History of Present Illness  Pt is a 67 y.o. male with medical history significant of alcohol use disorder, alcohol induced chronic pancreatitis, pancreatitic cyst, chronic microcytic anemia, chronic thrombocytosis, portal vein thrombosis on Eliquis, who presents to the ED due to chest pain.  MD assessment includes: acute heart failure, hyponatremia, polysubstance abuse, iron deficiency anemia, and thrombosis of the portal vein.   Clinical Impression  Pt was pleasant and motivated to participate during the session and put forth good effort throughout. Pt required no physical assistance during the session and was generally steady with standing activities.  Pt did drift slightly with amb with a SPC but was able to self-correct without assist, no overt LOB including during picking up objects from the floor without UE support.  Pt noted that his general strength and activity tolerance is not at baseline and was only able to amb a max self-selected distance of around 100 feet before fatiguing and needing to return to sitting.  Pt will benefit from continued PT services upon discharge to safely address deficits listed in patient problem list for decreased caregiver assistance and eventual return to PLOF.          If plan is discharge home, recommend the following: A little help with walking and/or transfers;Assist for transportation   Can travel by private Interior and spatial designer;Other (comment) (Pt stated that his current cane is broken)  Recommendations for Other Services       Functional Status Assessment Patient has had a recent decline in their functional status and demonstrates the ability to make significant improvements in function in a reasonable and predictable amount of time.     Precautions / Restrictions Precautions Precautions:  None Restrictions Weight Bearing Restrictions: No      Mobility  Bed Mobility Overal bed mobility: Independent             General bed mobility comments: Good speed and effort with no need of the bed rail    Transfers Overall transfer level: Modified independent                 General transfer comment: Good eccentric and concentric control and stability    Ambulation/Gait Ambulation/Gait assistance: Supervision Gait Distance (Feet): 100 Feet Assistive device: Straight cane Gait Pattern/deviations: Step-through pattern, Decreased step length - right, Decreased step length - left, Drifts right/left Gait velocity: decreased     General Gait Details: Pt generally steady with a SPC with occasional mild drifting left/right but no overt LOB  Stairs            Wheelchair Mobility     Tilt Bed    Modified Rankin (Stroke Patients Only)       Balance Overall balance assessment: Needs assistance   Sitting balance-Leahy Scale: Normal     Standing balance support: No upper extremity supported Standing balance-Leahy Scale: Good Standing balance comment: Pt able to weight shift and pick up object from floor without UE support with no LOB                             Pertinent Vitals/Pain Pain Assessment Pain Assessment: 0-10 Pain Score: 6  Pain Location: BUEs Pain Descriptors / Indicators: Sore Pain Intervention(s): Patient requesting pain meds-RN notified, Monitored during session, Repositioned    Home Living Family/patient expects  to be discharged to:: Private residence Living Arrangements: Spouse/significant other Available Help at Discharge: Other (Comment);Family (Spouse with recent CVA and remains in health care system at time of eval) Type of Home: House Home Access: Level entry       Home Layout: One level Home Equipment: Grab bars - tub/shower;Rolling Walker (2 wheels);Cane - single point Additional Comments: pt states he  does not have a car right now    Prior Function Prior Level of Function : Independent/Modified Independent             Mobility Comments: Ind amb in the home without an AD, SPC in the community, has a bicycle that he uses secondary to having no car ADLs Comments: Ind with ADLs     Extremity/Trunk Assessment   Upper Extremity Assessment Upper Extremity Assessment: Generalized weakness    Lower Extremity Assessment Lower Extremity Assessment: Generalized weakness       Communication   Communication Communication: No apparent difficulties  Cognition Arousal: Alert Behavior During Therapy: WFL for tasks assessed/performed Overall Cognitive Status: Within Functional Limits for tasks assessed                                          General Comments      Exercises     Assessment/Plan    PT Assessment Patient needs continued PT services  PT Problem List Decreased strength;Decreased activity tolerance;Decreased balance       PT Treatment Interventions DME instruction;Functional mobility training;Therapeutic exercise;Therapeutic activities;Balance training;Patient/family education    PT Goals (Current goals can be found in the Care Plan section)  Acute Rehab PT Goals Patient Stated Goal: To get stronger and walk better PT Goal Formulation: With patient Time For Goal Achievement: 10/01/22 Potential to Achieve Goals: Good    Frequency Min 1X/week     Co-evaluation               AM-PAC PT "6 Clicks" Mobility  Outcome Measure Help needed turning from your back to your side while in a flat bed without using bedrails?: None Help needed moving from lying on your back to sitting on the side of a flat bed without using bedrails?: None Help needed moving to and from a bed to a chair (including a wheelchair)?: None Help needed standing up from a chair using your arms (e.g., wheelchair or bedside chair)?: None Help needed to walk in hospital room?:  A Little Help needed climbing 3-5 steps with a railing? : A Little 6 Click Score: 22    End of Session Equipment Utilized During Treatment: Gait belt Activity Tolerance: Patient tolerated treatment well Patient left: in bed;with nursing/sitter in room;with call bell/phone within reach Nurse Communication: Mobility status PT Visit Diagnosis: Difficulty in walking, not elsewhere classified (R26.2);Muscle weakness (generalized) (M62.81)    Time: 1610-9604 PT Time Calculation (min) (ACUTE ONLY): 14 min   Charges:   PT Evaluation $PT Eval Moderate Complexity: 1 Mod   PT General Charges $$ ACUTE PT VISIT: 1 Visit       D. Scott Tannia Contino PT, DPT 09/18/22, 10:19 AM

## 2022-09-18 NOTE — Plan of Care (Signed)
  Problem: Education: Goal: Knowledge of General Education information will improve Description: Including pain rating scale, medication(s)/side effects and non-pharmacologic comfort measures Outcome: Progressing   Problem: Clinical Measurements: Goal: Will remain free from infection Outcome: Progressing Goal: Diagnostic test results will improve Outcome: Progressing   

## 2022-09-18 NOTE — Evaluation (Signed)
Occupational Therapy Evaluation Patient Details Name: Wyatt Montgomery MRN: [id number] DOB: August 13, 1955 Today's Date: 09/18/2022   History of Present Illness Pt is a 67 y.o. male with medical history significant of alcohol use disorder, alcohol induced chronic pancreatitis, pancreatitic cyst, chronic microcytic anemia, chronic thrombocytosis, portal vein thrombosis on Eliquis, who presents to the ED due to chest pain.  MD assessment includes: acute heart failure, hyponatremia, polysubstance abuse, iron deficiency anemia, and thrombosis of the portal vein.   Clinical Impression   Pt was seen for OT evaluation this date. Prior to hospital admission, pt was MOD I with cane, IND with ADLs. Pt lives with spouse (spouse currently in SNF after CVA). Pt is currently functioning at ADL baseline - completes mobility and toileting tasks in standing t/f bathroom, no AD, distant supervision for safety by OT (RN endorses pt is MOD IND in room). Picks up objects from floor no LOB, denies recent falls. Pt currently does not have any skilled OT needs at this time, OT to complete orders and sign off. Do not anticipate the need for follow up OT services upon acute hospital DC.        If plan is discharge home, recommend the following: Assist for transportation;A lot of help with walking and/or transfers    Functional Status Assessment  Patient has not had a recent decline in their functional status  Equipment Recommendations  None recommended by OT    Recommendations for Other Services       Precautions / Restrictions Precautions Precautions: None Restrictions Weight Bearing Restrictions: No      Mobility Bed Mobility Overal bed mobility: Independent             General bed mobility comments: Appropriate speed, no use of bed rail    Transfers Overall transfer level: Modified independent                        Balance Overall balance assessment: Modified Independent, Mild deficits  observed, not formally tested             Standing balance comment: pt picks up objects from floor, no LOB, dances in place. Pt states he feels generally weakned                           ADL either performed or assessed with clinical judgement   ADL Overall ADL's : At baseline;Modified independent                                       General ADL Comments: Pt observed completing bed mobility IND, jumps up to walk quickly to bathroom without AD, completes standing void and hand hygiene (distant supervision by OT for safety but MOD I/IND performance), RN reports pt is completely IND and has been going to bathroom since admit.      Pertinent Vitals/Pain Pain Assessment Pain Assessment: No/denies pain Pain Descriptors / Indicators: Other (Comment) (Unrated pain in B arms)     Extremity/Trunk Assessment Upper Extremity Assessment Upper Extremity Assessment: Generalized weakness   Lower Extremity Assessment Lower Extremity Assessment: Generalized weakness       Communication Communication Communication: No apparent difficulties   Cognition Arousal: Alert Behavior During Therapy: WFL for tasks assessed/performed Overall Cognitive Status: Within Functional Limits for tasks assessed  Home Living Family/patient expects to be discharged to:: Private residence Living Arrangements: Spouse/significant other Available Help at Discharge: Other (Comment);Family (Spouse with recent CVA and remains in health care system at time of eval) Type of Home: House Home Access: Level entry     Home Layout: One level     Bathroom Shower/Tub: Chief Strategy Officer: Standard     Home Equipment: Grab bars - tub/shower;Rolling Environmental consultant (2 wheels);Cane - single point   Additional Comments: pt states he does not have a car right now      Prior Functioning/Environment Prior Level of  Function : Independent/Modified Independent             Mobility Comments: Ind amb in the home without an AD, SPC in the community, has a bicycle that he uses secondary to having no car ADLs Comments: Ind with ADLs         AM-PAC OT "6 Clicks" Daily Activity     Outcome Measure Help from another person eating meals?: None Help from another person taking care of personal grooming?: None Help from another person toileting, which includes using toliet, bedpan, or urinal?: None Help from another person bathing (including washing, rinsing, drying)?: None Help from another person to put on and taking off regular upper body clothing?: None Help from another person to put on and taking off regular lower body clothing?: None 6 Click Score: 24   End of Session Nurse Communication: Mobility status;Other (comment) (pt requests ice cream, OT provided)  Activity Tolerance: Patient tolerated treatment well Patient left: in bed;with call bell/phone within reach                   Time: 0902-0919 OT Time Calculation (min): 17 min Charges:  OT General Charges $OT Visit: 1 Visit OT Evaluation $OT Eval Low Complexity: 1 Low OT Treatments $Self Care/Home Management : 8-22 mins Ulyses Panico L. Sadee Osland, OTR/L  09/18/22, 10:33 AM

## 2022-09-19 DIAGNOSIS — E440 Moderate protein-calorie malnutrition: Secondary | ICD-10-CM | POA: Insufficient documentation

## 2022-09-19 DIAGNOSIS — I5031 Acute diastolic (congestive) heart failure: Secondary | ICD-10-CM

## 2022-09-19 LAB — BASIC METABOLIC PANEL
Anion gap: 9 (ref 5–15)
BUN: 12 mg/dL (ref 8–23)
CO2: 22 mmol/L (ref 22–32)
Calcium: 9.2 mg/dL (ref 8.9–10.3)
Chloride: 96 mmol/L — ABNORMAL LOW (ref 98–111)
Creatinine, Ser: 1.06 mg/dL (ref 0.61–1.24)
GFR, Estimated: >60 mL/min (ref >60)
Glucose, Bld: 63 mg/dL — ABNORMAL LOW (ref 70–99)
Potassium: 4 mmol/L (ref 3.5–5.1)
Sodium: 127 mmol/L — ABNORMAL LOW (ref 135–145)

## 2022-09-19 LAB — CBC
HCT: 24.2 % — ABNORMAL LOW (ref 39.0–52.0)
Hemoglobin: 7.9 g/dL — ABNORMAL LOW (ref 13.0–17.0)
MCH: 22.3 pg — ABNORMAL LOW (ref 26.0–34.0)
MCHC: 32.6 g/dL (ref 30.0–36.0)
MCV: 68.2 fL — ABNORMAL LOW (ref 80.0–100.0)
Platelets: 469 10*3/uL — ABNORMAL HIGH (ref 150–400)
RBC: 3.55 MIL/uL — ABNORMAL LOW (ref 4.22–5.81)
RDW: 16.6 % — ABNORMAL HIGH (ref 11.5–15.5)
WBC: 6.3 10*3/uL (ref 4.0–10.5)
nRBC: 0 % (ref 0.0–0.2)

## 2022-09-19 LAB — MAGNESIUM: Magnesium: 2.2 mg/dL (ref 1.7–2.4)

## 2022-09-19 LAB — PHOSPHORUS: Phosphorus: 3.3 mg/dL (ref 2.5–4.6)

## 2022-09-19 MED ORDER — LOSARTAN POTASSIUM 25 MG PO TABS
25.0000 mg | ORAL_TABLET | Freq: Once | ORAL | Status: AC
  Administered 2022-09-19: 25 mg via ORAL
  Filled 2022-09-19: qty 1

## 2022-09-19 MED ORDER — LOSARTAN POTASSIUM 50 MG PO TABS
50.0000 mg | ORAL_TABLET | Freq: Every day | ORAL | Status: DC
  Administered 2022-09-20 – 2022-09-21 (×2): 50 mg via ORAL
  Filled 2022-09-19 (×2): qty 1

## 2022-09-19 MED ORDER — SODIUM CHLORIDE 0.9 % IV SOLN
200.0000 mg | Freq: Once | INTRAVENOUS | Status: AC
  Administered 2022-09-19: 200 mg via INTRAVENOUS
  Filled 2022-09-19: qty 200

## 2022-09-19 MED ORDER — FUROSEMIDE 10 MG/ML IJ SOLN
40.0000 mg | Freq: Two times a day (BID) | INTRAMUSCULAR | Status: AC
  Administered 2022-09-19 – 2022-09-20 (×2): 40 mg via INTRAVENOUS
  Filled 2022-09-19 (×2): qty 4

## 2022-09-19 MED ORDER — MENTHOL 3 MG MT LOZG
1.0000 | LOZENGE | OROMUCOSAL | Status: DC | PRN
  Administered 2022-09-19 – 2022-09-21 (×3): 3 mg via ORAL
  Filled 2022-09-19 (×2): qty 9

## 2022-09-19 NOTE — Consult Note (Signed)
PHARMACY CONSULT NOTE - ELECTROLYTES  Pharmacy Consult for Electrolyte Monitoring and Replacement   Recent Labs: Height: 5\' 7"  (170.2 cm) Weight: 61.6 kg (135 lb 12.8 oz) IBW/kg (Calculated) : 66.1 Estimated Creatinine Clearance: 61.3 mL/min (by C-G formula based on SCr of 1.06 mg/dL). Potassium (mmol/L)  Date Value  09/19/2022 4.0   Magnesium (mg/dL)  Date Value  16/10/9602 2.2   Calcium (mg/dL)  Date Value  54/09/8117 9.2   Albumin (g/dL)  Date Value  14/78/2956 3.2 (L)   Phosphorus (mg/dL)  Date Value  21/30/8657 3.3   Sodium (mmol/L)  Date Value  09/19/2022 127 (L)   Corrected Ca: 9.84 mg/dL  Assessment  Wyatt Montgomery is a 67 y.o. male presenting with chest pain. PMH significant for alcohol use disorder, alcohol-induced chronic pancreatitis, pancreatic cyst, chronic microcytic anemia, chronic thrombocytosis, portal vein thrombosis(on apixaban). Pharmacy has been consulted to monitor and replace electrolytes.  Diet: regular Pertinent medications: furosemide 40mg  IV daily  Goal of Therapy: Electrolytes WNL  Plan:  No additional supplementation is needed at this time. Check BMP with AM labs  Thank you for allowing pharmacy to be a part of this patient's care.  Bettey Costa, PharmD Clinical Pharmacist 09/19/2022 8:24 AM

## 2022-09-19 NOTE — Progress Notes (Addendum)
Progress Note    Wyatt Montgomery  ZOX:[id number] DOB: 1955-03-25  DOA: 09/17/2022 PCP: Pcp, No      Brief Narrative:    Medical records reviewed and are as summarized below:  Wyatt Montgomery is a 67 y.o. male  with medical history significant of alcohol use disorder, alcohol induced chronic pancreatitis, pancreatitic cyst, chronic microcytic anemia, chronic thrombocytosis, portal vein thrombosis on Eliquis, who presented to the hospital because of chest pain, cough, shortness of breath and lower extremity swelling.  He has a hide nausea and vomiting the night prior to admission but he attributed this to drinking liquor.  He was admitted to the hospital for acute diastolic heart failure and hypertensive emergency.    Assessment/Plan:   Principal Problem:   Acute heart failure (HCC) Active Problems:   Hyponatremia   Polysubstance abuse (HCC)   Essential hypertension   Thrombosis, portal vein   Iron deficiency anemia   Hypokalemia   Malnutrition of moderate degree   Body mass index is 21.27 kg/m.    Acute diastolic heart failure: Improving.  Continue IV Lasix.  Monitor daily weight, urine output and BMP.  BNP 530.6. 2D echo is pending.   Chest pain: Troponins only marginally elevated to 24 and 28.  This is likely from demand ischemia.   Hypertensive emergency: BP still significantly elevated.  Increase losartan from 25 to 50 mg daily.   Hyponatremia: Sodium is still low although it is getting better.  Continue IV Lasix and monitor BMP.   Hypokalemia: Improved   Chronic Iron deficiency anemia: S/p IV iron sucrose infusion on 09/18/2022.  Repeat IV iron sucrose infusion today. Iron 13, ferritin 12, saturation ratio 3,  TIBC 384.   S/p fall at home: PT recommended home health PT   Polysubstance use disorder (alcohol and cocaine): counseled to quit using alcohol      Diet Order             Diet regular Room service appropriate? Yes; Fluid  consistency: Thin; Fluid restriction: 2000 mL Fluid  Diet effective now                            Consultants:   None  Procedures:  none    Medications:    enoxaparin (LOVENOX) injection  40 mg Subcutaneous Q24H   feeding supplement  237 mL Oral BID BM   folic acid  1 mg Oral Daily   furosemide  40 mg Intravenous BID   losartan  25 mg Oral Once   [START ON 09/20/2022] losartan  50 mg Oral Daily   multivitamin with minerals  1 tablet Oral Daily   sodium chloride flush  3 mL Intravenous Q12H   thiamine  100 mg Oral Daily   Or   thiamine  100 mg Intravenous Daily   Continuous Infusions:  sodium chloride       Anti-infectives (From admission, onward)    None              Family Communication/Anticipated D/C date and plan/Code Status   DVT prophylaxis: enoxaparin (LOVENOX) injection 40 mg Start: 09/17/22 1800     Code Status: Full Code  Family Communication: None Disposition Plan:   Plan to discharge home   Status is: Inpatient Remains inpatient appropriate because:  CHF       Subjective:   Interval events noted.  Breathing is better.  No chest pain.  He complains  of general weakness.  Objective:    Vitals:   09/19/22 0010 09/19/22 0309 09/19/22 0503 09/19/22 0843  BP: (!) 173/89 (!) 177/106 (!) 164/85 (!) 177/91  Pulse: 87 79 90 82  Resp: 18 20 15 18   Temp: 98.3 F (36.8 C) 98.5 F (36.9 C) 98.2 F (36.8 C) 97.7 F (36.5 C)  TempSrc:   Oral Oral  SpO2: 97% 100% 100% 100%  Weight:  61.6 kg    Height:       No data found.   Intake/Output Summary (Last 24 hours) at 09/19/2022 1213 Last data filed at 09/19/2022 0847 Gross per 24 hour  Intake 694.09 ml  Output 2200 ml  Net -1505.91 ml   Filed Weights   09/18/22 0704 09/18/22 1627 09/19/22 0309  Weight: 62.9 kg 62.9 kg 61.6 kg    Exam:  GEN: NAD SKIN: Warm and dry EYES: No pallor or icterus ENT: MMM CV: RRR PULM: CTA B ABD: soft, ND, NT, +BS CNS: AAO x 3,  non focal EXT: B/l leg edema is improving        Data Reviewed:   I have personally reviewed following labs and imaging studies:  Labs: Labs show the following:   Basic Metabolic Panel: Recent Labs  Lab 09/17/22 1048 09/17/22 1838 09/18/22 0322 09/19/22 0520  NA 120* 120* 127* 127*  K 3.4* 3.3* 3.8 4.0  CL 89* 91* 95* 96*  CO2 23 22 23 22   GLUCOSE 99 122* 109* 63*  BUN 13 13 12 12   CREATININE 1.12 1.21 1.12 1.06  CALCIUM 9.0 8.7* 9.2 9.2  MG  --   --  2.2 2.2  PHOS  --   --  3.5 3.3   GFR Estimated Creatinine Clearance: 61.3 mL/min (by C-G formula based on SCr of 1.06 mg/dL). Liver Function Tests: Recent Labs  Lab 09/17/22 1306  AST 105*  ALT 44  ALKPHOS 153*  BILITOT 0.4  PROT 7.4  ALBUMIN 3.2*   No results for input(s): "LIPASE", "AMYLASE" in the last 168 hours. No results for input(s): "AMMONIA" in the last 168 hours. Coagulation profile No results for input(s): "INR", "PROTIME" in the last 168 hours.  CBC: Recent Labs  Lab 09/17/22 1048 09/19/22 0520  WBC 5.0 6.3  HGB 7.8* 7.9*  HCT 25.0* 24.2*  MCV 69.6* 68.2*  PLT 415* 469*   Cardiac Enzymes: No results for input(s): "CKTOTAL", "CKMB", "CKMBINDEX", "TROPONINI" in the last 168 hours. BNP (last 3 results) No results for input(s): "PROBNP" in the last 8760 hours. CBG: No results for input(s): "GLUCAP" in the last 168 hours. D-Dimer: No results for input(s): "DDIMER" in the last 72 hours. Hgb A1c: No results for input(s): "HGBA1C" in the last 72 hours. Lipid Profile: No results for input(s): "CHOL", "HDL", "LDLCALC", "TRIG", "CHOLHDL", "LDLDIRECT" in the last 72 hours. Thyroid function studies: Recent Labs    09/17/22 1521  TSH 3.157   Anemia work up: Recent Labs    09/17/22 1521 09/18/22 0322  FERRITIN  --  12*  TIBC  --  384  IRON  --  13*  RETICCTPCT 2.1 2.2   Sepsis Labs: Recent Labs  Lab 09/17/22 1048 09/19/22 0520  WBC 5.0 6.3    Microbiology Recent Results  (from the past 240 hour(s))  Resp panel by RT-PCR (RSV, Flu A&B, Covid) Anterior Nasal Swab     Status: None   Collection Time: 09/17/22 11:10 AM   Specimen: Anterior Nasal Swab  Result Value Ref Range Status  SARS Coronavirus 2 by RT PCR NEGATIVE NEGATIVE Final    Comment: (NOTE) SARS-CoV-2 target nucleic acids are NOT DETECTED.  The SARS-CoV-2 RNA is generally detectable in upper respiratory specimens during the acute phase of infection. The lowest concentration of SARS-CoV-2 viral copies this assay can detect is 138 copies/mL. A negative result does not preclude SARS-Cov-2 infection and should not be used as the sole basis for treatment or other patient management decisions. A negative result may occur with  improper specimen collection/handling, submission of specimen other than nasopharyngeal swab, presence of viral mutation(s) within the areas targeted by this assay, and inadequate number of viral copies(<138 copies/mL). A negative result must be combined with clinical observations, patient history, and epidemiological information. The expected result is Negative.  Fact Sheet for Patients:  BloggerCourse.com  Fact Sheet for Healthcare Providers:  SeriousBroker.it  This test is no t yet approved or cleared by the [place] FDA and  has been authorized for detection and/or diagnosis of SARS-CoV-2 by FDA under an Emergency Use Authorization (EUA). This EUA will remain  in effect (meaning this test can be used) for the duration of the COVID-19 declaration under Section 564(b)(1) of the Act, 21 U.S.C.section 360bbb-3(b)(1), unless the authorization is terminated  or revoked sooner.       Influenza A by PCR NEGATIVE NEGATIVE Final   Influenza B by PCR NEGATIVE NEGATIVE Final    Comment: (NOTE) The Xpert Xpress SARS-CoV-2/FLU/RSV plus assay is intended as an aid in the diagnosis of influenza from Nasopharyngeal swab  specimens and should not be used as a sole basis for treatment. Nasal washings and aspirates are unacceptable for Xpert Xpress SARS-CoV-2/FLU/RSV testing.  Fact Sheet for Patients: BloggerCourse.com  Fact Sheet for Healthcare Providers: SeriousBroker.it  This test is not yet approved or cleared by the [place] FDA and has been authorized for detection and/or diagnosis of SARS-CoV-2 by FDA under an Emergency Use Authorization (EUA). This EUA will remain in effect (meaning this test can be used) for the duration of the COVID-19 declaration under Section 564(b)(1) of the Act, 21 U.S.C. section 360bbb-3(b)(1), unless the authorization is terminated or revoked.     Resp Syncytial Virus by PCR NEGATIVE NEGATIVE Final    Comment: (NOTE) Fact Sheet for Patients: BloggerCourse.com  Fact Sheet for Healthcare Providers: SeriousBroker.it  This test is not yet approved or cleared by the [place] FDA and has been authorized for detection and/or diagnosis of SARS-CoV-2 by FDA under an Emergency Use Authorization (EUA). This EUA will remain in effect (meaning this test can be used) for the duration of the COVID-19 declaration under Section 564(b)(1) of the Act, 21 U.S.C. section 360bbb-3(b)(1), unless the authorization is terminated or revoked.  Performed at [facility], [address] [place], [place] [address]     Procedures and diagnostic studies:  ECHOCARDIOGRAM COMPLETE  Result Date: 09/18/2022    ECHOCARDIOGRAM REPORT   Patient Name:   Wyatt Montgomery Date of Exam: 09/18/2022 Medical Rec #:  [id number]        Height:       70.0 in Accession #:    [id number]       Weight:       138.7 lb Date of Birth:  29-Aug-1955        BSA:          1.787 m Patient Age:    66 years         BP:  167/83 mmHg Patient Gender: M                HR:           87 bpm. Exam  Location:  [facility] Procedure: 2D Echo, Cardiac Doppler and Color Doppler Indications:     CHF-acute systolic I50.21  History:         Patient has no prior history of Echocardiogram examinations. No                  cardiac history on file.  Sonographer:     Cristela Blue Referring Phys:  [id number] Verdene Lennert Diagnosing Phys: Julien Nordmann MD IMPRESSIONS  1. Left ventricular ejection fraction, by estimation, is 50 to 55%. The left ventricle has low normal function. The left ventricle has no regional wall motion abnormalities. Left ventricular diastolic parameters are indeterminate.  2. Right ventricular systolic function is normal. The right ventricular size is normal.  3. The mitral valve is normal in structure. Mild mitral valve regurgitation. No evidence of mitral stenosis.  4. The aortic valve is normal in structure. Aortic valve regurgitation is mild. No aortic stenosis is present.  5. The inferior vena cava is normal in size with greater than 50% respiratory variability, suggesting right atrial pressure of 3 mmHg. FINDINGS  Left Ventricle: Left ventricular ejection fraction, by estimation, is 50 to 55%. The left ventricle has low normal function. The left ventricle has no regional wall motion abnormalities. The left ventricular internal cavity size was normal in size. There is no left ventricular hypertrophy. Left ventricular diastolic parameters are indeterminate. Right Ventricle: The right ventricular size is normal. No increase in right ventricular wall thickness. Right ventricular systolic function is normal. Left Atrium: Left atrial size was normal in size. Right Atrium: Right atrial size was normal in size. Pericardium: There is no evidence of pericardial effusion. Mitral Valve: The mitral valve is normal in structure. Mild mitral valve regurgitation. No evidence of mitral valve stenosis. MV peak gradient, 6.0 mmHg. The mean mitral valve gradient is 3.0 mmHg. Tricuspid Valve: The tricuspid valve is normal in  structure. Tricuspid valve regurgitation is mild . No evidence of tricuspid stenosis. Aortic Valve: The aortic valve is normal in structure. Aortic valve regurgitation is mild. No aortic stenosis is present. Aortic valve mean gradient measures 9.0 mmHg. Aortic valve peak gradient measures 13.7 mmHg. Aortic valve area, by VTI measures 2.32  cm. Pulmonic Valve: The pulmonic valve was normal in structure. Pulmonic valve regurgitation is not visualized. No evidence of pulmonic stenosis. Aorta: The aortic root is normal in size and structure. Venous: The inferior vena cava is normal in size with greater than 50% respiratory variability, suggesting right atrial pressure of 3 mmHg. IAS/Shunts: No atrial level shunt detected by color flow Doppler.  LEFT VENTRICLE PLAX 2D LVIDd:         4.90 cm   Diastology LVIDs:         3.30 cm   LV e' medial:    7.29 cm/s LV PW:         1.10 cm   LV E/e' medial:  16.6 LV IVS:        1.00 cm   LV e' lateral:   13.30 cm/s LVOT diam:     2.30 cm   LV E/e' lateral: 9.1 LV SV:         82 LV SV Index:   46 LVOT Area:     4.15 cm  RIGHT VENTRICLE RV  Basal diam:  2.90 cm RV Mid diam:    2.10 cm RV S prime:     14.60 cm/s TAPSE (M-mode): 2.6 cm LEFT ATRIUM             Index        RIGHT ATRIUM           Index LA diam:        2.90 cm 1.62 cm/m   RA Area:     14.50 cm LA Vol (A2C):   63.2 ml 35.37 ml/m  RA Volume:   34.40 ml  19.25 ml/m LA Vol (A4C):   73.7 ml 41.25 ml/m LA Biplane Vol: 69.2 ml 38.73 ml/m  AORTIC VALVE AV Area (Vmax):    2.06 cm AV Area (Vmean):   1.90 cm AV Area (VTI):     2.32 cm AV Vmax:           185.00 cm/s AV Vmean:          143.000 cm/s AV VTI:            0.353 m AV Peak Grad:      13.7 mmHg AV Mean Grad:      9.0 mmHg LVOT Vmax:         91.80 cm/s LVOT Vmean:        65.500 cm/s LVOT VTI:          0.197 m LVOT/AV VTI ratio: 0.56  AORTA Ao Root diam: 3.20 cm MITRAL VALVE                TRICUSPID VALVE MV Area (PHT): 6.27 cm     TR Peak grad:   9.7 mmHg MV Area VTI:    2.64 cm     TR Vmax:        156.00 cm/s MV Peak grad:  6.0 mmHg MV Mean grad:  3.0 mmHg     SHUNTS MV Vmax:       1.22 m/s     Systemic VTI:  0.20 m MV Vmean:      90.7 cm/s    Systemic Diam: 2.30 cm MV Decel Time: 121 msec MV E velocity: 121.00 cm/s MV A velocity: 94.90 cm/s MV E/A ratio:  1.28 Julien Nordmann MD Electronically signed by Julien Nordmann MD Signature Date/Time: 09/18/2022/3:46:49 PM    Final                LOS: 2 days   Lurene Shadow  [facility]   Pager on [email or web address]. If 7PM-7AM, please contact night-coverage at [email or web address]     09/19/2022, 12:13 PM

## 2022-09-19 NOTE — Plan of Care (Signed)
  Problem: Education: Goal: Knowledge of General Education information will improve Description: Including pain rating scale, medication(s)/side effects and non-pharmacologic comfort measures Outcome: Progressing   Problem: Health Behavior/Discharge Planning: Goal: Ability to manage health-related needs will improve Outcome: Progressing   Problem: Clinical Measurements: Goal: Will remain free from infection Outcome: Progressing Goal: Respiratory complications will improve Outcome: Progressing   Problem: Activity: Goal: Risk for activity intolerance will decrease Outcome: Progressing   Problem: Elimination: Goal: Will not experience complications related to bowel motility Outcome: Progressing Goal: Will not experience complications related to urinary retention Outcome: Progressing   Problem: Pain Managment: Goal: General experience of comfort will improve Outcome: Progressing   Problem: Skin Integrity: Goal: Risk for impaired skin integrity will decrease Outcome: Progressing   Problem: Coping: Goal: Level of anxiety will decrease Outcome: Not Progressing

## 2022-09-20 LAB — SODIUM: Sodium: 128 mmol/L — ABNORMAL LOW (ref 135–145)

## 2022-09-20 LAB — BASIC METABOLIC PANEL
Anion gap: 8 (ref 5–15)
BUN: 14 mg/dL (ref 8–23)
CO2: 22 mmol/L (ref 22–32)
Calcium: 8.8 mg/dL — ABNORMAL LOW (ref 8.9–10.3)
Chloride: 92 mmol/L — ABNORMAL LOW (ref 98–111)
Creatinine, Ser: 1.14 mg/dL (ref 0.61–1.24)
GFR, Estimated: >60 mL/min (ref >60)
Glucose, Bld: 84 mg/dL (ref 70–99)
Potassium: 3.7 mmol/L (ref 3.5–5.1)
Sodium: 122 mmol/L — ABNORMAL LOW (ref 135–145)

## 2022-09-20 LAB — HEPATIC FUNCTION PANEL
ALT: 27 U/L (ref 0–44)
AST: 38 U/L (ref 15–41)
Albumin: 2.8 g/dL — ABNORMAL LOW (ref 3.5–5.0)
Alkaline Phosphatase: 114 U/L (ref 38–126)
Bilirubin, Direct: <0.1 mg/dL (ref 0.0–0.2)
Total Bilirubin: 0.4 mg/dL (ref 0.3–1.2)
Total Protein: 6.6 g/dL (ref 6.5–8.1)

## 2022-09-20 LAB — MAGNESIUM: Magnesium: 1.9 mg/dL (ref 1.7–2.4)

## 2022-09-20 LAB — CBC
HCT: 23.9 % — ABNORMAL LOW (ref 39.0–52.0)
Hemoglobin: 7.7 g/dL — ABNORMAL LOW (ref 13.0–17.0)
MCH: 22.4 pg — ABNORMAL LOW (ref 26.0–34.0)
MCHC: 32.2 g/dL (ref 30.0–36.0)
MCV: 69.7 fL — ABNORMAL LOW (ref 80.0–100.0)
Platelets: 464 10*3/uL — ABNORMAL HIGH (ref 150–400)
RBC: 3.43 MIL/uL — ABNORMAL LOW (ref 4.22–5.81)
RDW: 16.9 % — ABNORMAL HIGH (ref 11.5–15.5)
WBC: 8.8 10*3/uL (ref 4.0–10.5)
nRBC: 0 % (ref 0.0–0.2)

## 2022-09-20 LAB — PHOSPHORUS: Phosphorus: 4.6 mg/dL (ref 2.5–4.6)

## 2022-09-20 MED ORDER — AMLODIPINE BESYLATE 5 MG PO TABS
5.0000 mg | ORAL_TABLET | Freq: Every day | ORAL | Status: DC
  Administered 2022-09-20 – 2022-09-21 (×2): 5 mg via ORAL
  Filled 2022-09-20 (×2): qty 1

## 2022-09-20 MED ORDER — TOLVAPTAN 15 MG PO TABS
15.0000 mg | ORAL_TABLET | Freq: Once | ORAL | Status: AC
  Administered 2022-09-20: 15 mg via ORAL
  Filled 2022-09-20: qty 1

## 2022-09-20 NOTE — Plan of Care (Signed)
  Problem: Education: Goal: Knowledge of General Education information will improve Description: Including pain rating scale, medication(s)/side effects and non-pharmacologic comfort measures Outcome: Progressing   Problem: Health Behavior/Discharge Planning: Goal: Ability to manage health-related needs will improve Outcome: Progressing   Problem: Clinical Measurements: Goal: Ability to maintain clinical measurements within normal limits will improve Outcome: Progressing Goal: Will remain free from infection Outcome: Progressing Goal: Respiratory complications will improve Outcome: Progressing   Problem: Activity: Goal: Risk for activity intolerance will decrease Outcome: Progressing   Problem: Nutrition: Goal: Adequate nutrition will be maintained Outcome: Progressing   Problem: Elimination: Goal: Will not experience complications related to bowel motility Outcome: Progressing   Problem: Clinical Measurements: Goal: Diagnostic test results will improve Outcome: Not Progressing   Problem: Coping: Goal: Level of anxiety will decrease Outcome: Not Progressing

## 2022-09-20 NOTE — Consult Note (Signed)
MEDICATION RELATED CONSULT NOTE - INITIAL   Pharmacy Consult for Tolvaptan monitoring Indication: hyponatremia   No Known Allergies  Patient Measurements: Height: 5\' 7"  (170.2 cm) Weight: 61.1 kg (134 lb 12.8 oz) IBW/kg (Calculated) : 66.1   Vital Signs: Temp: 98.3 F (36.8 C) (09/01 0738) BP: 146/82 (09/01 0738) Pulse Rate: 77 (09/01 0738) Intake/Output from previous day: 08/31 0701 - 09/01 0700 In: 240 [P.O.:240] Out: 1150 [Urine:1150] Intake/Output from this shift: Total I/O In: -  Out: 500 [Urine:500]  Labs: Recent Labs    09/17/22 1048 09/17/22 1306 09/17/22 1838 09/18/22 0322 09/19/22 0520 09/20/22 0446  WBC 5.0  --   --   --  6.3 8.8  HGB 7.8*  --   --   --  7.9* 7.7*  HCT 25.0*  --   --   --  24.2* 23.9*  PLT 415*  --   --   --  469* 464*  CREATININE 1.12  --    < > 1.12 1.06 1.14  MG  --   --   --  2.2 2.2 1.9  PHOS  --   --   --  3.5 3.3 4.6  ALBUMIN  --  3.2*  --   --   --   --   PROT  --  7.4  --   --   --   --   AST  --  105*  --   --   --   --   ALT  --  44  --   --   --   --   ALKPHOS  --  153*  --   --   --   --   BILITOT  --  0.4  --   --   --   --   BILIDIR  --  <0.1  --   --   --   --   IBILI  --  NOT CALCULATED  --   --   --   --    < > = values in this interval not displayed.   Estimated Creatinine Clearance: 56.6 mL/min (by C-G formula based on SCr of 1.14 mg/dL).   Medications:  furosemide 40mg  IV BID   Assessment: 67 y.o. male presenting with chest pain. PMH significant for alcohol use disorder, alcohol-induced chronic pancreatitis, presented to ED 09/17/22 with complaints of chest pain, cough, shortness of breath and lower extremity swelling. Found to have hyponatremia   09/20/22 9/1 0446 Na 122 9/1 1044 Tolvaptan 15 mg x 1 given  Goal of Therapy:  Na WNL  Plan:  Continue with Tolvaptan 15 mg x 1  Monitor Na q8h x 2 following dose  Sharen Hones, PharmD, BCPS Clinical Pharmacist   09/20/2022,9:26 AM

## 2022-09-20 NOTE — Progress Notes (Addendum)
Progress Note    Wyatt ZOBELL  Montgomery:[id number] DOB: 04-18-1955  DOA: 09/17/2022 PCP: Pcp, No      Brief Narrative:    Medical records reviewed and are as summarized below:  Wyatt Montgomery is a 67 y.o. male  with medical history significant of alcohol use disorder, alcohol induced chronic pancreatitis, pancreatitic cyst, chronic microcytic anemia, chronic thrombocytosis, portal vein thrombosis on Eliquis, who presented to the hospital because of chest pain, cough, shortness of breath and lower extremity swelling.  He has a hide nausea and vomiting the night prior to admission but he attributed this to drinking liquor.  He was admitted to the hospital for acute diastolic heart failure and hypertensive emergency.    Assessment/Plan:   Principal Problem:   Acute heart failure (HCC) Active Problems:   Hyponatremia   Polysubstance abuse (HCC)   Essential hypertension   Thrombosis, portal vein   Iron deficiency anemia   Hypokalemia   Malnutrition of moderate degree   Body mass index is 21.11 kg/m.    Acute diastolic heart failure: Shortness of breath and leg swelling have improved.  Discontinue IV Lasix.  Start oral Lasix tomorrow.    BNP 530.6. 2D echo showed EF estimated at 50 to 55%, indeterminate LV diastolic parameters.   Chest pain: Troponins only marginally elevated to 24 and 28.  This is likely from demand ischemia.   Hypertensive emergency: BP still significantly elevated.  Increase losartan from 25 to 50 mg daily.   Worsening hyponatremia: Sodium dropped from 127-122.  Start tolvaptan and monitor sodium level closely.     Hypokalemia: Improved   Chronic Iron deficiency anemia: S/p IV iron sucrose infusion on 09/18/2022 and 09/19/2022. Iron 13, ferritin 12, saturation ratio 3,  TIBC 384. Outpatient follow-up with PCP for further management.  Recommend colonoscopy as an outpatient as well.   S/p fall at home: PT recommended home health  PT   Polysubstance use disorder (alcohol and cocaine): counseled to quit using alcohol      Diet Order             Diet regular Room service appropriate? Yes; Fluid consistency: Thin; Fluid restriction: 2000 mL Fluid  Diet effective now                            Consultants:   None  Procedures:  none    Medications:    enoxaparin (LOVENOX) injection  40 mg Subcutaneous Q24H   feeding supplement  237 mL Oral BID BM   folic acid  1 mg Oral Daily   losartan  50 mg Oral Daily   multivitamin with minerals  1 tablet Oral Daily   sodium chloride flush  3 mL Intravenous Q12H   thiamine  100 mg Oral Daily   Or   thiamine  100 mg Intravenous Daily   Continuous Infusions:  sodium chloride       Anti-infectives (From admission, onward)    None              Family Communication/Anticipated D/C date and plan/Code Status   DVT prophylaxis: enoxaparin (LOVENOX) injection 40 mg Start: 09/17/22 1800     Code Status: Full Code  Family Communication: None Disposition Plan:   Plan to discharge home   Status is: Inpatient Remains inpatient appropriate because:  CHF       Subjective:   He complains of general weakness.  No shortness of  breath or chest pain.  Objective:    Vitals:   09/20/22 0011 09/20/22 0503 09/20/22 0738 09/20/22 1145  BP: (!) 170/83 (!) 168/88 (!) 146/82 (!) 149/64  Pulse: 85 80 77 75  Resp: 18 18 20 18   Temp: 98.3 F (36.8 C) 98.5 F (36.9 C) 98.3 F (36.8 C) 97.8 F (36.6 C)  TempSrc:      SpO2: 100% 100% 98% 100%  Weight:  61.1 kg    Height:       No data found.   Intake/Output Summary (Last 24 hours) at 09/20/2022 1418 Last data filed at 09/20/2022 1258 Gross per 24 hour  Intake 240 ml  Output 975 ml  Net -735 ml   Filed Weights   09/18/22 1627 09/19/22 0309 09/20/22 0503  Weight: 62.9 kg 61.6 kg 61.1 kg    Exam:  GEN: NAD SKIN: Warm and dry EYES: No pallor or icterus ENT: MMM CV:  RRR PULM: CTA B ABD: soft, ND, NT, +BS CNS: AAO x 3, non focal EXT: Edema of the lower extremities has improved.      Data Reviewed:   I have personally reviewed following labs and imaging studies:  Labs: Labs show the following:   Basic Metabolic Panel: Recent Labs  Lab 09/17/22 1048 09/17/22 1838 09/18/22 0322 09/19/22 0520 09/20/22 0446  NA 120* 120* 127* 127* 122*  K 3.4* 3.3* 3.8 4.0 3.7  CL 89* 91* 95* 96* 92*  CO2 23 22 23 22 22   GLUCOSE 99 122* 109* 63* 84  BUN 13 13 12 12 14   CREATININE 1.12 1.21 1.12 1.06 1.14  CALCIUM 9.0 8.7* 9.2 9.2 8.8*  MG  --   --  2.2 2.2 1.9  PHOS  --   --  3.5 3.3 4.6   GFR Estimated Creatinine Clearance: 56.6 mL/min (by C-G formula based on SCr of 1.14 mg/dL). Liver Function Tests: Recent Labs  Lab 09/17/22 1306 09/20/22 0446  AST 105* 38  ALT 44 27  ALKPHOS 153* 114  BILITOT 0.4 0.4  PROT 7.4 6.6  ALBUMIN 3.2* 2.8*   No results for input(s): "LIPASE", "AMYLASE" in the last 168 hours. No results for input(s): "AMMONIA" in the last 168 hours. Coagulation profile No results for input(s): "INR", "PROTIME" in the last 168 hours.  CBC: Recent Labs  Lab 09/17/22 1048 09/19/22 0520 09/20/22 0446  WBC 5.0 6.3 8.8  HGB 7.8* 7.9* 7.7*  HCT 25.0* 24.2* 23.9*  MCV 69.6* 68.2* 69.7*  PLT 415* 469* 464*   Cardiac Enzymes: No results for input(s): "CKTOTAL", "CKMB", "CKMBINDEX", "TROPONINI" in the last 168 hours. BNP (last 3 results) No results for input(s): "PROBNP" in the last 8760 hours. CBG: No results for input(s): "GLUCAP" in the last 168 hours. D-Dimer: No results for input(s): "DDIMER" in the last 72 hours. Hgb A1c: No results for input(s): "HGBA1C" in the last 72 hours. Lipid Profile: No results for input(s): "CHOL", "HDL", "LDLCALC", "TRIG", "CHOLHDL", "LDLDIRECT" in the last 72 hours. Thyroid function studies: Recent Labs    09/17/22 1521  TSH 3.157   Anemia work up: Recent Labs    09/17/22 1521  09/18/22 0322  FERRITIN  --  12*  TIBC  --  384  IRON  --  13*  RETICCTPCT 2.1 2.2   Sepsis Labs: Recent Labs  Lab 09/17/22 1048 09/19/22 0520 09/20/22 0446  WBC 5.0 6.3 8.8    Microbiology Recent Results (from the past 240 hour(s))  Resp panel by RT-PCR (RSV, Flu  A&B, Covid) Anterior Nasal Swab     Status: None   Collection Time: 09/17/22 11:10 AM   Specimen: Anterior Nasal Swab  Result Value Ref Range Status   SARS Coronavirus 2 by RT PCR NEGATIVE NEGATIVE Final    Comment: (NOTE) SARS-CoV-2 target nucleic acids are NOT DETECTED.  The SARS-CoV-2 RNA is generally detectable in upper respiratory specimens during the acute phase of infection. The lowest concentration of SARS-CoV-2 viral copies this assay can detect is 138 copies/mL. A negative result does not preclude SARS-Cov-2 infection and should not be used as the sole basis for treatment or other patient management decisions. A negative result may occur with  improper specimen collection/handling, submission of specimen other than nasopharyngeal swab, presence of viral mutation(s) within the areas targeted by this assay, and inadequate number of viral copies(<138 copies/mL). A negative result must be combined with clinical observations, patient history, and epidemiological information. The expected result is Negative.  Fact Sheet for Patients:  BloggerCourse.com  Fact Sheet for Healthcare Providers:  SeriousBroker.it  This test is no t yet approved or cleared by the [place] FDA and  has been authorized for detection and/or diagnosis of SARS-CoV-2 by FDA under an Emergency Use Authorization (EUA). This EUA will remain  in effect (meaning this test can be used) for the duration of the COVID-19 declaration under Section 564(b)(1) of the Act, 21 U.S.C.section 360bbb-3(b)(1), unless the authorization is terminated  or revoked sooner.       Influenza A by PCR  NEGATIVE NEGATIVE Final   Influenza B by PCR NEGATIVE NEGATIVE Final    Comment: (NOTE) The Xpert Xpress SARS-CoV-2/FLU/RSV plus assay is intended as an aid in the diagnosis of influenza from Nasopharyngeal swab specimens and should not be used as a sole basis for treatment. Nasal washings and aspirates are unacceptable for Xpert Xpress SARS-CoV-2/FLU/RSV testing.  Fact Sheet for Patients: BloggerCourse.com  Fact Sheet for Healthcare Providers: SeriousBroker.it  This test is not yet approved or cleared by the [place] FDA and has been authorized for detection and/or diagnosis of SARS-CoV-2 by FDA under an Emergency Use Authorization (EUA). This EUA will remain in effect (meaning this test can be used) for the duration of the COVID-19 declaration under Section 564(b)(1) of the Act, 21 U.S.C. section 360bbb-3(b)(1), unless the authorization is terminated or revoked.     Resp Syncytial Virus by PCR NEGATIVE NEGATIVE Final    Comment: (NOTE) Fact Sheet for Patients: BloggerCourse.com  Fact Sheet for Healthcare Providers: SeriousBroker.it  This test is not yet approved or cleared by the [place] FDA and has been authorized for detection and/or diagnosis of SARS-CoV-2 by FDA under an Emergency Use Authorization (EUA). This EUA will remain in effect (meaning this test can be used) for the duration of the COVID-19 declaration under Section 564(b)(1) of the Act, 21 U.S.C. section 360bbb-3(b)(1), unless the authorization is terminated or revoked.  Performed at [facility], [address] [place], [place] [address]     Procedures and diagnostic studies:  No results found.             LOS: 3 days   Myliyah Rebuck  [facility]   Pager on [email or web address]. If 7PM-7AM, please contact night-coverage at [email or web address]     09/20/2022, 2:18 PM

## 2022-09-20 NOTE — Consult Note (Signed)
PHARMACY CONSULT NOTE - ELECTROLYTES  Pharmacy Consult for Electrolyte Monitoring and Replacement   Recent Labs: Height: 5\' 7"  (170.2 cm) Weight: 61.1 kg (134 lb 12.8 oz) IBW/kg (Calculated) : 66.1 Estimated Creatinine Clearance: 56.6 mL/min (by C-G formula based on SCr of 1.14 mg/dL). Potassium (mmol/L)  Date Value  09/20/2022 3.7   Magnesium (mg/dL)  Date Value  41/32/4401 1.9   Calcium (mg/dL)  Date Value  02/72/5366 8.8 (L)   Albumin (g/dL)  Date Value  44/03/4740 3.2 (L)   Phosphorus (mg/dL)  Date Value  59/56/3875 4.6   Sodium (mmol/L)  Date Value  09/20/2022 122 (L)   Corrected Ca: 9.44 mg/dL  Assessment  Wyatt Montgomery is a 67 y.o. male presenting with chest pain. PMH significant for alcohol use disorder, alcohol-induced chronic pancreatitis, pancreatic cyst, chronic microcytic anemia, chronic thrombocytosis, portal vein thrombosis(on apixaban). Pharmacy has been consulted to monitor and replace electrolytes.  Diet: regular Pertinent medications: furosemide 40mg  IV BID  Goal of Therapy: Electrolytes WNL  Plan:  Na 122: Will defer to primary team regarding plan at this time. No additional supplementation is needed currently. Check BMP with AM labs  Thank you for allowing pharmacy to be a part of this patient's care.  Bettey Costa, PharmD Clinical Pharmacist 09/20/2022 7:56 AM

## 2022-09-21 ENCOUNTER — Other Ambulatory Visit: Payer: Self-pay

## 2022-09-21 LAB — MAGNESIUM: Magnesium: 2.2 mg/dL (ref 1.7–2.4)

## 2022-09-21 LAB — SODIUM: Sodium: 129 mmol/L — ABNORMAL LOW (ref 135–145)

## 2022-09-21 LAB — PHOSPHORUS: Phosphorus: 4.7 mg/dL — ABNORMAL HIGH (ref 2.5–4.6)

## 2022-09-21 LAB — POTASSIUM: Potassium: 4 mmol/L (ref 3.5–5.1)

## 2022-09-21 MED ORDER — AMLODIPINE BESYLATE 5 MG PO TABS
5.0000 mg | ORAL_TABLET | Freq: Every day | ORAL | 0 refills | Status: DC
  Filled 2022-09-21: qty 30, 30d supply, fill #0

## 2022-09-21 MED ORDER — FUROSEMIDE 20 MG PO TABS
20.0000 mg | ORAL_TABLET | Freq: Every day | ORAL | 0 refills | Status: DC
  Filled 2022-09-21: qty 30, 30d supply, fill #0

## 2022-09-21 MED ORDER — FUROSEMIDE 20 MG PO TABS: 20.0000 mg | ORAL_TABLET | Freq: Every day | ORAL | 0 refills | Status: DC

## 2022-09-21 MED ORDER — LOSARTAN POTASSIUM 50 MG PO TABS
50.0000 mg | ORAL_TABLET | Freq: Every day | ORAL | 0 refills | Status: DC
  Filled 2022-09-21: qty 30, 30d supply, fill #0

## 2022-09-21 MED ORDER — LOSARTAN POTASSIUM 50 MG PO TABS: 50.0000 mg | ORAL_TABLET | Freq: Every day | ORAL | 0 refills | Status: DC

## 2022-09-21 MED ORDER — AMLODIPINE BESYLATE 5 MG PO TABS: 5.0000 mg | ORAL_TABLET | Freq: Every day | ORAL | 0 refills | Status: DC

## 2022-09-21 NOTE — Progress Notes (Signed)
Wyatt Montgomery  A and O x 4. VSS. Pt tolerating diet well. No complaints of pain or nausea. IV removed intact, prescriptions given. Pt voiced understanding of discharge instructions with no further questions. Pt discharged via wheelchair with RN    Allergies as of 09/21/2022   No Known Allergies      Medication List     STOP taking these medications    acetaminophen 325 MG tablet Commonly known as: TYLENOL       TAKE these medications    amLODipine 5 MG tablet Commonly known as: NORVASC Take 1 tablet (5 mg total) by mouth daily. Start taking on: September 22, 2022   FeroSul 325 (65 Fe) MG tablet Generic drug: ferrous sulfate Take 1 tablet (325 mg total) by mouth daily.   furosemide 20 MG tablet Commonly known as: Lasix Take 1 tablet (20 mg total) by mouth daily. Start taking on: September 22, 2022   losartan 50 MG tablet Commonly known as: COZAAR Take 1 tablet (50 mg total) by mouth daily. Start taking on: September 22, 2022        Vitals:   09/21/22 0806 09/21/22 1119  BP: (!) 153/83 (!) 145/86  Pulse: 80 90  Resp: 18 18  Temp: 98 F (36.7 C) 97.7 F (36.5 C)  SpO2: 100% 100%    Wyatt Montgomery

## 2022-09-21 NOTE — TOC Transition Note (Signed)
Transition of Care [facility]) - CM/SW Discharge Note   Patient Details  Name: Wyatt Montgomery MRN: [id number] Date of Birth: Aug 16, 1955  [facility] CM/SW Contact:  Truddie Hidden, RN Phone Number: 09/21/2022, 12:51 PM   Clinical Narrative:    Referral sent to [facility].  Referral for HH declined. Spoke with patient at bedside. Patient was advised HH was unable to be arranged due to him being uninsured and his past history. Patient advied to go to [facility] to apply for Medicaid. He stated he would go when they reopen from the holiday. He does not have a ride. [facility] [facility] taxi arranged. [facility] outpatient pharmacy closed. Patient stated he gets his medication from [facility] in [place]. They let him obtain his medications on credit when need arises. Per nurse patient has had dose of medications for today.   TOC signing off.          Patient Goals and CMS Choice      Discharge Placement                         Discharge Plan and Services Additional resources added to the After Visit Summary for                                       Social Determinants of Health (SDOH) Interventions SDOH Screenings   Food Insecurity: Patient Declined (09/18/2022)  Housing: Patient Declined (09/18/2022)  Transportation Needs: No Transportation Needs (09/18/2022)  Utilities: Not At Risk (09/18/2022)  Tobacco Use: High Risk (05/27/2022)     Readmission Risk Interventions     No data to display

## 2022-09-21 NOTE — Discharge Summary (Addendum)
Physician Discharge Summary   Patient: Wyatt Montgomery MRN: [id number] DOB: October 11, 1955  Admit date:     09/17/2022  Discharge date: 09/21/22  Discharge Physician: Lurene Shadow   PCP: Pcp, No   Recommendations at discharge:   Follow-up with PCP in 2 weeks  Discharge Diagnoses: Principal Problem:   Acute heart failure (HCC) Active Problems:   Hyponatremia   Polysubstance abuse (HCC)   Essential hypertension   Thrombosis, portal vein   Iron deficiency anemia   Hypokalemia   Malnutrition of moderate degree  Resolved Problems:   * No resolved hospital problems. *  Hospital Course:  Wyatt Montgomery is a 67 y.o. male  with medical history significant of alcohol use disorder, alcohol induced chronic pancreatitis, pancreatitic cyst, chronic microcytic anemia, chronic thrombocytosis, portal vein thrombosis on Eliquis, who presented to the hospital because of chest pain, cough, shortness of breath and lower extremity swelling.  He has a hide nausea and vomiting the night prior to admission but he attributed this to drinking liquor.   He was admitted to the hospital for acute diastolic heart failure and hypertensive emergency.  Assessment and Plan:   Acute diastolic heart failure: Shortness of breath and leg swelling have improved.  He will be discharged on Lasix 20 mg daily.   BNP 530.6. 2D echo showed EF estimated at 50 to 55%, indeterminate LV diastolic parameters. Follow up with the CHF clinic at discharge.    Chest pain: Troponins only marginally elevated to 24 and 28.  This is likely from demand ischemia.     Hypertensive emergency: BP has improved.  He will be discharged on losartan 50 mg daily and amlodipine 5 mg daily.       Worsening hyponatremia: Sodium improved from 122-129.  S/p treatment with tolvaptan on 09/20/2022.       Hypokalemia: Improved     Chronic Iron deficiency anemia: S/p IV iron sucrose infusion on 09/18/2022 and 09/19/2022. Iron 13, ferritin 12,  saturation ratio 3,  TIBC 384. Continue oral ferrous sulfate at discharge.  Outpatient follow-up with PCP for further management.  Recommend colonoscopy as an outpatient as well.     S/p fall at home: PT recommended home health PT     Polysubstance use disorder (alcohol and cocaine): counseled to quit using alcohol and cocaine  His condition has improved and he is deemed stable for discharge to home today.  The importance of medical adherence and regular follow-up for routine health maintenance was reiterated.         Consultants: None Procedures performed: None  Disposition: Home Diet recommendation:  Discharge Diet Orders (From admission, onward)     Start     Ordered   09/21/22 0000  Diet - low sodium heart healthy        09/21/22 1050           Cardiac diet DISCHARGE MEDICATION: Allergies as of 09/21/2022   No Known Allergies      Medication List     STOP taking these medications    acetaminophen 325 MG tablet Commonly known as: TYLENOL       TAKE these medications    amLODipine 5 MG tablet Commonly known as: NORVASC Take 1 tablet (5 mg total) by mouth daily. Start taking on: September 22, 2022   FeroSul 325 (65 Fe) MG tablet Generic drug: ferrous sulfate Take 1 tablet (325 mg total) by mouth daily.   furosemide 20 MG tablet Commonly known as: Lasix Take 1 tablet (  20 mg total) by mouth daily. Start taking on: September 22, 2022   losartan 50 MG tablet Commonly known as: COZAAR Take 1 tablet (50 mg total) by mouth daily. Start taking on: September 22, 2022        Discharge Exam: Ceasar Mons Weights   09/18/22 1627 09/19/22 0309 09/20/22 0503  Weight: 62.9 kg 61.6 kg 61.1 kg   GEN: NAD SKIN: Warm and dry EYES: EOMI ENT: MMM CV: RRR PULM: CTA B ABD: soft, ND, NT, +BS CNS: AAO x 3, non focal EXT: No edema or tenderness   Condition at discharge: good  The results of significant diagnostics from this hospitalization (including imaging,  microbiology, ancillary and laboratory) are listed below for reference.   Imaging Studies: ECHOCARDIOGRAM COMPLETE  Result Date: 09/18/2022    ECHOCARDIOGRAM REPORT   Patient Name:   Wyatt Montgomery Date of Exam: 09/18/2022 Medical Rec #:  [id number]        Height:       70.0 in Accession #:    [id number]       Weight:       138.7 lb Date of Birth:  December 31, 1955        BSA:          1.787 m Patient Age:    66 years         BP:           167/83 mmHg Patient Gender: M                HR:           87 bpm. Exam Location:  [facility] Procedure: 2D Echo, Cardiac Doppler and Color Doppler Indications:     CHF-acute systolic I50.21  History:         Patient has no prior history of Echocardiogram examinations. No                  cardiac history on file.  Sonographer:     Cristela Blue Referring Phys:  [id number] Verdene Lennert Diagnosing Phys: Julien Nordmann MD IMPRESSIONS  1. Left ventricular ejection fraction, by estimation, is 50 to 55%. The left ventricle has low normal function. The left ventricle has no regional wall motion abnormalities. Left ventricular diastolic parameters are indeterminate.  2. Right ventricular systolic function is normal. The right ventricular size is normal.  3. The mitral valve is normal in structure. Mild mitral valve regurgitation. No evidence of mitral stenosis.  4. The aortic valve is normal in structure. Aortic valve regurgitation is mild. No aortic stenosis is present.  5. The inferior vena cava is normal in size with greater than 50% respiratory variability, suggesting right atrial pressure of 3 mmHg. FINDINGS  Left Ventricle: Left ventricular ejection fraction, by estimation, is 50 to 55%. The left ventricle has low normal function. The left ventricle has no regional wall motion abnormalities. The left ventricular internal cavity size was normal in size. There is no left ventricular hypertrophy. Left ventricular diastolic parameters are indeterminate. Right Ventricle: The right ventricular size  is normal. No increase in right ventricular wall thickness. Right ventricular systolic function is normal. Left Atrium: Left atrial size was normal in size. Right Atrium: Right atrial size was normal in size. Pericardium: There is no evidence of pericardial effusion. Mitral Valve: The mitral valve is normal in structure. Mild mitral valve regurgitation. No evidence of mitral valve stenosis. MV peak gradient, 6.0 mmHg. The mean mitral valve gradient is 3.0 mmHg. Tricuspid Valve: The  tricuspid valve is normal in structure. Tricuspid valve regurgitation is mild . No evidence of tricuspid stenosis. Aortic Valve: The aortic valve is normal in structure. Aortic valve regurgitation is mild. No aortic stenosis is present. Aortic valve mean gradient measures 9.0 mmHg. Aortic valve peak gradient measures 13.7 mmHg. Aortic valve area, by VTI measures 2.32  cm. Pulmonic Valve: The pulmonic valve was normal in structure. Pulmonic valve regurgitation is not visualized. No evidence of pulmonic stenosis. Aorta: The aortic root is normal in size and structure. Venous: The inferior vena cava is normal in size with greater than 50% respiratory variability, suggesting right atrial pressure of 3 mmHg. IAS/Shunts: No atrial level shunt detected by color flow Doppler.  LEFT VENTRICLE PLAX 2D LVIDd:         4.90 cm   Diastology LVIDs:         3.30 cm   LV e' medial:    7.29 cm/s LV PW:         1.10 cm   LV E/e' medial:  16.6 LV IVS:        1.00 cm   LV e' lateral:   13.30 cm/s LVOT diam:     2.30 cm   LV E/e' lateral: 9.1 LV SV:         82 LV SV Index:   46 LVOT Area:     4.15 cm  RIGHT VENTRICLE RV Basal diam:  2.90 cm RV Mid diam:    2.10 cm RV S prime:     14.60 cm/s TAPSE (M-mode): 2.6 cm LEFT ATRIUM             Index        RIGHT ATRIUM           Index LA diam:        2.90 cm 1.62 cm/m   RA Area:     14.50 cm LA Vol (A2C):   63.2 ml 35.37 ml/m  RA Volume:   34.40 ml  19.25 ml/m LA Vol (A4C):   73.7 ml 41.25 ml/m LA Biplane Vol:  69.2 ml 38.73 ml/m  AORTIC VALVE AV Area (Vmax):    2.06 cm AV Area (Vmean):   1.90 cm AV Area (VTI):     2.32 cm AV Vmax:           185.00 cm/s AV Vmean:          143.000 cm/s AV VTI:            0.353 m AV Peak Grad:      13.7 mmHg AV Mean Grad:      9.0 mmHg LVOT Vmax:         91.80 cm/s LVOT Vmean:        65.500 cm/s LVOT VTI:          0.197 m LVOT/AV VTI ratio: 0.56  AORTA Ao Root diam: 3.20 cm MITRAL VALVE                TRICUSPID VALVE MV Area (PHT): 6.27 cm     TR Peak grad:   9.7 mmHg MV Area VTI:   2.64 cm     TR Vmax:        156.00 cm/s MV Peak grad:  6.0 mmHg MV Mean grad:  3.0 mmHg     SHUNTS MV Vmax:       1.22 m/s     Systemic VTI:  0.20 m MV Vmean:      90.7  cm/s    Systemic Diam: 2.30 cm MV Decel Time: 121 msec MV E velocity: 121.00 cm/s MV A velocity: 94.90 cm/s MV E/A ratio:  1.28 Julien Nordmann MD Electronically signed by Julien Nordmann MD Signature Date/Time: 09/18/2022/3:46:49 PM    Final    US Venous Img Lower Bilateral  Result Date: 09/17/2022 CLINICAL DATA:  Bilateral lower extremity pain and edema for the past week, left-greater-than-right. History of smoking. Evaluate for DVT. EXAM: BILATERAL LOWER EXTREMITY VENOUS DOPPLER ULTRASOUND TECHNIQUE: Gray-scale sonography with graded compression, as well as color Doppler and duplex ultrasound were performed to evaluate the lower extremity deep venous systems from the level of the common femoral vein and including the common femoral, femoral, profunda femoral, popliteal and calf veins including the posterior tibial, peroneal and gastrocnemius veins when visible. The superficial great saphenous vein was also interrogated. Spectral Doppler was utilized to evaluate flow at rest and with distal augmentation maneuvers in the common femoral, femoral and popliteal veins. COMPARISON:  Bilateral lower extremity venous Doppler ultrasound-05/27/2022 (negative); left lower extremity venous Doppler ultrasound-03/29/2020 (negative) FINDINGS: RIGHT  LOWER EXTREMITY Common Femoral Vein: No evidence of thrombus. Normal compressibility, respiratory phasicity and response to augmentation. Saphenofemoral Junction: No evidence of thrombus. Normal compressibility and flow on color Doppler imaging. Profunda Femoral Vein: No evidence of thrombus. Normal compressibility and flow on color Doppler imaging. Femoral Vein: No evidence of thrombus. Normal compressibility, respiratory phasicity and response to augmentation. Popliteal Vein: No evidence of thrombus. Normal compressibility, respiratory phasicity and response to augmentation. Calf Veins: No evidence of thrombus. Normal compressibility and flow on color Doppler imaging. Superficial Great Saphenous Vein: No evidence of thrombus. Normal compressibility. Other Findings:  None. LEFT LOWER EXTREMITY Common Femoral Vein: No evidence of thrombus. Normal compressibility, respiratory phasicity and response to augmentation. Saphenofemoral Junction: No evidence of thrombus. Normal compressibility and flow on color Doppler imaging. Profunda Femoral Vein: No evidence of thrombus. Normal compressibility and flow on color Doppler imaging. Femoral Vein: No evidence of thrombus. Normal compressibility, respiratory phasicity and response to augmentation. Popliteal Vein: No evidence of thrombus. Normal compressibility, respiratory phasicity and response to augmentation. Calf Veins: No evidence of thrombus. Normal compressibility and flow on color Doppler imaging. Superficial Great Saphenous Vein: No evidence of thrombus. Normal compressibility. Other Findings: There is a minimal to moderate amount of subcutaneous edema at the level of the left lower leg and calf. IMPRESSION: No evidence of DVT within either lower extremity. Electronically Signed   By: Simonne Come M.D.   On: 09/17/2022 12:04   DG Chest 2 View  Result Date: 09/17/2022 CLINICAL DATA:  Chest pain with bilateral ankle swelling for 2 days. EXAM: CHEST - 2 VIEW  COMPARISON:  Radiographs 05/11/2022 and 05/05/2022. FINDINGS: The heart size is stable at the upper limits of normal. The mediastinal contours are normal. Interval increased interstitial and visual thickening suspicious for mild pulmonary edema. No confluent airspace disease, pleural effusion or pneumothorax. The bones appear unremarkable. Telemetry leads overlie the chest. IMPRESSION: Borderline heart size with interval increased interstitial and vascular thickening suspicious for mild pulmonary edema. Electronically Signed   By: Carey Bullocks M.D.   On: 09/17/2022 11:46    Microbiology: Results for orders placed or performed during the hospital encounter of 09/17/22  Resp panel by RT-PCR (RSV, Flu A&B, Covid) Anterior Nasal Swab     Status: None   Collection Time: 09/17/22 11:10 AM   Specimen: Anterior Nasal Swab  Result Value Ref Range Status   SARS Coronavirus 2  by RT PCR NEGATIVE NEGATIVE Final    Comment: (NOTE) SARS-CoV-2 target nucleic acids are NOT DETECTED.  The SARS-CoV-2 RNA is generally detectable in upper respiratory specimens during the acute phase of infection. The lowest concentration of SARS-CoV-2 viral copies this assay can detect is 138 copies/mL. A negative result does not preclude SARS-Cov-2 infection and should not be used as the sole basis for treatment or other patient management decisions. A negative result may occur with  improper specimen collection/handling, submission of specimen other than nasopharyngeal swab, presence of viral mutation(s) within the areas targeted by this assay, and inadequate number of viral copies(<138 copies/mL). A negative result must be combined with clinical observations, patient history, and epidemiological information. The expected result is Negative.  Fact Sheet for Patients:  BloggerCourse.com  Fact Sheet for Healthcare Providers:  SeriousBroker.it  This test is no t yet  approved or cleared by the [place] FDA and  has been authorized for detection and/or diagnosis of SARS-CoV-2 by FDA under an Emergency Use Authorization (EUA). This EUA will remain  in effect (meaning this test can be used) for the duration of the COVID-19 declaration under Section 564(b)(1) of the Act, 21 U.S.C.section 360bbb-3(b)(1), unless the authorization is terminated  or revoked sooner.       Influenza A by PCR NEGATIVE NEGATIVE Final   Influenza B by PCR NEGATIVE NEGATIVE Final    Comment: (NOTE) The Xpert Xpress SARS-CoV-2/FLU/RSV plus assay is intended as an aid in the diagnosis of influenza from Nasopharyngeal swab specimens and should not be used as a sole basis for treatment. Nasal washings and aspirates are unacceptable for Xpert Xpress SARS-CoV-2/FLU/RSV testing.  Fact Sheet for Patients: BloggerCourse.com  Fact Sheet for Healthcare Providers: SeriousBroker.it  This test is not yet approved or cleared by the [place] FDA and has been authorized for detection and/or diagnosis of SARS-CoV-2 by FDA under an Emergency Use Authorization (EUA). This EUA will remain in effect (meaning this test can be used) for the duration of the COVID-19 declaration under Section 564(b)(1) of the Act, 21 U.S.C. section 360bbb-3(b)(1), unless the authorization is terminated or revoked.     Resp Syncytial Virus by PCR NEGATIVE NEGATIVE Final    Comment: (NOTE) Fact Sheet for Patients: BloggerCourse.com  Fact Sheet for Healthcare Providers: SeriousBroker.it  This test is not yet approved or cleared by the [place] FDA and has been authorized for detection and/or diagnosis of SARS-CoV-2 by FDA under an Emergency Use Authorization (EUA). This EUA will remain in effect (meaning this test can be used) for the duration of the COVID-19 declaration under Section 564(b)(1) of  the Act, 21 U.S.C. section 360bbb-3(b)(1), unless the authorization is terminated or revoked.  Performed at [facility], [address] [place], [place] [address]     Labs: CBC: Recent Labs  Lab 09/17/22 1048 09/19/22 0520 09/20/22 0446  WBC 5.0 6.3 8.8  HGB 7.8* 7.9* 7.7*  HCT 25.0* 24.2* 23.9*  MCV 69.6* 68.2* 69.7*  PLT 415* 469* 464*   Basic Metabolic Panel: Recent Labs  Lab 09/17/22 1048 09/17/22 1838 09/18/22 0322 09/19/22 0520 09/20/22 0446 09/20/22 1747 09/21/22 0201  NA 120* 120* 127* 127* 122* 128* 129*  K 3.4* 3.3* 3.8 4.0 3.7  --  4.0  CL 89* 91* 95* 96* 92*  --   --   CO2 23 22 23 22 22   --   --   GLUCOSE 99 122* 109* 63* 84  --   --  BUN 13 13 12 12 14   --   --   CREATININE 1.12 1.21 1.12 1.06 1.14  --   --   CALCIUM 9.0 8.7* 9.2 9.2 8.8*  --   --   MG  --   --  2.2 2.2 1.9  --  2.2  PHOS  --   --  3.5 3.3 4.6  --  4.7*   Liver Function Tests: Recent Labs  Lab 09/17/22 1306 09/20/22 0446  AST 105* 38  ALT 44 27  ALKPHOS 153* 114  BILITOT 0.4 0.4  PROT 7.4 6.6  ALBUMIN 3.2* 2.8*   CBG: No results for input(s): "GLUCAP" in the last 168 hours.  Discharge time spent: greater than 30 minutes.  Signed: Lurene Shadow, MD [facility] 09/21/2022

## 2022-09-21 NOTE — Consult Note (Signed)
PHARMACY CONSULT NOTE - ELECTROLYTES  Pharmacy Consult for Electrolyte Monitoring and Replacement   Recent Labs: Height: 5\' 7"  (170.2 cm) Weight: 61.1 kg (134 lb 12.8 oz) IBW/kg (Calculated) : 66.1 Estimated Creatinine Clearance: 56.6 mL/min (by C-G formula based on SCr of 1.14 mg/dL). Potassium (mmol/L)  Date Value  09/21/2022 4.0   Magnesium (mg/dL)  Date Value  16/10/9602 2.2   Calcium (mg/dL)  Date Value  54/09/8117 8.8 (L)   Albumin (g/dL)  Date Value  14/78/2956 2.8 (L)   Phosphorus (mg/dL)  Date Value  21/30/8657 4.7 (H)   Sodium (mmol/L)  Date Value  09/21/2022 129 (L)   Assessment  Wyatt Montgomery is a 67 y.o. male presenting with chest pain. PMH significant for alcohol use disorder, alcohol-induced chronic pancreatitis, pancreatic cyst, chronic microcytic anemia, chronic thrombocytosis, portal vein thrombosis(on apixaban). Pharmacy has been consulted to monitor and replace electrolytes.  Diet: Regular, 2L fluid restriction Pertinent medications: Tolvaptan 15 mg PO 9/1  Goal of Therapy: Electrolytes WNL  Plan:  Na 129, appropriate response to tolvaptan administered yesterday. Further management per nephrology No replacement indicated at this time Check BMP with AM labs tomorrow  Thank you for allowing pharmacy to be a part of this patient's care.  Tressie Ellis 09/21/2022 10:42 AM

## 2022-09-28 ENCOUNTER — Telehealth (HOSPITAL_COMMUNITY): Payer: Self-pay | Admitting: Licensed Clinical Social Worker

## 2022-09-28 NOTE — Telephone Encounter (Signed)
CSW consulted to help with transport to appt tomorrow.  Unable to reach or leave VM.  Will continue to attempt  Burna Sis, LCSW Clinical Social Worker [facility] Desk#: [phone] Cell#: [phone]

## 2022-09-28 NOTE — Telephone Encounter (Signed)
H&V Care Navigation CSW Progress Note  Clinical Social Worker consulted to help with transportation to patient to appointment tomorrow.  CSW called pt who is currently staying at a motel [facility]) and confirmed address and pick up time for 9:45am by [employer].  Phone number for Martie Round is [phone] if he is not reachable on his cellphone.   SDOH Screenings   Food Insecurity: Patient Declined (09/18/2022)  Housing: Patient Declined (09/18/2022)  Transportation Needs: Unmet Transportation Needs (09/28/2022)  Utilities: Not At Risk (09/18/2022)  Tobacco Use: High Risk (05/27/2022)   Burna Sis, LCSW Clinical Social Worker [facility] Desk#: [phone] Cell#: [phone]

## 2022-09-29 ENCOUNTER — Ambulatory Visit (HOSPITAL_BASED_OUTPATIENT_CLINIC_OR_DEPARTMENT_OTHER): Payer: Self-pay | Admitting: Family

## 2022-09-29 ENCOUNTER — Other Ambulatory Visit
Admission: RE | Admit: 2022-09-29 | Discharge: 2022-09-29 | Disposition: A | Discharge status: Home / Self Care | Payer: Self-pay | Source: Ambulatory Visit | Attending: Family | Admitting: Family

## 2022-09-29 ENCOUNTER — Encounter: Payer: Self-pay | Admitting: Family

## 2022-09-29 ENCOUNTER — Encounter: Payer: Self-pay | Admitting: Hematology and Oncology

## 2022-09-29 ENCOUNTER — Other Ambulatory Visit: Payer: Self-pay

## 2022-09-29 ENCOUNTER — Other Ambulatory Visit: Payer: Self-pay | Admitting: Family

## 2022-09-29 VITALS — BP 175/95 | HR 80 | Resp 14 | Wt 146.4 lb

## 2022-09-29 DIAGNOSIS — I5032 Chronic diastolic (congestive) heart failure: Secondary | ICD-10-CM | POA: Insufficient documentation

## 2022-09-29 DIAGNOSIS — Z789 Other specified health status: Secondary | ICD-10-CM

## 2022-09-29 DIAGNOSIS — D509 Iron deficiency anemia, unspecified: Secondary | ICD-10-CM

## 2022-09-29 DIAGNOSIS — I10 Essential (primary) hypertension: Secondary | ICD-10-CM

## 2022-09-29 LAB — BASIC METABOLIC PANEL
Anion gap: 5 (ref 5–15)
BUN: 19 mg/dL (ref 8–23)
CO2: 24 mmol/L (ref 22–32)
Calcium: 9.5 mg/dL (ref 8.9–10.3)
Chloride: 101 mmol/L (ref 98–111)
Creatinine, Ser: 1.09 mg/dL (ref 0.61–1.24)
GFR, Estimated: >60 mL/min (ref >60)
Glucose, Bld: 87 mg/dL (ref 70–99)
Potassium: 4.5 mmol/L (ref 3.5–5.1)
Sodium: 130 mmol/L — ABNORMAL LOW (ref 135–145)

## 2022-09-29 LAB — CBC
HCT: 29.3 % — ABNORMAL LOW (ref 39.0–52.0)
Hemoglobin: 8.9 g/dL — ABNORMAL LOW (ref 13.0–17.0)
MCH: 22.4 pg — ABNORMAL LOW (ref 26.0–34.0)
MCHC: 30.4 g/dL (ref 30.0–36.0)
MCV: 73.6 fL — ABNORMAL LOW (ref 80.0–100.0)
Platelets: 442 10*3/uL — ABNORMAL HIGH (ref 150–400)
RBC: 3.98 MIL/uL — ABNORMAL LOW (ref 4.22–5.81)
RDW: 20.4 % — ABNORMAL HIGH (ref 11.5–15.5)
WBC: 10.3 10*3/uL (ref 4.0–10.5)
nRBC: 0 % (ref 0.0–0.2)

## 2022-09-29 MED ORDER — FERROUS SULFATE 325 (65 FE) MG PO TABS
325.0000 mg | ORAL_TABLET | Freq: Every day | ORAL | 3 refills | Status: DC
  Filled 2022-09-29: qty 30, 30d supply, fill #0
  Filled 2022-11-03: qty 30, 30d supply, fill #1

## 2022-09-29 MED ORDER — VALSARTAN 80 MG PO TABS
80.0000 mg | ORAL_TABLET | Freq: Every day | ORAL | 3 refills | Status: DC
  Filled 2022-09-29: qty 90, 90d supply, fill #0

## 2022-09-29 MED ORDER — POTASSIUM CHLORIDE CRYS ER 20 MEQ PO TBCR
20.0000 meq | EXTENDED_RELEASE_TABLET | Freq: Two times a day (BID) | ORAL | 3 refills | Status: DC
  Filled 2022-09-29: qty 90, 45d supply, fill #0

## 2022-09-29 MED ORDER — AMLODIPINE BESYLATE 5 MG PO TABS
5.0000 mg | ORAL_TABLET | Freq: Every day | ORAL | 3 refills | Status: DC
  Filled 2022-09-29: qty 90, 90d supply, fill #0

## 2022-09-29 MED ORDER — POTASSIUM CHLORIDE CRYS ER 20 MEQ PO TBCR
20.0000 meq | EXTENDED_RELEASE_TABLET | Freq: Every day | ORAL | 3 refills | Status: DC
  Filled 2022-09-29: qty 90, 90d supply, fill #0

## 2022-09-29 MED ORDER — FUROSEMIDE 20 MG PO TABS
20.0000 mg | ORAL_TABLET | Freq: Every day | ORAL | 5 refills | Status: DC
  Filled 2022-09-29: qty 30, 30d supply, fill #0
  Filled 2022-11-03: qty 30, 30d supply, fill #1

## 2022-09-29 NOTE — Progress Notes (Signed)
Advanced Heart Failure Clinic Note   Referring Physician: hospitalist PCP: Pcp, No Cardiologist: None   HPI:  Wyatt Montgomery is a 67 y/o male with a history of hyponatremia, polysubstance abuse, HTN, portal vein thrombosis, IDA, hypokalemia, alcohol use, chronic pancreatitis, pancreatitic cyst, chronic thrombocytosis and chronic heart failure.   Admitted 09/17/22 due to chest pain, cough, shortness of breath and lower extremity swelling. He had nausea and vomiting the night prior to admission but he attributed this to drinking liquor. Diuresed. Treated with tolvaptan due to hyponatremia. S/p IV iron sucrose infusion on 09/18/2022 and 09/19/2022 due to anemia.   Echo 09/18/22: EF 50-55% with mild Wyatt  He presents today for his initial visit with a chief complaint of minimal fatigue with moderate exertion. Chronic in nature. Has associated cough, swelling in lower ankles and chronic difficulty sleeping along with this. Denies chest pain, palpitations, abdominal distention or dizziness.    Currently not taking any medications as he has no insurance and no income. Is smoking 1 cigarette daily. Drinking 2-3 cans 12-16 oz beer daily.    Review of Systems: [y] = yes, [ ]  = no   General: Weight gain [ ] ; Weight loss [ ] ; Anorexia [ ] ; Fat[facility] ]; Fever [ ] ; Chills [ ] ; Weakness [ ]   Cardiac: Chest pain/pressure [ ] ; Resting SOB [ ] ; Exertional SOB [ ] ; Orthopnea [ ] ; Pedal Edema [facility] ]; Palpitations [ ] ; Syncope [ ] ; Presyncope [ ] ; Paroxysmal nocturnal dyspnea[ ]   Pulmonary: Cough [ y]; [facility] ]; Hemoptysis[ ] ; Sputum [ ] ; Snoring [ ]   GI: Vomiting[ ] ; Dysphagia[ ] ; Melena[ ] ; Hematochezia [ ] ; Heartburn[ ] ; Abdominal pain [ ] ; Constipation [ ] ; Diarrhea [ ] ; BRBPR [ ]   GU: Hematuria[ ] ; Dysuria [ ] ; Nocturia[ ]   Vascular: Pain in legs with walking [ ] ; Pain in feet with lying flat [ ] ; Non-healing sores [ ] ; Stroke [ ] ; TIA [ ] ; Slurred speech [ ] ;  Neuro: Headaches[ ] ; Vertigo[ ] ; Seizures[ ] ;  Paresthesias[ ] ;Blurred vision [ ] ; Diplopia [ ] ; Vision changes [ ]   Ortho/Skin: Arthritis [ ] ; Joint pain [ ] ; Muscle pain [ ] ; Joint swelling [ ] ; Back Pain [ ] ; Rash [ ]   Psych: Depression[ ] ; Anxiety[ ]   Heme: Bleeding problems [ ] ; Clotting disorders [ ] ; Anemia [ ]   Endocrine: Diabetes [ ] ; Thyroid dysfunction[ ]    Past Medical History:  Diagnosis Date   Pancreatic mass    Thrombosis, portal vein     Current Outpatient Medications  Medication Sig Dispense Refill   amLODipine (NORVASC) 5 MG tablet Take 1 tablet (5 mg total) by mouth daily. 30 tablet 0   ferrous sulfate 325 (65 FE) MG tablet Take 1 tablet (325 mg total) by mouth daily. (Patient not taking: Reported on 09/17/2022) 30 tablet 3   furosemide (LASIX) 20 MG tablet Take 1 tablet (20 mg total) by mouth daily. 30 tablet 0   losartan (COZAAR) 50 MG tablet Take 1 tablet (50 mg total) by mouth daily. 30 tablet 0   No current facility-administered medications for this visit.    No Known Allergies    Social History   Socioeconomic History   Marital status: Married    Spouse name: Not on file   Number of children: 1   Years of education: Not on file   Highest education level: Not on file  Occupational History   Not on file  Tobacco Use   Smoking status: Heavy Smoker  Current packs/day: 0.50    Average packs/day: 0.5 packs/day for 20.0 years (10.0 ttl pk-yrs)    Types: Cigarettes   Smokeless tobacco: Never   Tobacco comments:    decreased, 1/2 pack per week.  Vaping Use   Vaping status: Never Used  Substance and Sexual Activity   Alcohol use: Yes    Alcohol/week: 28.0 standard drinks of alcohol    Types: 28 Cans of beer per week   Drug use: Yes    Frequency: 1.0 times per week    Types: "Crack" cocaine    Comment: used 4 days prior to admission   Sexual activity: Not Currently    Partners: Female  Other Topics Concern   Not on file  Social History Narrative   Not on file   Social Determinants of  Health   Financial Resource Strain: Not on file  Food Insecurity: Patient Declined (09/18/2022)   Hunger Vital Sign    Worried About Running Out of Food in the Last Year: Patient declined    Ran Out of Food in the Last Year: Patient declined  Transportation Needs: Unmet Transportation Needs (09/28/2022)   PRAPARE - Administrator, Civil Service (Medical): Yes    Lack of Transportation (Non-Medical): No  Physical Activity: Not on file  Stress: Not on file  Social Connections: Not on file  Intimate Partner Violence: Not At Risk (09/18/2022)   Humiliation, Afraid, Rape, and Kick questionnaire    Fear of Current or Ex-Partner: No    Emotionally Abused: No    Physically Abused: No    Sexually Abused: No      Family History  Problem Relation Age of Onset   Hypertension Father    Prior to Admission medications   Medication Sig Start Date End Date Taking? Authorizing Provider  amLODipine (NORVASC) 5 MG tablet Take 1 tablet (5 mg total) by mouth daily. Patient not taking: Reported on 09/29/2022 09/29/22 12/28/22 Yes Clarisa Kindred A, FNP  potassium chloride SA (KLOR-CON M) 20 MEQ tablet Take 1 tablet (20 mEq total) by mouth 2 (two) times daily. 09/29/22  Yes Margeaux Swantek, Inetta Fermo A, FNP  amLODipine (NORVASC) 5 MG tablet Take 1 tablet (5 mg total) by mouth daily. Patient not taking: Reported on 09/29/2022 09/22/22   Lurene Shadow, MD  ferrous sulfate 325 (65 FE) MG tablet Take 1 tablet (325 mg total) by mouth daily. Patient not taking: Reported on 09/17/2022 05/07/22   Tresa Moore, MD  furosemide (LASIX) 20 MG tablet Take 1 tablet (20 mg total) by mouth daily. Patient not taking: Reported on 09/29/2022 09/29/22 09/29/23  Delma Freeze, FNP   PHYSICAL EXAM: General:  Well appearing. No respiratory difficulty HEENT: normal Neck: supple. no JVD. No lymphadenopathy or thyromegaly appreciated. Cor: PMI nondisplaced. Regular rate & rhythm. No rubs, gallops or murmurs. Lungs: expiratory  wheezing throughout  Abdomen: soft, nontender, nondistended. No hepatosplenomegaly. No bruits or masses.  Extremities: no cyanosis, clubbing, rash, 1+ pitting edema with L>R Neuro: alert & oriented x 3, cranial nerves grossly intact. moves all 4 extremities w/o difficulty. Affect pleasant.  ECG: 09/17/22 NSR with LVH  ReDs: 30%   ASSESSMENT & PLAN:  1: Chronic heart failure with preserved ejection fraction- - suspect due to alcohol use - NYHA class II - euvolemic - unable to weigh as he doesn't have scales and can't afford them - Echo 09/18/22: EF 50-55% with mild Wyatt - begin furosemide 20mg  daily - begin potassium daily - begin valsartan  80mg  daily - BMP next visit - BNP 09/17/22 was 530.6  2: HTN- - BP 175/95; med changes above - has not been taking any meds since discharge because he can't afford them - begin amlodipine 5mg  daily - needs PCP but transportation is an issue - BMP 09/20/22 showed sodium 122, potassium 3.7, creatinine 1.14 & GFR >60 - BMP today  3: Alcohol use- - drinks 2-3 cans of 12-16oz beers daily - discussed the importance of not drinking any alcohol  4: Anemia- - saw hematology 12/21 - hemoglobin 09/20/22 was 7.7 - ferrous sulfate RX refilled today - CBC today  5: Hyponatremia- - sodium 120 on admission 09/17/22, treated with tolvaptan - sodium 122 on 9/1 - sodium 129 on 9/2 - BMP today  Have sent RX to [facility] where they will get him set up with Medication Management since he has no insurance. [facility] Courtesy car will take him from main campus to [facility] and then pharmacy will call [employer] to pick him up from there. Food resources provided today  Return in 2 weeks, sooner if needed.    Delma Freeze, FNP 09/29/22

## 2022-09-29 NOTE — Patient Instructions (Addendum)
DISCONTINUE LOSARTAN  START VALSARTAN 80 MG ONCE DAILY  START FUROSEMIDE 20 MG ONCE DAILY  START AMLODIPINE 5 MG ONCE DAILY  [facility] your [facility] work (Have the volunteers get you a courtesy car to take you to the pharmacy)  Go to the [facility] to get your medications (Have the pharmacy call your taxi for pick up. [phone]. We've already spoken with them.)  Follow up with Inetta Fermo in 2 weeks

## 2022-09-30 ENCOUNTER — Telehealth (HOSPITAL_COMMUNITY): Payer: Self-pay | Admitting: Licensed Clinical Social Worker

## 2022-09-30 NOTE — Telephone Encounter (Signed)
Pt called CSW back.  Now reporting he does get social security income of about $924 a month.  CSW encouraged pt to apply for Medicaid and call [employer] in regardings to Medicare.  He will plan to go to [facility] Monday to apply for Medicaid and call [facility] for guidance on finding best plan for him whne he is eligible for Medicare in November.  CSW will continue to follow and assist as needed- pt will plan to call CSW back with updates  Burna Sis, LCSW Clinical Social Worker Advanced [facility] Failure Clinic Desk#: [phone] Cell#: [phone]

## 2022-09-30 NOTE — Telephone Encounter (Signed)
H&V Care Navigation CSW Progress Note  Clinical Social Worker reached out to pt to discuss lack of PCP and insurance.  Pt lives in a motel- states he is married but wife is in a SNF.  States he has no income and that he has a friend who pays for the hotel currently.  Pt agreeable to CSW sending him application for [facility] to see a PCP.  Started to talk about being eligible for Medicare since he turns 67 in November but then pt reported he needed to call me back- awaiting return call.   SDOH Screenings   Food Insecurity: Food Insecurity Present (09/29/2022)  Housing: Patient Declined (09/29/2022)  Transportation Needs: Unmet Transportation Needs (09/29/2022)  Utilities: Not At Risk (09/18/2022)  Financial Resource Strain: High Risk (09/29/2022)  Tobacco Use: High Risk (09/29/2022)   Burna Sis, LCSW Clinical Social Worker Advanced [facility] Failure Clinic Desk#: [phone] Cell#: [phone]

## 2022-10-09 ENCOUNTER — Telehealth (HOSPITAL_COMMUNITY): Payer: Self-pay | Admitting: Licensed Clinical Social Worker

## 2022-10-09 NOTE — Telephone Encounter (Signed)
CSW received referral to assist patient with transportation to appointment next week. CSW attempted to contact patient to arrange although unable to reach patient or leave a message. Will attempt again. Lasandra Beech, LCSW, CCSW-MCS [phone]

## 2022-10-13 ENCOUNTER — Telehealth (HOSPITAL_COMMUNITY): Payer: Self-pay | Admitting: Licensed Clinical Social Worker

## 2022-10-13 NOTE — Telephone Encounter (Signed)
CSW received referral to assist patient with transportation to appointment on Thursday at [facility]. CSW attempted multiple times to reach patient with no success. CSW also contacted patient's friend listed in chart who states she no longer has any contact with patient. CSW unable to arrange transportation at this time. If patient should call, please refer to CSW for assistance. Lasandra Beech, LCSW, CCSW-MCS [phone]

## 2022-10-14 ENCOUNTER — Telehealth (HOSPITAL_COMMUNITY): Payer: Self-pay | Admitting: Licensed Clinical Social Worker

## 2022-10-14 NOTE — Telephone Encounter (Signed)
CSW continues to be unsuccessful in reaching patient to set up transportation for tomorrows appointment. CSW notified staff at the clinic to call if patient reaches out regarding transportation. Lasandra Beech, LCSW, CCSW-MCS [phone]

## 2022-10-15 ENCOUNTER — Encounter: Payer: Self-pay | Admitting: Family

## 2022-10-15 ENCOUNTER — Telehealth (HOSPITAL_COMMUNITY): Payer: Self-pay | Admitting: Licensed Clinical Social Worker

## 2022-10-15 NOTE — Telephone Encounter (Signed)
CSW attempted to call pt and pt emergency contact again regarding transportation assistance with no success.  Unable to leave VM.  Burna Sis, LCSW Clinical Social Worker Advanced [facility] Clinic Desk#: [phone] Cell#: [phone]

## 2022-10-15 NOTE — Progress Notes (Deleted)
Advanced Heart Failure Clinic Note    PCP: Pcp, No Cardiologist: None   HPI:  Mr Wyatt Montgomery is a 67 y/o male with a history of hyponatremia, polysubstance abuse, HTN, portal vein thrombosis, IDA, hypokalemia, alcohol use, chronic pancreatitis, pancreatitic cyst, chronic thrombocytosis and chronic heart failure.   Admitted 09/17/22 due to chest pain, cough, shortness of breath and lower extremity swelling. He had nausea and vomiting the night prior to admission but he attributed this to drinking liquor. Diuresed. Treated with tolvaptan due to hyponatremia. S/p IV iron sucrose infusion on 09/18/2022 and 09/19/2022 due to anemia.   Echo 09/18/22: EF 50-55% with mild MR  He presents today for his initial visit with a chief complaint of minimal fatigue with moderate exertion. Chronic in nature. Has associated cough, swelling in lower ankles and chronic difficulty sleeping along with this. Denies chest pain, palpitations, abdominal distention or dizziness.    Currently not taking any medications as he has no insurance and no income. Is smoking 1 cigarette daily. Drinking 2-3 cans 12-16 oz beer daily.      ROS: All systems negative except as listed in HPI, PMH and Problem List.  SH:  Social History   Socioeconomic History   Marital status: Married    Spouse name: Not on file   Number of children: 1   Years of education: Not on file   Highest education level: Not on file  Occupational History   Not on file  Tobacco Use   Smoking status: Heavy Smoker    Current packs/day: 0.50    Average packs/day: 0.5 packs/day for 20.0 years (10.0 ttl pk-yrs)    Types: Cigarettes   Smokeless tobacco: Never   Tobacco comments:    decreased, 1/2 pack per week.  Vaping Use   Vaping status: Never Used  Substance and Sexual Activity   Alcohol use: Yes    Alcohol/week: 28.0 standard drinks of alcohol    Types: 28 Cans of beer per week   Drug use: Yes    Frequency: 1.0 times per week    Types:  "Crack" cocaine    Comment: used 4 days prior to admission   Sexual activity: Not Currently    Partners: Female  Other Topics Concern   Not on file  Social History Narrative   Not on file   Social Determinants of Health   Financial Resource Strain: High Risk (09/29/2022)   Overall Financial Resource Strain (CARDIA)    Difficulty of Paying Living Expenses: Very hard  Food Insecurity: Food Insecurity Present (09/29/2022)   Hunger Vital Sign    Worried About Programme researcher, broadcasting/film/video in the Last Year: Often true    Ran Out of Food in the Last Year: Often true  Transportation Needs: Unmet Transportation Needs (09/29/2022)   PRAPARE - Administrator, Civil Service (Medical): Yes    Lack of Transportation (Non-Medical): Yes  Physical Activity: Not on file  Stress: Not on file  Social Connections: Not on file  Intimate Partner Violence: Not At Risk (09/18/2022)   Humiliation, Afraid, Rape, and Kick questionnaire    Fear of Current or Ex-Partner: No    Emotionally Abused: No    Physically Abused: No    Sexually Abused: No    FH:  Family History  Problem Relation Age of Onset   Hypertension Father     Past Medical History:  Diagnosis Date   Pancreatic mass    Thrombosis, portal vein  Current Outpatient Medications  Medication Sig Dispense Refill   amLODipine (NORVASC) 5 MG tablet Take 1 tablet (5 mg total) by mouth daily. (Patient not taking: Reported on 09/29/2022) 30 tablet 0   amLODipine (NORVASC) 5 MG tablet Take 1 tablet (5 mg total) by mouth daily. 90 tablet 3   ferrous sulfate 325 (65 FE) MG tablet Take 1 tablet (325 mg total) by mouth daily. 30 tablet 3   furosemide (LASIX) 20 MG tablet Take 1 tablet (20 mg total) by mouth daily. 30 tablet 5   valsartan (DIOVAN) 80 MG tablet Take 1 tablet (80 mg total) by mouth daily. 90 tablet 3   No current facility-administered medications for this visit.      PHYSICAL EXAM:  General:  Well appearing. No resp  difficulty HEENT: normal Neck: supple. JVP flat. Carotids 2+ bilaterally; no bruits. No lymphadenopathy or thryomegaly appreciated. Cor: PMI normal. Regular rate & rhythm. No rubs, gallops or murmurs. Lungs: clear Abdomen: soft, nontender, nondistended. No hepatosplenomegaly. No bruits or masses. Good bowel sounds. Extremities: no cyanosis, clubbing, rash, edema Neuro: alert & orientedx3, cranial nerves grossly intact. Moves all 4 extremities w/o difficulty. Affect pleasant.   ECG:   ASSESSMENT & PLAN:  1: Chronic heart failure with preserved ejection fraction- - suspect due to alcohol use - NYHA class II - euvolemic - unable to weigh as he doesn't have scales and can't afford them - Echo 09/18/22: EF 50-55% with mild MR - begin furosemide 20mg  daily - begin potassium daily - begin valsartan 80mg  daily - BMP next visit - BNP 09/17/22 was 530.6  2: HTN- - BP 175/95; med changes above - has not been taking any meds since discharge because he can't afford them - begin amlodipine 5mg  daily - needs PCP but transportation is an issue - BMP 09/20/22 showed sodium 122, potassium 3.7, creatinine 1.14 & GFR >60 - BMP today  3: Alcohol use- - drinks 2-3 cans of 12-16oz beers daily - discussed the importance of not drinking any alcohol  4: Anemia- - saw hematology 12/21 - hemoglobin 09/20/22 was 7.7 - ferrous sulfate RX refilled today - CBC today  5: Hyponatremia- - sodium 120 on admission 09/17/22, treated with tolvaptan - sodium 122 on 9/1 - sodium 129 on 9/2 - BMP today  Have sent RX to [facility] where they will get him set up with Medication Management since he has no insurance. [facility] car will take him from main campus to [facility] and then pharmacy will call [employer] to pick him up from there. Food resources provided today  Return in 2 weeks, sooner if needed.    Delma Freeze, FNP 09/29/22

## 2022-10-16 ENCOUNTER — Telehealth (HOSPITAL_COMMUNITY): Payer: Self-pay | Admitting: Licensed Clinical Social Worker

## 2022-10-16 ENCOUNTER — Encounter: Payer: Self-pay | Admitting: Family

## 2022-10-16 NOTE — Telephone Encounter (Signed)
H&V Care Navigation CSW Progress Note  Clinical Social Worker consulted to help arrange transport for patient's Monday appt- able to arrange [employer]- pick up from home scheduled for 12:30pm   SDOH Screenings   Food Insecurity: Food Insecurity Present (09/29/2022)  Housing: Patient Declined (09/29/2022)  Transportation Needs: Unmet Transportation Needs (09/29/2022)  Utilities: Not At Risk (09/18/2022)  Financial Resource Strain: High Risk (09/29/2022)  Tobacco Use: High Risk (09/29/2022)   10/16/2022  Wyatt Montgomery DOB: [birth year] MRN: [id number]   RIDER WAIVER AND RELEASE OF LIABILITY  For the purposes of helping with transportation needs, [facility] partners with outside transportation providers (taxi companies, [place], Catering manager.) to give [employer] patients or other approved people the choice of on-demand rides [employer]") to our buildings for non-emergency visits.  By using [employer], I, the person signing this document, on behalf of myself and/or any legal minors (in my care using the [employer]), agree:  Science writer given to me are supplied by independent, outside transportation providers who do not work for, or have any affiliation with, [employer]. [place] is not a transportation company. [facility] has no control over the quality or safety of the rides I get using [employer]. [facility] has no control over whether any outside ride will happen on time or not. [facility] gives no guarantee on the reliability, quality, safety, or availability on any rides, or that no mistakes will happen. I know and accept that traveling by vehicle (car, truck, [employer], Zenaida Niece, bus, taxi, etc.) has risks of serious injuries such as disability, being paralyzed, and death. I know and agree the risk of using [employer] is mine alone, and not [employer]. Transport Services are provided "as is" and as are available. The transportation providers are  in charge for all inspections and care of the vehicles used to provide these rides. I agree not to take legal action against [facility], its agents, employees, officers, directors, representatives, insurers, attorneys, assigns, successors, subsidiaries, and affiliates at any time for any reasons related directly or indirectly to using [employer]. I also agree not to take legal action against [facility] or its affiliates for any injury, death, or damage to property caused by or related to using [employer]. I have read this Waiver and Release of Liability, and I understand the terms used in it and their legal meaning. This Waiver is freely and voluntarily given with the understanding that my right (or any legal minors) to legal action against [place] relating to [employer] is knowingly given up to use these services.   I attest that I read the Ride Waiver and Release of Liability to Wyatt Montgomery, gave Mr. Folz the opportunity to ask questions and answered the questions asked (if any). I affirm that KAEGAN HETTICH then provided consent for assistance with transportation.     Burna Sis

## 2022-10-16 NOTE — Progress Notes (Unsigned)
Advanced Heart Failure Clinic Note    PCP: Pcp, No Cardiologist: None   HPI:  Mr Wyatt Montgomery is a 67 y/o male with a history of hyponatremia, polysubstance abuse, HTN, portal vein thrombosis, IDA, hypokalemia, alcohol use, chronic pancreatitis, pancreatitic cyst, chronic thrombocytosis and chronic heart failure.   Admitted 09/17/22 due to chest pain, cough, shortness of breath and lower extremity swelling. He had nausea and vomiting the night prior to admission but he attributed this to drinking liquor. Diuresed. Treated with tolvaptan due to hyponatremia. S/p IV iron sucrose infusion on 09/18/2022 and 09/19/2022 due to anemia.   Echo 09/18/22: EF 50-55% with mild MR  He presents today for a HF follow-up visit with a chief complaint of minimal fatigue with moderate exertion. Chronic in nature. Has associated minimal left ankle swelling. Sleeping well on 2 pillows. Denies shortness of breath, chest pain, palpitations, cough, abdominal distention, dizziness or weight gain.   Is smoking 3-4 cigarette daily. Drinking 1-2 cans of 12-16 oz beer daily.   At last visit, furosemide, potassium, valsartan and amlodipine were started. He denies any issues with taking any of these medications and overall says that he feels "great" and wonders why he had to come into the office today.   ROS: All systems negative except as listed in HPI, PMH and Problem List.  SH:  Social History   Socioeconomic History   Marital status: Married    Spouse name: Not on file   Number of children: 1   Years of education: Not on file   Highest education level: Not on file  Occupational History   Not on file  Tobacco Use   Smoking status: Heavy Smoker    Current packs/day: 0.50    Average packs/day: 0.5 packs/day for 20.0 years (10.0 ttl pk-yrs)    Types: Cigarettes   Smokeless tobacco: Never   Tobacco comments:    decreased, 1/2 pack per week.  Vaping Use   Vaping status: Never Used  Substance and Sexual  Activity   Alcohol use: Yes    Alcohol/week: 28.0 standard drinks of alcohol    Types: 28 Cans of beer per week   Drug use: Yes    Frequency: 1.0 times per week    Types: "Crack" cocaine    Comment: used 4 days prior to admission   Sexual activity: Not Currently    Partners: Female  Other Topics Concern   Not on file  Social History Narrative   Not on file   Social Determinants of Health   Financial Resource Strain: High Risk (09/29/2022)   Overall Financial Resource Strain (CARDIA)    Difficulty of Paying Living Expenses: Very hard  Food Insecurity: Food Insecurity Present (09/29/2022)   Hunger Vital Sign    Worried About Programme researcher, broadcasting/film/video in the Last Year: Often true    Ran Out of Food in the Last Year: Often true  Transportation Needs: Unmet Transportation Needs (09/29/2022)   PRAPARE - Administrator, Civil Service (Medical): Yes    Lack of Transportation (Non-Medical): Yes  Physical Activity: Not on file  Stress: Not on file  Social Connections: Not on file  Intimate Partner Violence: Not At Risk (09/18/2022)   Humiliation, Afraid, Rape, and Kick questionnaire    Fear of Current or Ex-Partner: No    Emotionally Abused: No    Physically Abused: No    Sexually Abused: No    FH:  Family History  Problem Relation Age of Onset  Hypertension Father     Past Medical History:  Diagnosis Date   Pancreatic mass    Thrombosis, portal vein     Current Outpatient Medications  Medication Sig Dispense Refill   amLODipine (NORVASC) 5 MG tablet Take 1 tablet (5 mg total) by mouth daily. (Patient not taking: Reported on 09/29/2022) 30 tablet 0   amLODipine (NORVASC) 5 MG tablet Take 1 tablet (5 mg total) by mouth daily. 90 tablet 3   ferrous sulfate 325 (65 FE) MG tablet Take 1 tablet (325 mg total) by mouth daily. 30 tablet 3   furosemide (LASIX) 20 MG tablet Take 1 tablet (20 mg total) by mouth daily. 30 tablet 5   valsartan (DIOVAN) 80 MG tablet Take 1 tablet  (80 mg total) by mouth daily. 90 tablet 3   No current facility-administered medications for this visit.   Vitals:   10/19/22 1312  BP: (!) 152/78  Pulse: 76  Resp: 14  SpO2: 100%  Weight: 144 lb 4 oz (65.4 kg)   Wt Readings from Last 3 Encounters:  10/19/22 144 lb 4 oz (65.4 kg)  09/29/22 146 lb 6 oz (66.4 kg)  09/20/22 134 lb 12.8 oz (61.1 kg)   Lab Results  Component Value Date   CREATININE 1.09 09/29/2022   CREATININE 1.14 09/20/2022   CREATININE 1.06 09/19/2022   PHYSICAL EXAM:  General:  Well appearing. No resp difficulty HEENT: normal Neck: supple. JVP flat. No lymphadenopathy or thryomegaly appreciated. Cor: PMI normal. Regular rate & rhythm. No rubs, gallops or murmurs. Lungs: clear Abdomen: soft, nontender, nondistended. No hepatosplenomegaly. No bruits or masses.  Extremities: no cyanosis, clubbing, rash, 1+ pitting edema around left ankle Neuro: alert & orientedx3, cranial nerves grossly intact. Moves all 4 extremities w/o difficulty. Affect pleasant.   ECG: not done   ASSESSMENT & PLAN:  1: Chronic heart failure with preserved ejection fraction- - suspect due to alcohol use - NYHA class II - euvolemic - unable to weigh as he doesn't have scales and can't afford them - weight down 2 pounds from last visit here 3 weeks ago - Echo 09/18/22: EF 50-55% with mild MR - continue furosemide 20mg  daily - continue valsartan 80mg  daily - add spironolactone 25mg  daily - BMP today & then again in 2 weeks - BNP 09/17/22 was 530.6  2: HTN- - BP 152/78 - adding spironolactone per above - continue amlodipine 5mg  daily - needs PCP but transportation is an issue (will be eligible for medicare in Nov) - BMP 09/29/22 showed sodium 130, potassium 4.5, creatinine 1.09 & GFR >60 - BMP today  3: Alcohol use- - drinks 1-2 cans of 12-16oz beers daily (drinking less than previous visit) - discussed the importance of not drinking any alcohol  4: Anemia- - saw hematology  12/21 - hemoglobin 09/29/22 was 8.9  5: Hyponatremia- - sodium 120 on admission 09/17/22, treated with tolvaptan - sodium 122 on 9/1 - sodium 129 on 9/2 - sodium 130 on 9/10 - BMP today  Return in 2 weeks, sooner if needed.

## 2022-10-19 ENCOUNTER — Encounter: Payer: Self-pay | Admitting: Hematology and Oncology

## 2022-10-19 ENCOUNTER — Other Ambulatory Visit: Payer: Self-pay

## 2022-10-19 ENCOUNTER — Encounter: Payer: Self-pay | Admitting: Family

## 2022-10-19 ENCOUNTER — Other Ambulatory Visit
Admission: RE | Admit: 2022-10-19 | Discharge: 2022-10-19 | Disposition: A | Discharge status: Home / Self Care | Payer: Self-pay | Source: Ambulatory Visit | Attending: Family | Admitting: Family

## 2022-10-19 ENCOUNTER — Ambulatory Visit (HOSPITAL_BASED_OUTPATIENT_CLINIC_OR_DEPARTMENT_OTHER): Payer: Self-pay | Admitting: Family

## 2022-10-19 VITALS — BP 152/78 | HR 76 | Resp 14 | Wt 144.2 lb

## 2022-10-19 DIAGNOSIS — I5032 Chronic diastolic (congestive) heart failure: Secondary | ICD-10-CM | POA: Insufficient documentation

## 2022-10-19 DIAGNOSIS — D509 Iron deficiency anemia, unspecified: Secondary | ICD-10-CM

## 2022-10-19 DIAGNOSIS — I10 Essential (primary) hypertension: Secondary | ICD-10-CM

## 2022-10-19 DIAGNOSIS — Z789 Other specified health status: Secondary | ICD-10-CM

## 2022-10-19 LAB — BASIC METABOLIC PANEL
Anion gap: 9 (ref 5–15)
BUN: 17 mg/dL (ref 8–23)
CO2: 23 mmol/L (ref 22–32)
Calcium: 9.3 mg/dL (ref 8.9–10.3)
Chloride: 96 mmol/L — ABNORMAL LOW (ref 98–111)
Creatinine, Ser: 1.14 mg/dL (ref 0.61–1.24)
GFR, Estimated: >60 mL/min (ref >60)
Glucose, Bld: 82 mg/dL (ref 70–99)
Potassium: 4.4 mmol/L (ref 3.5–5.1)
Sodium: 128 mmol/L — ABNORMAL LOW (ref 135–145)

## 2022-10-19 MED ORDER — SPIRONOLACTONE 25 MG PO TABS
25.0000 mg | ORAL_TABLET | Freq: Every day | ORAL | 3 refills | Status: DC
  Filled 2022-10-19: qty 90, 90d supply, fill #0

## 2022-10-19 NOTE — Patient Instructions (Addendum)
START SPIRONOLACTONE 25 MG ONCE DAILY  Go over to the [facility]. Go pass the gift shop and have your blood work completed.  ONCE YOU'VE HAD YOUR BLOOD WORK COMPLETED, HAVE THE COURTESY CAR TAKE YOU TO THE PHARMACY TO PICK UP YOUR PRESCRIPTION.  AND HAVE THE PHARMACY CALL FOR YOUR TAXI - [facility] [phone]

## 2022-10-28 NOTE — Progress Notes (Signed)
Advanced Heart Failure Clinic Note    PCP: Pcp, No Cardiologist: None   HPI:  Wyatt Montgomery is a 67 y/o male with a history of hyponatremia, polysubstance abuse, HTN, portal vein thrombosis, IDA, hypokalemia, alcohol use, chronic pancreatitis, pancreatitic cyst, chronic thrombocytosis and chronic heart failure.   Admitted 09/17/22 due to chest pain, cough, shortness of breath and lower extremity swelling. He had nausea and vomiting the night prior to admission but he attributed this to drinking liquor. Diuresed. Treated with tolvaptan due to hyponatremia. S/p IV iron sucrose infusion on 09/18/2022 and 09/19/2022 due to anemia.   Echo 09/18/22: EF 50-55% with mild Wyatt  He presents today for a HF follow-up visit without any complaints. He specifically denies any shortness of breath, fatigue, chest pain, cough, palpitations, abdominal distention, pedal edema, dizziness or difficulty sleeping. Says that he's eating "too good and too much". Not adding salt and is trying to closely follow a low sodium diet.  Is smoking 2-3 cigarettes daily. Drinking 1 can of 12-16 oz beer daily.   At last visit, spironolactone 25mg  daily was added and he doesn't notice any issues with it. Says that he's taken all his medications today.   ROS: All systems negative except as listed in HPI, PMH and Problem List.  SH:  Social History   Socioeconomic History   Marital status: Married    Spouse name: Not on file   Number of children: 1   Years of education: Not on file   Highest education level: Not on file  Occupational History   Not on file  Tobacco Use   Smoking status: Heavy Smoker    Current packs/day: 0.50    Average packs/day: 0.5 packs/day for 20.0 years (10.0 ttl pk-yrs)    Types: Cigarettes   Smokeless tobacco: Never   Tobacco comments:    decreased, 1/2 pack per week.  Vaping Use   Vaping status: Never Used  Substance and Sexual Activity   Alcohol use: Yes    Alcohol/week: 28.0 standard  drinks of alcohol    Types: 28 Cans of beer per week   Drug use: Yes    Frequency: 1.0 times per week    Types: "Crack" cocaine    Comment: used 4 days prior to admission   Sexual activity: Not Currently    Partners: Female  Other Topics Concern   Not on file  Social History Narrative   Not on file   Social Determinants of Health   Financial Resource Strain: High Risk (09/29/2022)   Overall Financial Resource Strain (CARDIA)    Difficulty of Paying Living Expenses: Very hard  Food Insecurity: Food Insecurity Present (09/29/2022)   Hunger Vital Sign    Worried About Programme researcher, broadcasting/film/video in the Last Year: Often true    Ran Out of Food in the Last Year: Often true  Transportation Needs: Unmet Transportation Needs (09/29/2022)   PRAPARE - Administrator, Civil Service (Medical): Yes    Lack of Transportation (Non-Medical): Yes  Physical Activity: Not on file  Stress: Not on file  Social Connections: Not on file  Intimate Partner Violence: Not At Risk (09/18/2022)   Humiliation, Afraid, Rape, and Kick questionnaire    Fear of Current or Ex-Partner: No    Emotionally Abused: No    Physically Abused: No    Sexually Abused: No    FH:  Family History  Problem Relation Age of Onset   Hypertension Father  Past Medical History:  Diagnosis Date   Pancreatic mass    Thrombosis, portal vein     Current Outpatient Medications  Medication Sig Dispense Refill   amLODipine (NORVASC) 5 MG tablet Take 1 tablet (5 mg total) by mouth daily. 90 tablet 3   ferrous sulfate 325 (65 FE) MG tablet Take 1 tablet (325 mg total) by mouth daily. 30 tablet 3   furosemide (LASIX) 20 MG tablet Take 1 tablet (20 mg total) by mouth daily. 30 tablet 5   spironolactone (ALDACTONE) 25 MG tablet Take 1 tablet (25 mg total) by mouth daily. 90 tablet 3   valsartan (DIOVAN) 80 MG tablet Take 1 tablet (80 mg total) by mouth daily. 90 tablet 3   No current facility-administered medications for  this visit.   Vitals:   11/03/22 1316 11/03/22 1317  BP: (!) 164/83 (!) 154/84  Pulse: 83 84  SpO2: 100% 100%  Weight: 147 lb (66.7 kg)    Wt Readings from Last 3 Encounters:  11/03/22 147 lb (66.7 kg)  10/19/22 144 lb 4 oz (65.4 kg)  09/29/22 146 lb 6 oz (66.4 kg)   Lab Results  Component Value Date   CREATININE 1.14 10/19/2022   CREATININE 1.09 09/29/2022   CREATININE 1.14 09/20/2022   PHYSICAL EXAM:  General:  Well appearing. No resp difficulty HEENT: normal Neck: supple. JVP flat. No lymphadenopathy or thryomegaly appreciated. Cor: PMI normal. Regular rate & rhythm. No rubs, gallops or murmurs. Lungs: clear Abdomen: soft, nontender, nondistended. No hepatosplenomegaly. No bruits or masses.  Extremities: no cyanosis, clubbing, rash, edema Neuro: alert & orientedx3, cranial nerves grossly intact. Moves all 4 extremities w/o difficulty. Affect pleasant.   ECG: not done   ASSESSMENT & PLAN:  1: Chronic heart failure with preserved ejection fraction- - suspect due to alcohol use - NYHA class I - euvolemic - unable to weigh as he doesn't have scales and can't afford them - weight up 3 pounds from last visit here 2 weeks ago - Echo 09/18/22: EF 50-55% with mild Wyatt - continue furosemide 20mg  daily - continue valsartan 80mg  daily - continue spironolactone 25mg  daily - BMP  - BNP 09/17/22 was 530.6  2: HTN- - BP 164/83 - increase amlodipine to 10mg  daily - needs PCP but transportation is an issue (will be eligible for medicare in Nov) - BMP 10/19/22 showed sodium 128, potassium 4.4, creatinine 1.14 & GFR >60 - BMP today  3: Alcohol use- - drinks 1-2 cans of 12-16oz beers daily (drinking less than previous visit) - discussed the importance of not drinking any alcohol  4: Anemia- - saw hematology 12/21 - hemoglobin 09/29/22 was 8.9  5: Hyponatremia- - sodium 120 on admission 09/17/22, treated with tolvaptan - sodium 122 on 9/1 - sodium 129 on 9/2 - sodium 130  on 9/10 - sodium 128 on 9/30 - BMP today  Return in 1 month, sooner if needed.

## 2022-10-30 ENCOUNTER — Telehealth (HOSPITAL_COMMUNITY): Payer: Self-pay | Admitting: Licensed Clinical Social Worker

## 2022-10-30 NOTE — Telephone Encounter (Signed)
CSW attempted to call pt to set up ride for appt on 10/15- unable to reach or leave VM- sent text message.  Went ahead and arranged taxi for 10/15 at 1pm pick up as patient frequently does not answer calls and taxi company requires 2 day notice to guarantee ride.  Will attempt to contact patient again on Monday to confirm he is aware of ride details.  Burna Sis, LCSW Clinical Social Worker [facility] Desk#: [phone] Cell#: [phone]

## 2022-11-02 ENCOUNTER — Telehealth (HOSPITAL_COMMUNITY): Payer: Self-pay | Admitting: Licensed Clinical Social Worker

## 2022-11-02 NOTE — Telephone Encounter (Signed)
H&V Care Navigation CSW Progress Note  Clinical Social Worker attempted to call pt to inform of transport details for tomorrow- unable to reach.  Informed [place] [facility] clinic of this in case he calls in inquiring about ride details.  Confirmed with Cheyenne Adas that transport is set for 1pm tomorrow.   SDOH Screenings   Food Insecurity: Food Insecurity Present (09/29/2022)  Housing: Patient Declined (09/29/2022)  Transportation Needs: Unmet Transportation Needs (09/29/2022)  Utilities: Not At Risk (09/18/2022)  Financial Resource Strain: High Risk (09/29/2022)  Tobacco Use: High Risk (10/19/2022)    Burna Sis, LCSW Clinical Social Worker [facility] Desk#: [phone] Cell#: [phone]

## 2022-11-03 ENCOUNTER — Encounter: Payer: Self-pay | Admitting: Hematology and Oncology

## 2022-11-03 ENCOUNTER — Encounter: Payer: Self-pay | Admitting: Family

## 2022-11-03 ENCOUNTER — Other Ambulatory Visit: Payer: Self-pay

## 2022-11-03 ENCOUNTER — Ambulatory Visit (HOSPITAL_BASED_OUTPATIENT_CLINIC_OR_DEPARTMENT_OTHER): Payer: Self-pay | Admitting: Family

## 2022-11-03 ENCOUNTER — Other Ambulatory Visit
Admission: RE | Admit: 2022-11-03 | Discharge: 2022-11-03 | Disposition: A | Discharge status: Home / Self Care | Payer: Self-pay | Source: Ambulatory Visit | Attending: Family | Admitting: Family

## 2022-11-03 VITALS — BP 154/84 | HR 84 | Wt 147.0 lb

## 2022-11-03 DIAGNOSIS — I10 Essential (primary) hypertension: Secondary | ICD-10-CM

## 2022-11-03 DIAGNOSIS — I5032 Chronic diastolic (congestive) heart failure: Secondary | ICD-10-CM | POA: Insufficient documentation

## 2022-11-03 DIAGNOSIS — D509 Iron deficiency anemia, unspecified: Secondary | ICD-10-CM

## 2022-11-03 DIAGNOSIS — Z789 Other specified health status: Secondary | ICD-10-CM

## 2022-11-03 DIAGNOSIS — E871 Hypo-osmolality and hyponatremia: Secondary | ICD-10-CM

## 2022-11-03 LAB — BASIC METABOLIC PANEL
Anion gap: 11 (ref 5–15)
BUN: 14 mg/dL (ref 8–23)
CO2: 23 mmol/L (ref 22–32)
Calcium: 9.7 mg/dL (ref 8.9–10.3)
Chloride: 100 mmol/L (ref 98–111)
Creatinine, Ser: 1.12 mg/dL (ref 0.61–1.24)
GFR, Estimated: >60 mL/min (ref >60)
Glucose, Bld: 83 mg/dL (ref 70–99)
Potassium: 4.4 mmol/L (ref 3.5–5.1)
Sodium: 134 mmol/L — ABNORMAL LOW (ref 135–145)

## 2022-11-03 MED ORDER — AMLODIPINE BESYLATE 10 MG PO TABS
10.0000 mg | ORAL_TABLET | Freq: Every day | ORAL | 3 refills | Status: DC
Start: 2022-11-03 — End: 2022-12-09
  Filled 2022-11-03: qty 90, 90d supply, fill #0

## 2022-11-03 NOTE — Patient Instructions (Addendum)
Instructions once you leave Heart Failure Clinic  Go over to the [facility]. Go pass the gift shop and have your blood work completed.  Then, have the courier Zenaida Niece take you to the pharmacy across the parking lot to get your new prescription.  Then, have the pharmacy call your taxi [phone] to take you home   Medication Instructions  FINISH you current Amlodipine prescription by taking 2 pills daily of the 5 MG.  ONCE YOU'VE FINISHED IT, START YOUR NEW AMLODIPINE 10 MG PRESCRIPTION BY TAKING 1 PILL DAILY

## 2022-12-02 ENCOUNTER — Telehealth (HOSPITAL_COMMUNITY): Payer: Self-pay | Admitting: Licensed Clinical Social Worker

## 2022-12-02 NOTE — Telephone Encounter (Signed)
CSW attempted to call pt to discuss transportation to appt next week- unable to reach or leave VM- will continue to attempt contact.  Burna Sis, LCSW Clinical Social Worker [facility] Desk#: [phone] Cell#: [phone]

## 2022-12-03 ENCOUNTER — Telehealth (HOSPITAL_COMMUNITY): Payer: Self-pay | Admitting: Licensed Clinical Social Worker

## 2022-12-03 NOTE — Telephone Encounter (Signed)
  CSW attempted to call pt to discuss transportation to appt next week but unable to reach and unable to leave message  Burna Sis, LCSW Clinical Social Worker [facility] Desk#: [phone] Cell#: [phone]

## 2022-12-07 ENCOUNTER — Telehealth (HOSPITAL_COMMUNITY): Payer: Self-pay | Admitting: Licensed Clinical Social Worker

## 2022-12-07 NOTE — Telephone Encounter (Signed)
CSW attempted to call pt regarding transport for appt on Wednesday but unable to reach or leave VM.  Attempted to call hotel but unable to get through and attempted emergency contact who reports she has no other contact info for him and hasn't spoken to him in months.  Sent text message to patient.  Will await response.  Burna Sis, LCSW Clinical Social Worker [facility] Desk#: [phone] Cell#: [phone]

## 2022-12-09 ENCOUNTER — Encounter: Payer: Self-pay | Admitting: Family

## 2022-12-09 ENCOUNTER — Telehealth (HOSPITAL_COMMUNITY): Payer: Self-pay | Admitting: Licensed Clinical Social Worker

## 2022-12-09 ENCOUNTER — Other Ambulatory Visit: Payer: Self-pay | Admitting: *Deleted

## 2022-12-09 ENCOUNTER — Other Ambulatory Visit: Payer: Self-pay

## 2022-12-09 ENCOUNTER — Other Ambulatory Visit (HOSPITAL_COMMUNITY): Payer: Self-pay

## 2022-12-09 ENCOUNTER — Encounter: Payer: Self-pay | Admitting: Hematology and Oncology

## 2022-12-09 ENCOUNTER — Ambulatory Visit: Payer: Self-pay | Attending: Family | Admitting: Family

## 2022-12-09 VITALS — BP 135/70 | HR 74 | Ht 70.0 in | Wt 144.0 lb

## 2022-12-09 DIAGNOSIS — F17210 Nicotine dependence, cigarettes, uncomplicated: Secondary | ICD-10-CM | POA: Insufficient documentation

## 2022-12-09 DIAGNOSIS — D509 Iron deficiency anemia, unspecified: Secondary | ICD-10-CM | POA: Insufficient documentation

## 2022-12-09 DIAGNOSIS — Z789 Other specified health status: Secondary | ICD-10-CM

## 2022-12-09 DIAGNOSIS — I5032 Chronic diastolic (congestive) heart failure: Secondary | ICD-10-CM | POA: Insufficient documentation

## 2022-12-09 DIAGNOSIS — I110 Hypertensive heart disease with heart failure: Secondary | ICD-10-CM | POA: Insufficient documentation

## 2022-12-09 DIAGNOSIS — F1090 Alcohol use, unspecified, uncomplicated: Secondary | ICD-10-CM | POA: Insufficient documentation

## 2022-12-09 DIAGNOSIS — I10 Essential (primary) hypertension: Secondary | ICD-10-CM

## 2022-12-09 DIAGNOSIS — E871 Hypo-osmolality and hyponatremia: Secondary | ICD-10-CM | POA: Insufficient documentation

## 2022-12-09 MED ORDER — FUROSEMIDE 20 MG PO TABS
20.0000 mg | ORAL_TABLET | Freq: Every day | ORAL | 5 refills | Status: DC
  Filled 2022-12-09: qty 30, 30d supply, fill #0

## 2022-12-09 MED ORDER — AMLODIPINE BESYLATE 10 MG PO TABS
10.0000 mg | ORAL_TABLET | Freq: Every day | ORAL | 3 refills | Status: DC
  Filled 2022-12-09: qty 30, 30d supply, fill #0

## 2022-12-09 MED ORDER — MENTHOL 5.8 MG MT LOZG
1.0000 | LOZENGE | OROMUCOSAL | 0 refills | Status: DC | PRN
  Filled 2022-12-09: qty 30, 30d supply, fill #0

## 2022-12-09 NOTE — Telephone Encounter (Signed)
H&V Care Navigation CSW Progress Note  Clinical Social Worker informed that pt called into clinic to inquire about taxi for today- CSW called taxi company and they are able to accommodate- pick up at 1pm from [employer]   SDOH Screenings   Food Insecurity: Food Insecurity Present (09/29/2022)  Housing: Patient Declined (09/29/2022)  Transportation Needs: Unmet Transportation Needs (09/29/2022)  Utilities: Not At Risk (09/18/2022)  Financial Resource Strain: High Risk (09/29/2022)  Tobacco Use: High Risk (11/03/2022)   Burna Sis, LCSW Clinical Social Worker [facility] Desk#: [phone] Cell#: [phone]

## 2022-12-09 NOTE — Progress Notes (Signed)
Advanced [facility] Failure Clinic Note    PCP: Pcp, No Cardiologist: None   HPI:  Mr Wyatt Montgomery is a 67 y/o male with a history of hyponatremia, polysubstance abuse, HTN, portal vein thrombosis, IDA, hypokalemia, alcohol use, chronic pancreatitis, pancreatitic cyst, chronic thrombocytosis and chronic heart failure.   Admitted 09/17/22 due to chest pain, cough, shortness of breath and lower extremity swelling. He had nausea and vomiting the night prior to admission but he attributed this to drinking liquor. Diuresed. Treated with tolvaptan due to hyponatremia. S/p IV iron sucrose infusion on 09/18/2022 and 09/19/2022 due to anemia.   Echo 09/18/22: EF 50-55% with mild MR  He presents today for a HF follow-up visit with a chief complaint of a sporadic loose cough. He denies fatigue, chest pain, shortness of breath, palpitations, abdominal distention, pedal edema, dizziness or difficulty sleeping. Continue to live at the [facility]. Says that he's trying to get in touch with someone from social services who can help explain his medicare options as he's now getting $180 deducted from his check every month and he can't afford that.   Is smoking 3-4 cigarettes daily. Drinking 1 can of 12-16 oz beer daily.   At last visit, amlodipine was increased to 10mg  daily, which he started, but also continued taking the 5mg  tablet. .   ROS: All systems negative except as listed in HPI, PMH and Problem List.  SH:  Social History   Socioeconomic History   Marital status: Married    Spouse name: Not on file   Number of children: 1   Years of education: Not on file   Highest education level: Not on file  Occupational History   Not on file  Tobacco Use   Smoking status: Heavy Smoker    Current packs/day: 0.50    Average packs/day: 0.5 packs/day for 20.0 years (10.0 ttl pk-yrs)    Types: Cigarettes   Smokeless tobacco: Never   Tobacco comments:    decreased, 1/2 pack per week.  Vaping Use   Vaping  status: Never Used  Substance and Sexual Activity   Alcohol use: Yes    Alcohol/week: 28.0 standard drinks of alcohol    Types: 28 Cans of beer per week   Drug use: Yes    Frequency: 1.0 times per week    Types: "Crack" cocaine    Comment: used 4 days prior to admission   Sexual activity: Not Currently    Partners: Female  Other Topics Concern   Not on file  Social History Narrative   Not on file   Social Determinants of Health   Financial Resource Strain: High Risk (09/29/2022)   Overall Financial Resource Strain (CARDIA)    Difficulty of Paying Living Expenses: Very hard  Food Insecurity: Food Insecurity Present (09/29/2022)   Hunger Vital Sign    Worried About Running Out of Food in the Last Year: Often true    Ran Out of Food in the Last Year: Often true  Transportation Needs: Unmet Transportation Needs (09/29/2022)   PRAPARE - Administrator, Civil Service (Medical): Yes    Lack of Transportation (Non-Medical): Yes  Physical Activity: Not on file  Stress: Not on file  Social Connections: Not on file  Intimate Partner Violence: Not At Risk (09/18/2022)   Humiliation, Afraid, Rape, and Kick questionnaire    Fear of Current or Ex-Partner: No    Emotionally Abused: No    Physically Abused: No    Sexually Abused:  No    FH:  Family History  Problem Relation Age of Onset   Hypertension Father     Past Medical History:  Diagnosis Date   Pancreatic mass    Thrombosis, portal vein     Current Outpatient Medications  Medication Sig Dispense Refill   amLODipine (NORVASC) 10 MG tablet Take 1 tablet (10 mg total) by mouth daily. 90 tablet 3   amLODipine (NORVASC) 5 MG tablet Take 1 tablet (5 mg total) by mouth daily. 90 tablet 3   ferrous sulfate 325 (65 FE) MG tablet Take 1 tablet (325 mg total) by mouth daily. 30 tablet 3   furosemide (LASIX) 20 MG tablet Take 1 tablet (20 mg total) by mouth daily. 30 tablet 5   spironolactone (ALDACTONE) 25 MG tablet Take 1  tablet (25 mg total) by mouth daily. 90 tablet 3   valsartan (DIOVAN) 80 MG tablet Take 1 tablet (80 mg total) by mouth daily. 90 tablet 3   No current facility-administered medications for this visit.   Vitals:   12/09/22 1321  BP: 135/70  Pulse: 74  SpO2: 100%  Weight: 144 lb (65.3 kg)  Height: 5\' 10"  (1.778 m)   Wt Readings from Last 3 Encounters:  12/09/22 144 lb (65.3 kg)  11/03/22 147 lb (66.7 kg)  10/19/22 144 lb 4 oz (65.4 kg)   Lab Results  Component Value Date   CREATININE 1.12 11/03/2022   CREATININE 1.14 10/19/2022   CREATININE 1.09 09/29/2022   PHYSICAL EXAM:  General:  Well appearing. No resp difficulty HEENT: normal Neck: supple. JVP flat. No lymphadenopathy or thryomegaly appreciated. Cor: PMI normal. Regular rate & rhythm. No rubs, gallops or murmurs. Lungs: few rhonchi in lower lobes that clear when he coughs Abdomen: soft, nontender, nondistended. No hepatosplenomegaly. No bruits or masses.  Extremities: no cyanosis, clubbing, rash, edema Neuro: alert & orientedx3, cranial nerves grossly intact. Moves all 4 extremities w/o difficulty. Affect pleasant.   ECG: not done   ASSESSMENT & PLAN:  1: Chronic heart failure with preserved ejection fraction- - suspect due to alcohol use - NYHA class I - euvolemic - unable to weigh as he doesn't have scales and can't afford them - weight down 3 pounds from last visit here 1 month ago - Echo 09/18/22: EF 50-55% with mild MR - continue furosemide 20mg  daily - continue spironolactone 25mg  daily - continue valsartan 80mg  daily - consider SGLT2 once his insurance gets straightened out - Rx for cough drops sent to [facility] pharmacy - BNP 09/17/22 was 530.6  2: HTN- - BP 135/70 - continue amlodipine 10mg  daily; a large X was put on the 5mg  dose so he can discard it when he gets back home - needs PCP but transportation is an issue  - BMP 11/03/22 showed sodium 134, potassium 4.4, creatinine 1.12 & GFR >60 - BMET  next visit  3: Alcohol use- - drinks 1-2 cans of 12-16oz beers daily  - discussed the importance of not drinking any alcohol  4: Anemia- - saw hematology 12/21 - hemoglobin 09/29/22 was 8.9  5: Hyponatremia- - sodium 120 on admission 09/17/22, treated with tolvaptan - sodium 122 on 9/1 - sodium 129 on 9/2 - sodium 130 on 9/10 - sodium 128 on 9/30 - sodium 134 on 11/03/22   Discussed using mail order through [facility] but he would have to pay for those to be delivered. Estimated ~ $20 and he says that he might could afford it but would like  to keep getting his meds at [facility] pharmacy for now until he gets the insurance situation straightened out. Contact information for [facility] provided who can assist with insurance questions.   Return in 1 month, sooner if needed.

## 2022-12-09 NOTE — Patient Instructions (Addendum)
GO TO THE PHARMACY AND PICK UP YOUR MEDICATIONS.  THEN HAVE THE PHARMACY CALL [facility] TO TAKE YOU HOME. [phone]

## 2023-01-08 ENCOUNTER — Encounter: Payer: Self-pay | Admitting: Family

## 2023-01-11 ENCOUNTER — Encounter: Payer: Self-pay | Admitting: Family

## 2023-01-11 ENCOUNTER — Telehealth (HOSPITAL_COMMUNITY): Payer: Self-pay | Admitting: Licensed Clinical Social Worker

## 2023-01-11 NOTE — Progress Notes (Deleted)
Advanced Heart Failure Clinic Note    PCP: Pcp, No Cardiologist: None   Chief Complaint:  HPI:  Wyatt Montgomery is a 67 y/o male with a history of hyponatremia, polysubstance abuse, HTN, portal vein thrombosis, IDA, hypokalemia, alcohol use, chronic pancreatitis, pancreatitic cyst, chronic thrombocytosis and chronic heart failure.   Admitted 09/17/22 due to chest pain, cough, shortness of breath and lower extremity swelling. He had nausea and vomiting the night prior to admission but he attributed this to drinking liquor. Diuresed. Treated with tolvaptan due to hyponatremia. S/p IV iron sucrose infusion on 09/18/2022 and 09/19/2022 due to anemia.   Echo 09/18/22: EF 50-55% with mild Wyatt  He presents today for a HF follow-up visit with a chief complaint of a sporadic loose cough. He denies fatigue, chest pain, shortness of breath, palpitations, abdominal distention, pedal edema, dizziness or difficulty sleeping. Continue to live at the [facility]. Says that he's trying to get in touch with someone from social services who can help explain his medicare options as he's now getting $180 deducted from his check every month and he can't afford that.   Is smoking 3-4 cigarettes daily. Drinking 1 can of 12-16 oz beer daily.   At last visit, amlodipine was increased to 10mg  daily, which he started, but also continued taking the 5mg  tablet. .   ROS: All systems negative except as listed in HPI, PMH and Problem List.  SH:  Social History   Socioeconomic History   Marital status: Married    Spouse name: Not on file   Number of children: 1   Years of education: Not on file   Highest education level: Not on file  Occupational History   Not on file  Tobacco Use   Smoking status: Heavy Smoker    Current packs/day: 0.50    Average packs/day: 0.5 packs/day for 20.0 years (10.0 ttl pk-yrs)    Types: Cigarettes   Smokeless tobacco: Never   Tobacco comments:    decreased, 1/2 pack per week.   Vaping Use   Vaping status: Never Used  Substance and Sexual Activity   Alcohol use: Yes    Alcohol/week: 28.0 standard drinks of alcohol    Types: 28 Cans of beer per week   Drug use: Yes    Frequency: 1.0 times per week    Types: "Crack" cocaine    Comment: used 4 days prior to admission   Sexual activity: Not Currently    Partners: Female  Other Topics Concern   Not on file  Social History Narrative   Not on file   Social Drivers of Health   Financial Resource Strain: High Risk (09/29/2022)   Overall Financial Resource Strain (CARDIA)    Difficulty of Paying Living Expenses: Very hard  Food Insecurity: Food Insecurity Present (09/29/2022)   Hunger Vital Sign    Worried About Running Out of Food in the Last Year: Often true    Ran Out of Food in the Last Year: Often true  Transportation Needs: Unmet Transportation Needs (09/29/2022)   PRAPARE - Administrator, Civil Service (Medical): Yes    Lack of Transportation (Non-Medical): Yes  Physical Activity: Not on file  Stress: Not on file  Social Connections: Not on file  Intimate Partner Violence: Not At Risk (09/18/2022)   Humiliation, Afraid, Rape, and Kick questionnaire    Fear of Current or Ex-Partner: No    Emotionally Abused: No    Physically Abused: No  Sexually Abused: No    FH:  Family History  Problem Relation Age of Onset   Hypertension Father     Past Medical History:  Diagnosis Date   Pancreatic mass    Thrombosis, portal vein     Current Outpatient Medications  Medication Sig Dispense Refill   amLODipine (NORVASC) 10 MG tablet Take 1 tablet (10 mg total) by mouth daily. 30 tablet 3   ferrous sulfate 325 (65 FE) MG tablet Take 1 tablet (325 mg total) by mouth daily. 30 tablet 3   furosemide (LASIX) 20 MG tablet Take 1 tablet (20 mg total) by mouth daily. 30 tablet 5   Menthol 5.8 MG LOZG Use as directed 1 lozenge (5.8 mg total) in the mouth or throat as needed. 30 lozenge 0    spironolactone (ALDACTONE) 25 MG tablet Take 1 tablet (25 mg total) by mouth daily. 90 tablet 3   valsartan (DIOVAN) 80 MG tablet Take 1 tablet (80 mg total) by mouth daily. 90 tablet 3   No current facility-administered medications for this visit.   There were no vitals filed for this visit.  Wt Readings from Last 3 Encounters:  12/09/22 144 lb (65.3 kg)  11/03/22 147 lb (66.7 kg)  10/19/22 144 lb 4 oz (65.4 kg)   Lab Results  Component Value Date   CREATININE 1.12 11/03/2022   CREATININE 1.14 10/19/2022   CREATININE 1.09 09/29/2022   PHYSICAL EXAM:  General:  Well appearing. No resp difficulty HEENT: normal Neck: supple. JVP flat. No lymphadenopathy or thryomegaly appreciated. Cor: PMI normal. Regular rate & rhythm. No rubs, gallops or murmurs. Lungs: few rhonchi in lower lobes that clear when he coughs Abdomen: soft, nontender, nondistended. No hepatosplenomegaly. No bruits or masses.  Extremities: no cyanosis, clubbing, rash, edema Neuro: alert & orientedx3, cranial nerves grossly intact. Moves all 4 extremities w/o difficulty. Affect pleasant.   ECG: not done   ASSESSMENT & PLAN:  1: Chronic heart failure with preserved ejection fraction- - suspect due to alcohol use - NYHA class I - euvolemic - unable to weigh as he doesn't have scales and can't afford them - weight down 3 pounds from last visit here 1 month ago - Echo 09/18/22: EF 50-55% with mild Wyatt - continue furosemide 20mg  daily - continue spironolactone 25mg  daily - continue valsartan 80mg  daily - consider SGLT2 once his insurance gets straightened out - Rx for cough drops sent to [facility] pharmacy - BNP 09/17/22 was 530.6  2: HTN- - BP 135/70 - continue amlodipine 10mg  daily; a large X was put on the 5mg  dose so he can discard it when he gets back home - needs PCP but transportation is an issue  - BMP 11/03/22 showed sodium 134, potassium 4.4, creatinine 1.12 & GFR >60 - BMET next visit  3: Alcohol  use- - drinks 1-2 cans of 12-16oz beers daily  - discussed the importance of not drinking any alcohol  4: Anemia- - saw hematology 12/21 - hemoglobin 09/29/22 was 8.9  5: Hyponatremia- - sodium 120 on admission 09/17/22, treated with tolvaptan - sodium 122 on 9/1 - sodium 129 on 9/2 - sodium 130 on 9/10 - sodium 128 on 9/30 - sodium 134 on 11/03/22   Discussed using mail order through [facility] but he would have to pay for those to be delivered. Estimated ~ $20 and he says that he might could afford it but would like to keep getting his meds at [facility] pharmacy for now until he  gets the insurance situation straightened out. Contact information for [facility] provided who can assist with insurance questions.   Return in 1 month, sooner if needed.

## 2023-01-11 NOTE — Telephone Encounter (Signed)
CSW informed by clinic staff that patient did not come to appt today because he did not have a taxi set up- patient had never reached out to clinic regarding transportation need so taxi was not arranged.  CSW attempted to call pt to reiterate it is his responsibility to inform us of this need and that unless he has confirmed with [place] that a ride is set up he should assume it is not.  Unable to reach and unable to leave VM  Burna Sis, LCSW Clinical Social Worker Advanced Heart Failure Clinic Desk#: [phone] Cell#: [phone]

## 2023-01-15 ENCOUNTER — Telehealth (HOSPITAL_COMMUNITY): Payer: Self-pay | Admitting: Licensed Clinical Social Worker

## 2023-01-15 NOTE — Telephone Encounter (Signed)
CSW attempted to call pt to inquire about transportation need for upcoming appt- unable to reach- unable to leave VM  Burna Sis, LCSW Clinical Social Worker Advanced [facility] Failure Clinic Desk#: [phone] Cell#: [phone]

## 2023-01-18 ENCOUNTER — Telehealth (HOSPITAL_COMMUNITY): Payer: Self-pay | Admitting: Licensed Clinical Social Worker

## 2023-01-18 NOTE — Telephone Encounter (Signed)
CSW attempted to call pt to discuss transportation to upcoming appointment- unable to leave message- texted patient to inform of need to contact us for [place] to assist with transport  Burna Sis, LCSW Clinical Social Worker Advanced [facility] Failure Clinic Desk#: [phone] Cell#: [phone]

## 2023-01-21 ENCOUNTER — Telehealth (HOSPITAL_COMMUNITY): Payer: Self-pay | Admitting: Licensed Clinical Social Worker

## 2023-01-21 NOTE — Telephone Encounter (Signed)
 CSW attempted to reach patient regarding transportation needs- unable to reach or leave VM.  Burna Sis, LCSW Clinical Social Worker Advanced [facility] Failure Clinic Desk#: [phone] Cell#: [phone]

## 2023-01-25 ENCOUNTER — Telehealth (HOSPITAL_COMMUNITY): Payer: Self-pay | Admitting: Licensed Clinical Social Worker

## 2023-01-25 ENCOUNTER — Encounter: Payer: Self-pay | Admitting: Adult Health

## 2023-01-25 ENCOUNTER — Ambulatory Visit: Payer: Self-pay | Attending: Family | Admitting: Adult Health

## 2023-01-25 VITALS — BP 147/80 | HR 87 | Wt 141.0 lb

## 2023-01-25 DIAGNOSIS — F17210 Nicotine dependence, cigarettes, uncomplicated: Secondary | ICD-10-CM | POA: Diagnosis not present

## 2023-01-25 DIAGNOSIS — D75839 Thrombocytosis, unspecified: Secondary | ICD-10-CM | POA: Diagnosis not present

## 2023-01-25 DIAGNOSIS — I110 Hypertensive heart disease with heart failure: Secondary | ICD-10-CM | POA: Diagnosis not present

## 2023-01-25 DIAGNOSIS — Z5982 Transportation insecurity: Secondary | ICD-10-CM | POA: Diagnosis not present

## 2023-01-25 DIAGNOSIS — Z79899 Other long term (current) drug therapy: Secondary | ICD-10-CM | POA: Diagnosis not present

## 2023-01-25 DIAGNOSIS — I81 Portal vein thrombosis: Secondary | ICD-10-CM | POA: Diagnosis not present

## 2023-01-25 DIAGNOSIS — F1011 Alcohol abuse, in remission: Secondary | ICD-10-CM | POA: Insufficient documentation

## 2023-01-25 DIAGNOSIS — F17200 Nicotine dependence, unspecified, uncomplicated: Secondary | ICD-10-CM

## 2023-01-25 DIAGNOSIS — Z5986 Financial insecurity: Secondary | ICD-10-CM | POA: Insufficient documentation

## 2023-01-25 DIAGNOSIS — I5032 Chronic diastolic (congestive) heart failure: Secondary | ICD-10-CM | POA: Insufficient documentation

## 2023-01-25 DIAGNOSIS — Z5901 Sheltered homelessness: Secondary | ICD-10-CM | POA: Diagnosis not present

## 2023-01-25 DIAGNOSIS — K861 Other chronic pancreatitis: Secondary | ICD-10-CM | POA: Diagnosis not present

## 2023-01-25 DIAGNOSIS — E871 Hypo-osmolality and hyponatremia: Secondary | ICD-10-CM | POA: Insufficient documentation

## 2023-01-25 DIAGNOSIS — I10 Essential (primary) hypertension: Secondary | ICD-10-CM

## 2023-01-25 NOTE — Progress Notes (Signed)
 H&V Care Navigation CSW Progress Note  Clinical Social Worker consulted to assist patient with current housing concerns.  Patient is living at [facility] where he has been the past year.  Had a roommate who was helping with charges but states they moved out and now he is on his own.  Rate is $99.50 a night- patient only getting $775/month after starting Medicare this month and having to pay premium.  Patient care fund will pay for 2 nights till patient gets check on Wednesday.  Patient understands that current living situation is not sustainable- will search for more affordable hotel while CSW attempts to help patient find more permanent housing options.  Provided with information for [employer] winter shelter and coordinated entry- patient to call and do assessment and will keep in mind in case cannot find alternative hotel option.  Patient should not be getting premium taken out of check given limited income and no resources.  CSW assisting patient with applying for medicaid to assist with this.   SDOH Screenings   Food Insecurity: Food Insecurity Present (09/29/2022)  Housing: High Risk (01/25/2023)  Transportation Needs: Unmet Transportation Needs (01/25/2023)  Utilities: Not At Risk (09/18/2022)  Financial Resource Strain: High Risk (09/29/2022)  Tobacco Use: High Risk (01/25/2023)   Andriette HILARIO Leech, LCSW Clinical Social Worker Advanced [facility] Failure Clinic Desk#: [phone] Cell#: [phone]

## 2023-01-25 NOTE — Telephone Encounter (Signed)
 H&V Care Navigation CSW Progress Note  Patient called clinic to inquire about transportation for appt today.  Able to arrange taxi through [employer]- pick up between 12:30- 1pm- patient informed.   SDOH Screenings   Food Insecurity: Food Insecurity Present (09/29/2022)  Housing: Patient Declined (09/29/2022)  Transportation Needs: Unmet Transportation Needs (01/25/2023)  Utilities: Not At Risk (09/18/2022)  Financial Resource Strain: High Risk (09/29/2022)  Tobacco Use: High Risk (12/09/2022)    01/25/2023  Wyatt Montgomery DOB: [birth year] MRN: [id number]   RIDER WAIVER AND RELEASE OF LIABILITY  For the purposes of helping with transportation needs, [facility] partners with outside transportation providers (taxi companies, [place], catering manager.) to give [place] patients or other approved people the choice of on-demand rides Public Librarian) to our buildings for non-emergency visits.  By using [employer], I, the person signing this document, on behalf of myself and/or any legal minors (in my care using the [employer]), agree:  Science Writer given to me are supplied by independent, outside transportation providers who do not work for, or have any affiliation with, [employer]. [facility] is not a transportation company. [facility] has no control over the quality or safety of the rides I get using [employer]. [facility] has no control over whether any outside ride will happen on time or not. [facility] gives no guarantee on the reliability, quality, safety, or availability on any rides, or that no mistakes will happen. I know and accept that traveling by vehicle (car, truck, SVU, fleeta, bus, taxi, etc.) has risks of serious injuries such as disability, being paralyzed, and death. I know and agree the risk of using [employer] is mine alone, and not [employer]. [employer] are provided as is and as are available. The transportation providers are  in charge for all inspections and care of the vehicles used to provide these rides. I agree not to take legal action against [facility], its agents, employees, officers, directors, representatives, insurers, attorneys, assigns, successors, subsidiaries, and affiliates at any time for any reasons related directly or indirectly to using [employer]. I also agree not to take legal action against [place] or its affiliates for any injury, death, or damage to property caused by or related to using [employer]. I have read this Waiver and Release of Liability, and I understand the terms used in it and their legal meaning. This Waiver is freely and voluntarily given with the understanding that my right (or any legal minors) to legal action against [place] relating to [employer] is knowingly given up to use these services.   I attest that I read the Ride Waiver and Release of Liability to Wyatt Montgomery, gave [name] the opportunity to ask questions and answered the questions asked (if any). I affirm that [name] then provided consent for assistance with transportation.     Norman Bier H Ples Trudel

## 2023-01-25 NOTE — Progress Notes (Signed)
 Advanced Heart Failure Clinic Note    PCP: Pcp h Medicaid  Cardiologist:Tina Hackney NP  Chief Complaint: Heart Failure  HPI: Mr Wyatt Montgomery is a 68 y/o male with a history of hyponatremia, polysubstance abuse, HTN, portal vein thrombosis, IDA, hypokalemia, alcohol use, chronic pancreatitis, pancreatitic cyst, chronic thrombocytosis and chronic HFpEF.    Admitted 09/17/22 due to chest pain, cough, shortness of breath and lower extremity swelling. He had nausea and vomiting the night prior to admission but he attributed this to drinking liquor. Diuresed. Treated with tolvaptan  due to hyponatremia. S/p IV iron  sucrose infusion on 09/18/2022 and 09/19/2022 due to anemia.   Today he returns for HF follow up.Overall feeling fine. Denies SOB/PND/Orthopnea. Says he walks to the grocery store.  Appetite ok. No fever or chills. He has not taken any medications today. He tells me he wants to eat first.  He tells me he rarely misses his appointments. He just got Medicaid. Drinking a beer a day. Smoking 1-2 cigarettes per day.  Lives in a motel and getting kicked out today because he  couldn't afford to pay the bill.  He doesn't get a check until Wednesday.   Cardiac Imaging Echo 09/18/22: EF 50-55% with mild MR  ROS: All systems negative except as listed in HPI, PMH and Problem List.  SH:  Social History   Socioeconomic History   Marital status: Married    Spouse name: Not on file   Number of children: 1   Years of education: Not on file   Highest education level: Not on file  Occupational History   Not on file  Tobacco Use   Smoking status: Heavy Smoker    Current packs/day: 0.50    Average packs/day: 0.5 packs/day for 20.0 years (10.0 ttl pk-yrs)    Types: Cigarettes   Smokeless tobacco: Never   Tobacco comments:    decreased, 1/2 pack per week.  Vaping Use   Vaping status: Never Used  Substance and Sexual Activity   Alcohol use: Yes    Alcohol/week: 28.0 standard drinks of alcohol     Types: 28 Cans of beer per week   Drug use: Yes    Frequency: 1.0 times per week    Types: Crack cocaine    Comment: used 4 days prior to admission   Sexual activity: Not Currently    Partners: Female  Other Topics Concern   Not on file  Social History Narrative   Not on file   Social Drivers of Health   Financial Resource Strain: High Risk (09/29/2022)   Overall Financial Resource Strain (CARDIA)    Difficulty of Paying Living Expenses: Very hard  Food Insecurity: Food Insecurity Present (09/29/2022)   Hunger Vital Sign    Worried About Programme Researcher, Broadcasting/film/video in the Last Year: Often true    Ran Out of Food in the Last Year: Often true  Transportation Needs: Unmet Transportation Needs (01/25/2023)   PRAPARE - Administrator, Civil Service (Medical): Yes    Lack of Transportation (Non-Medical): Yes  Physical Activity: Not on file  Stress: Not on file  Social Connections: Not on file  Intimate Partner Violence: Not At Risk (09/18/2022)   Humiliation, Afraid, Rape, and Kick questionnaire    Fear of Current or Ex-Partner: No    Emotionally Abused: No    Physically Abused: No    Sexually Abused: No    FH:  Family History  Problem Relation Age of Onset   Hypertension  Father     Past Medical History:  Diagnosis Date   Pancreatic mass    Thrombosis, portal vein     Current Outpatient Medications  Medication Sig Dispense Refill   amLODipine  (NORVASC ) 10 MG tablet Take 1 tablet (10 mg total) by mouth daily. 30 tablet 3   furosemide  (LASIX ) 20 MG tablet Take 1 tablet (20 mg total) by mouth daily. 30 tablet 5   spironolactone  (ALDACTONE ) 25 MG tablet Take 1 tablet (25 mg total) by mouth daily. 90 tablet 3   valsartan  (DIOVAN ) 80 MG tablet Take 1 tablet (80 mg total) by mouth daily. 90 tablet 3   ferrous sulfate  325 (65 FE) MG tablet Take 1 tablet (325 mg total) by mouth daily. 30 tablet 3   Menthol  5.8 MG LOZG Use as directed 1 lozenge (5.8 mg total) in the mouth or  throat as needed. 30 lozenge 0   No current facility-administered medications for this visit.   Vitals:   01/25/23 1325  BP: (!) 147/80  Pulse: 87  SpO2: 98%  Weight: 141 lb (64 kg)    Wt Readings from Last 3 Encounters:  01/25/23 141 lb (64 kg)  12/09/22 144 lb (65.3 kg)  11/03/22 147 lb (66.7 kg)   Lab Results  Component Value Date   CREATININE 1.12 11/03/2022   CREATININE 1.14 10/19/2022   CREATININE 1.09 09/29/2022   PHYSICAL EXAM: General:  Thin. Wearing multiple layers of clothes.  No resp difficulty HEENT: normal Neck: supple. no JVD. Carotids 2+ bilat; no bruits. No lymphadenopathy or thryomegaly appreciated. Cor: PMI nondisplaced. Regular rate & rhythm. No rubs, gallops or murmurs. Lungs: clear Abdomen: soft, nontender, nondistended. No hepatosplenomegaly. No bruits or masses. Good bowel sounds. Extremities: no cyanosis, clubbing, rash, edema Neuro: alert & orientedx3, cranial nerves grossly intact. moves all 4 extremities w/o difficulty. Affect pleasant   ASSESSMENT & PLAN:  1. Chronic heart failure with preserved ejection fraction - suspect due to alcohol use - - Echo 09/18/22: EF 50-55% with mild MR - NYHA I. Appears euvolemic. Continue furosemide  20mg  daily - continue spironolactone  25mg  daily - continue valsartan  80mg  daily - We gave him some food during his appointment so he could take his medications.   2.  HTN -Stable he has not had his medications today  -Continue current regimen.   3.  Alcohol use -He has cut way back. Discussed complete cessation   4. Tobacco Abuse -Discussed smoking cessation. He has cut way back. Encouraged to quit.   5. SDOH:  -He is getting kicked out of his motel because he can't afford to pay the bill.  Referred to HFSW to assist with housing. We gave him a plate of food while he was in the clinic.   Needs to establish with PCP. Asked SW to help with this.   Follow up in 4 weeks with Ellouise Sprout.   Vianney Kopecky NP-C    1:58 PM

## 2023-01-28 ENCOUNTER — Other Ambulatory Visit: Payer: Self-pay

## 2023-01-28 ENCOUNTER — Telehealth (HOSPITAL_COMMUNITY): Payer: Self-pay | Admitting: Licensed Clinical Social Worker

## 2023-01-28 ENCOUNTER — Emergency Department: Payer: Medicare Other

## 2023-01-28 ENCOUNTER — Emergency Department
Admission: EM | Admit: 2023-01-28 | Discharge: 2023-01-29 | Disposition: A | Discharge status: Home / Self Care | Payer: Medicare Other | Attending: Emergency Medicine | Admitting: Emergency Medicine

## 2023-01-28 DIAGNOSIS — R079 Chest pain, unspecified: Secondary | ICD-10-CM | POA: Insufficient documentation

## 2023-01-28 DIAGNOSIS — I509 Heart failure, unspecified: Secondary | ICD-10-CM | POA: Diagnosis not present

## 2023-01-28 DIAGNOSIS — R0602 Shortness of breath: Secondary | ICD-10-CM | POA: Insufficient documentation

## 2023-01-28 DIAGNOSIS — R059 Cough, unspecified: Secondary | ICD-10-CM | POA: Insufficient documentation

## 2023-01-28 DIAGNOSIS — Z91148 Patient's other noncompliance with medication regimen for other reason: Secondary | ICD-10-CM | POA: Diagnosis not present

## 2023-01-28 DIAGNOSIS — I110 Hypertensive heart disease with heart failure: Secondary | ICD-10-CM | POA: Diagnosis not present

## 2023-01-28 DIAGNOSIS — Z20822 Contact with and (suspected) exposure to covid-19: Secondary | ICD-10-CM | POA: Insufficient documentation

## 2023-01-28 LAB — BASIC METABOLIC PANEL
Anion gap: 11 (ref 5–15)
BUN: 18 mg/dL (ref 8–23)
CO2: 20 mmol/L — ABNORMAL LOW (ref 22–32)
Calcium: 8.5 mg/dL — ABNORMAL LOW (ref 8.9–10.3)
Chloride: 100 mmol/L (ref 98–111)
Creatinine, Ser: 1.16 mg/dL (ref 0.61–1.24)
GFR, Estimated: >60 mL/min (ref >60)
Glucose, Bld: 99 mg/dL (ref 70–99)
Potassium: 3.6 mmol/L (ref 3.5–5.1)
Sodium: 131 mmol/L — ABNORMAL LOW (ref 135–145)

## 2023-01-28 LAB — RESP PANEL BY RT-PCR (RSV, FLU A&B, COVID)  RVPGX2
Influenza A by PCR: NEGATIVE
Influenza B by PCR: NEGATIVE
Resp Syncytial Virus by PCR: NEGATIVE
SARS Coronavirus 2 by RT PCR: NEGATIVE

## 2023-01-28 LAB — CBC
HCT: 33.6 % — ABNORMAL LOW (ref 39.0–52.0)
Hemoglobin: 11.1 g/dL — ABNORMAL LOW (ref 13.0–17.0)
MCH: 27.5 pg (ref 26.0–34.0)
MCHC: 33 g/dL (ref 30.0–36.0)
MCV: 83.2 fL (ref 80.0–100.0)
Platelets: 296 10*3/uL (ref 150–400)
RBC: 4.04 MIL/uL — ABNORMAL LOW (ref 4.22–5.81)
RDW: 14.2 % (ref 11.5–15.5)
WBC: 6.7 10*3/uL (ref 4.0–10.5)
nRBC: 0 % (ref 0.0–0.2)

## 2023-01-28 LAB — TROPONIN I (HIGH SENSITIVITY)
Troponin I (High Sensitivity): 6 ng/L (ref <18)
Troponin I (High Sensitivity): 5 ng/L (ref <18)

## 2023-01-28 NOTE — Telephone Encounter (Signed)
 CSW called pt to check in regarding housing situation as well as assist in completing medicaid app and submitting to [facility] DHHS workers to turn in to [facility] on patients behalf.  Unable to reach or leave VM.  Will continue to attempt contact.  Andriette HILARIO Leech, LCSW Clinical Social Worker Advanced [facility] Failure Clinic Desk#: [phone] Cell#: [phone]

## 2023-01-28 NOTE — ED Provider Triage Note (Signed)
 Emergency Medicine Provider Triage Evaluation Note  Wyatt Montgomery , a 68 y.o. male  was evaluated in triage.  Pt complains of chest pain, dry cough, chills, fever, sore throat.  Chest pain increased when coughing.   Review of Systems  Positive:  Negative:   Physical Exam  BP 127/74 (BP Location: Right Arm)   Pulse 78   Temp 97.9 F (36.6 C) (Oral)   Resp 19   Ht 5' 10 (1.778 m)   Wt 63 kg   SpO2 100%   BMI 19.93 kg/m  Gen:   Awake, no distress   Resp:  Normal effort  MSK:   Moves extremities without difficulty  Other:    Medical Decision Making  Medically screening exam initiated at 5:55 PM.  Appropriate orders placed.  Wyatt Montgomery was informed that the remainder of the evaluation will be completed by another provider, this initial triage assessment does not replace that evaluation, and the importance of remaining in the ED until their evaluation is complete.  Patient with upper respiratory symptoms, sore throat chest x-ray CBC and CMP   Wyatt Kast, PA-C 01/28/23 1757

## 2023-01-28 NOTE — ED Notes (Signed)
 First Nurse Note: Pt to ED via [facility] from [employer]. Pt c/o chest pain x 2 days. Pain got worse today, Pt is requesting to be admitted per EMS.   Pt given 324 mg of Asprin in route

## 2023-01-28 NOTE — ED Provider Notes (Signed)
 [facility] Provider Note    Event Date/Time   First MD Initiated Contact with Patient 01/28/23 2308     (approximate)   History   Chest Pain   HPI  Wyatt Montgomery is a 68 y.o. male past medical history significant for polysubstance abuse, hypertension, portal vein thrombosis, alcohol use, HFpEF, who presents to the emergency department for chest pain and shortness of breath.  States that he has had 2 days for chest pain and shortness of breath.  Worse with exertion his shortness of breath but no change in his chest pain.  Does endorse a cough that has been green sputum production.  Took aspirin with EMS.  Denies nausea, vomiting or diaphoresis.  No fever or chills.  Denies any significant swelling in his legs.  Followed by heart failure clinic.  Endorses compliance with his home medications.     Physical Exam   Triage Vital Signs: ED Triage Vitals  Encounter Vitals Group     BP 01/28/23 1750 127/74     Systolic BP Percentile --      Diastolic BP Percentile --      Pulse Rate 01/28/23 1750 78     Resp 01/28/23 1750 19     Temp 01/28/23 1750 97.9 F (36.6 C)     Temp Source 01/28/23 1750 Oral     SpO2 01/28/23 1750 100 %     Weight 01/28/23 1751 138 lb 14.2 oz (63 kg)     Height 01/28/23 1751 5' 10 (1.778 m)     Head Circumference --      Peak Flow --      Pain Score 01/28/23 1751 7     Pain Loc --      Pain Education --      Exclude from Growth Chart --     Most recent vital signs: Vitals:   01/28/23 1750 01/28/23 2025  BP: 127/74 (!) 150/96  Pulse: 78 78  Resp: 19 16  Temp: 97.9 F (36.6 C) (!) 97.5 F (36.4 C)  SpO2: 100% 100%    Physical Exam Constitutional:      Appearance: He is well-developed.  HENT:     Head: Atraumatic.  Eyes:     Conjunctiva/sclera: Conjunctivae normal.  Cardiovascular:     Rate and Rhythm: Regular rhythm.     Heart sounds: Normal heart sounds.  Pulmonary:     Effort: Pulmonary effort is normal.  No respiratory distress.  Abdominal:     Palpations: Abdomen is soft.     Tenderness: There is no abdominal tenderness.  Musculoskeletal:        General: Normal range of motion.     Cervical back: Normal range of motion.     Right lower leg: No edema.     Left lower leg: No edema.  Skin:    General: Skin is warm.     Capillary Refill: Capillary refill takes less than 2 seconds.  Neurological:     Mental Status: He is alert. Mental status is at baseline.     IMPRESSION / MDM / ASSESSMENT AND PLAN / ED COURSE  I reviewed the triage vital signs and the nursing notes.  Differential diagnosis including ACS, pneumonia, heart failure exacerbation, COVID/influenza,  anemia  EKG  I, Clotilda Punter, the attending physician, personally viewed and interpreted this ECG.  ST depression with T wave inverted to the inferior and lateral leads.  No significant ST elevation.  Repeat EKG obtained with  no significant change from initial EKG.  RADIOLOGY I independently reviewed imaging, my interpretation of imaging: Chest x-ray no signs of pneumonia.  No signs of pulmonary edema.  LABS (all labs ordered are listed, but only abnormal results are displayed) Labs interpreted as -    Labs Reviewed  BASIC METABOLIC PANEL - Abnormal; Notable for the following components:      Result Value   Sodium 131 (*)    CO2 20 (*)    Calcium 8.5 (*)    All other components within normal limits  CBC - Abnormal; Notable for the following components:   RBC 4.04 (*)    Hemoglobin 11.1 (*)    HCT 33.6 (*)    All other components within normal limits  RESP PANEL BY RT-PCR (RSV, FLU A&B, COVID)  RVPGX2  BRAIN NATRIURETIC PEPTIDE  TROPONIN I (HIGH SENSITIVITY)  TROPONIN I (HIGH SENSITIVITY)     MDM    COVID influenza testing is negative.  Mild hyponatremia.  Serial troponins are negative.  Patient with ongoing mild chest tightness with his cough.  BNP within normal limits.  No obvious findings of heart  failure exacerbation.  No significant wheezing on exam, have a low suspicion for COPD exacerbation.  Does have a productive cough.  On reevaluation Patient is chest pain-free.  No significant shortness of breath or cough.  Ambulating in the emergency department without any signs of respiratory distress or difficulties.  Serial troponins are negative.  Low suspicion for pulmonary embolism.  Discussed close follow-up with his cardiologist and given information to follow-up and a referral for primary care doctor.  Discussed return to the emergency department for any ongoing or worsening symptoms.  Patient states that he would return if his chest pain or shortness of breath returns.   PROCEDURES:  Critical Care performed: No  Procedures  Patient's presentation is most consistent with acute presentation with potential threat to life or bodily function.   MEDICATIONS ORDERED IN ED: Medications - No data to display  FINAL CLINICAL IMPRESSION(S) / ED DIAGNOSES   Final diagnoses:  Chest pain, unspecified type     Rx / DC Orders   ED Discharge Orders          Ordered    Ambulatory referral to Cardiology       Comments: If you have not heard from the Cardiology office within the next 72 hours please call [phone].   01/29/23 0208    Ambulatory Referral to Primary Care (Establish Care)        01/29/23 0208             Note:  This document was prepared using Dragon voice recognition software and may include unintentional dictation errors.   Suzanne Kirsch, MD 01/29/23 [phone]

## 2023-01-28 NOTE — ED Triage Notes (Signed)
 Pt presents to ED with 2 days worth of CP and SOB, pt unsure of CP HX. NAD noted. Pt states nasal congestion, pt states some chills, pt unsure of sick contacts.

## 2023-01-29 ENCOUNTER — Telehealth (HOSPITAL_COMMUNITY): Payer: Self-pay | Admitting: Licensed Clinical Social Worker

## 2023-01-29 ENCOUNTER — Encounter: Payer: Self-pay | Admitting: Hematology and Oncology

## 2023-01-29 LAB — BRAIN NATRIURETIC PEPTIDE: B Natriuretic Peptide: 26.8 pg/mL (ref 0.0–100.0)

## 2023-01-29 NOTE — Telephone Encounter (Addendum)
 H&V Care Navigation CSW Progress Note  Clinical Social Worker spoke with Wyatt Montgomery regarding housing and medicaid.  Able to complete Medicaid app over the phone and email to case worker to turn in to [facility].  Patient now reporting he has no where to go tonight.  States he got his check Wednesday but had to spend all of it to catch up on past due costs with hotel- this was not mentioned prior and hotel staff did not mention outstanding balance when we assisted in paying for two nights through patient care fund so unsure about this.  CSW provided with information regarding [facility] who per there website provide winter emergency shelter.  Patient will plan to go there tonight if hotel confirms they won't let him stay.   SDOH Screenings   Food Insecurity: Food Insecurity Present (09/29/2022)  Housing: High Risk (01/25/2023)  Transportation Needs: Unmet Transportation Needs (01/25/2023)  Utilities: Not At Risk (09/18/2022)  Financial Resource Strain: High Risk (09/29/2022)  Tobacco Use: High Risk (01/28/2023)    Andriette HILARIO Leech, LCSW Clinical Social Worker [facility] Desk#: [phone] Cell#: [phone]

## 2023-01-29 NOTE — Discharge Instructions (Addendum)
 You are seen in the emergency department for chest pain or shortness of breath.  Your chest x-ray did not show any signs of pneumonia.  You had 2 heart enzymes that were negative, do not believe you are having a heart attack today.  You had no findings of heart failure exacerbation.  You are given information to follow-up closely with your primary care physician.  Follow-up closely with your cardiologist.  If you have return of chest pain return to the emergency department.  Thank you for choosing us  for your health care, it was my pleasure to care for you today!  Clotilda Punter, MD

## 2023-02-01 ENCOUNTER — Telehealth (HOSPITAL_COMMUNITY): Payer: Self-pay | Admitting: Licensed Clinical Social Worker

## 2023-02-01 NOTE — Telephone Encounter (Signed)
 H&V Care Navigation CSW Progress Note  Clinical Social Worker called patient to check in regarding housing.  States he as able to stay in emergency winter shelter with [facility] on Friday night but that he was not allowed to stay past that.  States that he was taken in by a friend and that he is being allowed to stay for the forseeable future.  States that he will plan to go back to [employer] as instructed to complete homelessness assessment for possible case management services or longer term shelter placement.  CSW will continue to follow and assist as needed   SDOH Screenings   Food Insecurity: Food Insecurity Present (09/29/2022)  Housing: High Risk (01/25/2023)  Transportation Needs: Unmet Transportation Needs (01/25/2023)  Utilities: Not At Risk (09/18/2022)  Financial Resource Strain: High Risk (09/29/2022)  Tobacco Use: High Risk (01/28/2023)    Andriette HILARIO Leech, LCSW Clinical Social Worker [facility] Desk#: [phone] Cell#: [phone]

## 2023-02-04 ENCOUNTER — Encounter: Payer: Self-pay | Admitting: Hematology and Oncology

## 2023-02-04 ENCOUNTER — Other Ambulatory Visit (HOSPITAL_COMMUNITY): Payer: Self-pay

## 2023-02-04 ENCOUNTER — Other Ambulatory Visit: Payer: Self-pay

## 2023-02-04 ENCOUNTER — Telehealth: Payer: Self-pay | Admitting: Family

## 2023-02-04 MED ORDER — SPIRONOLACTONE 25 MG PO TABS
25.0000 mg | ORAL_TABLET | Freq: Every day | ORAL | 3 refills | Status: DC
  Filled 2023-02-04 (×2): qty 90, 90d supply, fill #0

## 2023-02-04 MED ORDER — VALSARTAN 80 MG PO TABS
80.0000 mg | ORAL_TABLET | Freq: Every day | ORAL | 3 refills | Status: DC
  Filled 2023-02-04 (×2): qty 90, 90d supply, fill #0

## 2023-02-04 MED ORDER — MENTHOL 5.8 MG MT LOZG
1.0000 | LOZENGE | OROMUCOSAL | 0 refills | Status: DC | PRN
  Filled 2023-02-04: qty 30, 30d supply, fill #0

## 2023-02-04 MED ORDER — FERROUS SULFATE 325 (65 FE) MG PO TABS
325.0000 mg | ORAL_TABLET | Freq: Every day | ORAL | 3 refills | Status: DC
  Filled 2023-02-04 (×2): qty 30, 30d supply, fill #0
  Filled 2023-03-16 (×2): qty 30, 30d supply, fill #1

## 2023-02-04 MED ORDER — AMLODIPINE BESYLATE 10 MG PO TABS
10.0000 mg | ORAL_TABLET | Freq: Every day | ORAL | 3 refills | Status: DC
  Filled 2023-02-04 (×2): qty 30, 30d supply, fill #0
  Filled 2023-03-16 (×2): qty 30, 30d supply, fill #1

## 2023-02-04 MED ORDER — FUROSEMIDE 20 MG PO TABS
20.0000 mg | ORAL_TABLET | Freq: Every day | ORAL | 5 refills | Status: DC
  Filled 2023-02-04 (×2): qty 30, 30d supply, fill #0
  Filled 2023-03-16 (×2): qty 30, 30d supply, fill #1

## 2023-02-04 NOTE — Telephone Encounter (Signed)
Patient set up for mail order.

## 2023-02-05 ENCOUNTER — Telehealth: Payer: Self-pay | Admitting: Pharmacist

## 2023-02-05 ENCOUNTER — Other Ambulatory Visit (HOSPITAL_COMMUNITY): Payer: Self-pay

## 2023-02-05 ENCOUNTER — Other Ambulatory Visit: Payer: Self-pay

## 2023-02-05 NOTE — Telephone Encounter (Signed)
Patient now signed up to receive medications via [facility] mail order due to difficulty with transportation.

## 2023-02-17 ENCOUNTER — Telehealth (HOSPITAL_COMMUNITY): Payer: Self-pay | Admitting: Licensed Clinical Social Worker

## 2023-02-17 NOTE — Telephone Encounter (Signed)
H&V Care Navigation CSW Progress Note  Clinical Social Worker received call from pt to touch base regarding getting premium for insurance waived- CSW has already submitting Medicaid app on his behalf- awaiting determination.  Patient states he called [employer] but they couldn't help him.  Patient looking for alternative housing as he states he can only stay where he is until March.  With income of only about $750 a month he needs to apply for income based housing- he plans to apply with [place] housing and other agencies ASAP.  CSW noticed pt has appt next week- pt confirms he needs transportation- CSW arranged [employer] to bring to appt. Pick up from current address at 11am.   SDOH Screenings   Food Insecurity: Food Insecurity Present (09/29/2022)  Housing: High Risk (01/25/2023)  Transportation Needs: Unmet Transportation Needs (02/17/2023)  Utilities: Not At Risk (09/18/2022)  Financial Resource Strain: High Risk (09/29/2022)  Tobacco Use: High Risk (01/28/2023)   Burna Sis, LCSW Clinical Social Worker [facility] Desk#: [phone] Cell#: [phone]

## 2023-02-24 NOTE — Progress Notes (Signed)
 [facility] Note    PCP: None Cardiologist:Keimon Basaldua NP  Chief Complaint: difficulty sleeping  HPI: Wyatt Montgomery is a 68 y/o male with a history of hyponatremia, polysubstance abuse, HTN, portal vein thrombosis, IDA, hypokalemia, alcohol use, chronic pancreatitis, pancreatitic cyst, chronic thrombocytosis and chronic HFpEF.    Admitted 09/17/22 due to chest pain, cough, shortness of breath and lower extremity swelling. He had nausea and vomiting the night prior to admission but he attributed this to drinking liquor. Diuresed. Treated with tolvaptan  due to hyponatremia. S/p IV iron  sucrose infusion on 09/18/2022 and 09/19/2022 due to anemia.   Was in the ED 01/28/23 due to chest pain/ shortness of breath for 2 days. ST depression with T wave inverted to the inferior and lateral leads.  No significant ST elevation. Repeat EKG obtained with no significant change from initial EKG. Chest x-ray no signs of pneumonia. No signs of pulmonary edema. Serial troponins are negative.   He presents today for a HF follow-up visit with a chief complaint of difficulty sleeping. Says that this is chronic in nature. Has occasional cough along with this. Denies fatigue, shortness of breath, chest pain, palpitations, abdominal distention, pedal edema, dizziness or weight gain.   Staying with his niece right now but is hoping to find his own place in the next couple of months.   Cardiac Imaging Echo 09/18/22: EF 50-55% with mild Wyatt  ROS: All systems negative except as listed in HPI, PMH and Problem List.  SH:  Social History   Socioeconomic History   Marital status: Married    Spouse name: Not on file   Number of children: 1   Years of education: Not on file   Highest education level: Not on file  Occupational History   Not on file  Tobacco Use   Smoking status: Heavy Smoker    Current packs/day: 0.50    Average packs/day: 0.5 packs/day for 20.0 years (10.0 ttl pk-yrs)    Types:  Cigarettes   Smokeless tobacco: Never   Tobacco comments:    decreased, 1/2 pack per week.  Vaping Use   Vaping status: Never Used  Substance and Sexual Activity   Alcohol use: Yes    Alcohol/week: 28.0 standard drinks of alcohol    Types: 28 Cans of beer per week   Drug use: Yes    Frequency: 1.0 times per week    Types: Crack cocaine    Comment: used 4 days prior to admission   Sexual activity: Not Currently    Partners: Female  Other Topics Concern   Not on file  Social History Narrative   Not on file   Social Drivers of Health   Financial Resource Strain: High Risk (09/29/2022)   Overall Financial Resource Strain (CARDIA)    Difficulty of Paying Living Expenses: Very hard  Food Insecurity: Food Insecurity Present (09/29/2022)   Hunger Vital Sign    Worried About Running Out of Food in the Last Year: Often true    Ran Out of Food in the Last Year: Often true  Transportation Needs: Unmet Transportation Needs (02/17/2023)   PRAPARE - Administrator, Civil Service (Medical): Yes    Lack of Transportation (Non-Medical): Yes  Physical Activity: Not on file  Stress: Not on file  Social Connections: Not on file  Intimate Partner Violence: Not At Risk (09/18/2022)   Humiliation, Afraid, Rape, and Kick questionnaire    Fear of Current or Ex-Partner: No  Emotionally Abused: No    Physically Abused: No    Sexually Abused: No    FH:  Family History  Problem Relation Age of Onset   Hypertension Father     Past Medical History:  Diagnosis Date   Pancreatic mass    Thrombosis, portal vein     Current Outpatient Medications  Medication Sig Dispense Refill   amLODipine  (NORVASC ) 10 MG tablet Take 1 tablet (10 mg total) by mouth daily. 30 tablet 3   ferrous sulfate  325 (65 FE) MG tablet Take 1 tablet (325 mg total) by mouth daily. 30 tablet 3   furosemide  (LASIX ) 20 MG tablet Take 1 tablet (20 mg total) by mouth daily. 30 tablet 5   Menthol  5.8 MG LOZG Use as  directed 1 lozenge (5.8 mg total) in the mouth or throat as needed. 30 lozenge 0   spironolactone  (ALDACTONE ) 25 MG tablet Take 1 tablet (25 mg total) by mouth daily. 90 tablet 3   valsartan  (DIOVAN ) 80 MG tablet Take 1 tablet (80 mg total) by mouth daily. 90 tablet 3   No current facility-administered medications for this visit.   Vitals:   02/25/23 1132  BP: 135/69  Pulse: 78  SpO2: 100%  Weight: 144 lb (65.3 kg)   Wt Readings from Last 3 Encounters:  02/25/23 144 lb (65.3 kg)  01/28/23 138 lb 14.2 oz (63 kg)  01/25/23 141 lb (64 kg)   Lab Results  Component Value Date   CREATININE 1.16 01/28/2023   CREATININE 1.12 11/03/2022   CREATININE 1.14 10/19/2022    PHYSICAL EXAM:  General: Well appearing. No resp difficulty HEENT: normal Neck: supple, no JVD Cor: Regular rhythm, rate. No rubs, gallops or murmurs Lungs: clear Abdomin: soft, nontender, nondistended. Extremities: no cyanosis, clubbing, rash, edema Neuro: alert & oriented X 3. Moves all 4 extremities w/o difficulty. Affect pleasant  ASSESSMENT & PLAN:  1. NICN with preserved ejection fraction- - suspect due to alcohol use - NYHA I - Appears euvolemic - weight up 3 pounds from last visit here 1 month ago - Echo 09/18/22: EF 50-55% with mild Wyatt - continue furosemide  20mg  daily - continue spironolactone  25mg  daily - continue valsartan  80mg  daily - BNP 01/28/23 reviewed and was 26.8  2.  HTN- - BP 135/69 - does not have PCP; will reach out to social worker to see if they can assist  - continue amlodipine  10mg  daily - BMET 01/28/23 reviewed and showed sodium 131, potassium 3.6, creatinine 1.16 & GFR >60  3.  Alcohol use- - drinking 12 oz beer daily; says that he used to drink a lot more - discussed complete cessation   4. Tobacco Abuse- - smoking 2 cigarettes daily - reviewed smoking cessation  5. SDOH- - now living with his niece for a couple more months - child psychotherapist assisting patient with  needs   Return in 2 months, sooner if needed.   Ellouise DELENA Class NP-C

## 2023-02-25 ENCOUNTER — Other Ambulatory Visit: Payer: Self-pay

## 2023-02-25 ENCOUNTER — Encounter: Payer: Self-pay | Admitting: Family

## 2023-02-25 ENCOUNTER — Ambulatory Visit: Payer: No Typology Code available for payment source | Attending: Family | Admitting: Family

## 2023-02-25 ENCOUNTER — Encounter: Payer: Self-pay | Admitting: Hematology and Oncology

## 2023-02-25 VITALS — BP 135/69 | HR 78 | Wt 144.0 lb

## 2023-02-25 DIAGNOSIS — Z5982 Transportation insecurity: Secondary | ICD-10-CM | POA: Diagnosis not present

## 2023-02-25 DIAGNOSIS — F17200 Nicotine dependence, unspecified, uncomplicated: Secondary | ICD-10-CM

## 2023-02-25 DIAGNOSIS — I110 Hypertensive heart disease with heart failure: Secondary | ICD-10-CM | POA: Diagnosis not present

## 2023-02-25 DIAGNOSIS — I5032 Chronic diastolic (congestive) heart failure: Secondary | ICD-10-CM | POA: Diagnosis not present

## 2023-02-25 DIAGNOSIS — F1011 Alcohol abuse, in remission: Secondary | ICD-10-CM

## 2023-02-25 DIAGNOSIS — F1090 Alcohol use, unspecified, uncomplicated: Secondary | ICD-10-CM | POA: Diagnosis not present

## 2023-02-25 DIAGNOSIS — F17210 Nicotine dependence, cigarettes, uncomplicated: Secondary | ICD-10-CM | POA: Diagnosis not present

## 2023-02-25 DIAGNOSIS — Z79899 Other long term (current) drug therapy: Secondary | ICD-10-CM | POA: Insufficient documentation

## 2023-02-25 DIAGNOSIS — K861 Other chronic pancreatitis: Secondary | ICD-10-CM | POA: Diagnosis not present

## 2023-02-25 DIAGNOSIS — Z5986 Financial insecurity: Secondary | ICD-10-CM | POA: Insufficient documentation

## 2023-02-25 DIAGNOSIS — I10 Essential (primary) hypertension: Secondary | ICD-10-CM

## 2023-02-25 DIAGNOSIS — G479 Sleep disorder, unspecified: Secondary | ICD-10-CM | POA: Diagnosis present

## 2023-02-25 MED ORDER — MENTHOL 5.8 MG MT LOZG
1.0000 | LOZENGE | OROMUCOSAL | 3 refills | Status: DC | PRN
  Filled 2023-02-25: qty 30, 30d supply, fill #0

## 2023-02-25 NOTE — Patient Instructions (Signed)
 It was good to see you today!

## 2023-03-01 ENCOUNTER — Telehealth (HOSPITAL_COMMUNITY): Payer: Self-pay | Admitting: Licensed Clinical Social Worker

## 2023-03-01 NOTE — Telephone Encounter (Signed)
 H&V Care Navigation CSW Progress Note  Clinical Social Worker consulted to assist pt in finding new PCP.  Pt with limited transportation so requested nearby clinic.  Closest option found:  [facility] Address: [address], [place], [place] [address] Phone: [phone]  Appt March 13th at 9:20am  Bring Picture ID, Insurance Cards, Piece of mail with current address.  CSW also confirmed with pt that he now has Medicaid- was mistakenly made [facility] so pt will need to call [facility] Case worker to inform that he is [facility] Resident so [place] can be transferred.  States that he was told that his check should no longer have money deducted for Medicare part B- will get first check since this new development later this week so will see if that change is active.   SDOH Screenings   Food Insecurity: Food Insecurity Present (09/29/2022)  Housing: High Risk (01/25/2023)  Transportation Needs: Unmet Transportation Needs (02/17/2023)  Utilities: Not At Risk (09/18/2022)  Financial Resource Strain: High Risk (09/29/2022)  Tobacco Use: High Risk (02/25/2023)    Denton Flakes, LCSW Clinical Social Worker [facility] Desk#: [phone] Cell#: [phone]

## 2023-03-11 ENCOUNTER — Encounter: Payer: Self-pay | Admitting: Hematology and Oncology

## 2023-03-16 ENCOUNTER — Other Ambulatory Visit: Payer: Self-pay

## 2023-03-16 ENCOUNTER — Other Ambulatory Visit (HOSPITAL_COMMUNITY): Payer: Self-pay

## 2023-03-16 ENCOUNTER — Encounter: Payer: Self-pay | Admitting: Hematology and Oncology

## 2023-03-19 ENCOUNTER — Other Ambulatory Visit (HOSPITAL_COMMUNITY): Payer: Self-pay

## 2023-03-23 ENCOUNTER — Other Ambulatory Visit: Payer: Self-pay

## 2023-03-23 ENCOUNTER — Other Ambulatory Visit (HOSPITAL_COMMUNITY): Payer: Self-pay

## 2023-03-26 ENCOUNTER — Other Ambulatory Visit: Payer: Self-pay

## 2023-03-29 ENCOUNTER — Telehealth (HOSPITAL_COMMUNITY): Payer: Self-pay | Admitting: Licensed Clinical Social Worker

## 2023-03-29 NOTE — Telephone Encounter (Signed)
 H&V Care Navigation CSW Progress Note  Clinical Social Worker attempted to call pt to remind of PCP appt on Wednesday- unable to reach or leave VM- will continue to attempt contact.   SDOH Screenings   Food Insecurity: Food Insecurity Present (09/29/2022)  Housing: High Risk (01/25/2023)  Transportation Needs: Unmet Transportation Needs (02/17/2023)  Utilities: Not At Risk (09/18/2022)  Financial Resource Strain: High Risk (09/29/2022)  Tobacco Use: High Risk (02/25/2023)   Burna Sis, LCSW Clinical Social Worker [facility] Desk#: [phone] Cell#: [phone]

## 2023-04-07 ENCOUNTER — Other Ambulatory Visit: Payer: Self-pay

## 2023-04-19 ENCOUNTER — Encounter: Payer: Self-pay | Admitting: Hematology and Oncology

## 2023-04-23 ENCOUNTER — Telehealth: Payer: Self-pay | Admitting: Family

## 2023-04-23 NOTE — Telephone Encounter (Incomplete)
 Called to confirm/remind patient of their appointment at the [facility] on 04/26/23***.   Appointment:   [] Confirmed  [] Left mess   [x] No answer/No voice mail  [] Phone not in service  Patient reminded to bring all medications and/or complete list.  Confirmed patient has transportation. Gave directions, instructed to utilize valet parking.

## 2023-04-24 NOTE — Progress Notes (Deleted)
 [facility] Note    PCP: None Cardiologist:Addalynne Golding NP  Chief Complaint: difficulty sleeping  HPI: Wyatt Montgomery is a 68 y/o male with a history of hyponatremia, polysubstance abuse, HTN, portal vein thrombosis, IDA, hypokalemia, alcohol use, chronic pancreatitis, pancreatitic cyst, chronic thrombocytosis and chronic HFpEF.    Admitted 09/17/22 due to chest pain, cough, shortness of breath and lower extremity swelling. He had nausea and vomiting the night prior to admission but he attributed this to drinking liquor. Diuresed. Treated with tolvaptan due to hyponatremia. S/p IV iron sucrose infusion on 09/18/2022 and 09/19/2022 due to anemia.   Was in the ED 01/28/23 due to chest pain/ shortness of breath for 2 days. ST depression with T wave inverted to the inferior and lateral leads.  No significant ST elevation. Repeat EKG obtained with no significant change from initial EKG. Chest x-ray no signs of pneumonia. No signs of pulmonary edema. Serial troponins are negative.   He presents today for a HF follow-up visit with a chief complaint of difficulty sleeping. Says that this is chronic in nature. Has occasional cough along with this. Denies fatigue, shortness of breath, chest pain, palpitations, abdominal distention, pedal edema, dizziness or weight gain.   Staying with his niece right now but is hoping to find his own place in the next couple of months.   Cardiac Imaging Echo 09/18/22: EF 50-55% with mild Wyatt  ROS: All systems negative except as listed in HPI, PMH and Problem List.  SH:  Social History   Socioeconomic History   Marital status: Married    Spouse name: Not on file   Number of children: 1   Years of education: Not on file   Highest education level: Not on file  Occupational History   Not on file  Tobacco Use   Smoking status: Heavy Smoker    Current packs/day: 0.50    Average packs/day: 0.5 packs/day for 20.0 years (10.0 ttl pk-yrs)    Types:  Cigarettes   Smokeless tobacco: Never   Tobacco comments:    decreased, 1/2 pack per week.  Vaping Use   Vaping status: Never Used  Substance and Sexual Activity   Alcohol use: Yes    Alcohol/week: 28.0 standard drinks of alcohol    Types: 28 Cans of beer per week   Drug use: Yes    Frequency: 1.0 times per week    Types: "Crack" cocaine    Comment: used 4 days prior to admission   Sexual activity: Not Currently    Partners: Female  Other Topics Concern   Not on file  Social History Narrative   Not on file   Social Drivers of Health   Financial Resource Strain: High Risk (09/29/2022)   Overall Financial Resource Strain (CARDIA)    Difficulty of Paying Living Expenses: Very hard  Food Insecurity: Food Insecurity Present (09/29/2022)   Hunger Vital Sign    Worried About Running Out of Food in the Last Year: Often true    Ran Out of Food in the Last Year: Often true  Transportation Needs: Unmet Transportation Needs (02/17/2023)   PRAPARE - Administrator, Civil Service (Medical): Yes    Lack of Transportation (Non-Medical): Yes  Physical Activity: Not on file  Stress: Not on file  Social Connections: Not on file  Intimate Partner Violence: Not At Risk (09/18/2022)   Humiliation, Afraid, Rape, and Kick questionnaire    Fear of Current or Ex-Partner: No  Emotionally Abused: No    Physically Abused: No    Sexually Abused: No    FH:  Family History  Problem Relation Age of Onset   Hypertension Father     Past Medical History:  Diagnosis Date   Pancreatic mass    Thrombosis, portal vein     Current Outpatient Medications  Medication Sig Dispense Refill   amLODipine (NORVASC) 10 MG tablet Take 1 tablet (10 mg total) by mouth daily. 30 tablet 3   ferrous sulfate 325 (65 FE) MG tablet Take 1 tablet (325 mg total) by mouth daily. 30 tablet 3   furosemide (LASIX) 20 MG tablet Take 1 tablet (20 mg total) by mouth daily. 30 tablet 5   spironolactone (ALDACTONE)  25 MG tablet Take 1 tablet (25 mg total) by mouth daily. 90 tablet 3   valsartan (DIOVAN) 80 MG tablet Take 1 tablet (80 mg total) by mouth daily. 90 tablet 3   No current facility-administered medications for this visit.   There were no vitals filed for this visit.  Wt Readings from Last 3 Encounters:  02/25/23 144 lb (65.3 kg)  01/28/23 138 lb 14.2 oz (63 kg)  01/25/23 141 lb (64 kg)   Lab Results  Component Value Date   CREATININE 1.16 01/28/2023   CREATININE 1.12 11/03/2022   CREATININE 1.14 10/19/2022    PHYSICAL EXAM:  General: Well appearing. No resp difficulty HEENT: normal Neck: supple, no JVD Cor: Regular rhythm, rate. No rubs, gallops or murmurs Lungs: clear Abdomin: soft, nontender, nondistended. Extremities: no cyanosis, clubbing, rash, edema Neuro: alert & oriented X 3. Moves all 4 extremities w/o difficulty. Affect pleasant  ASSESSMENT & PLAN:  1. NICN with preserved ejection fraction- - suspect due to alcohol use - NYHA I - Appears euvolemic - weight up 3 pounds from last visit here 1 month ago - Echo 09/18/22: EF 50-55% with mild Wyatt - continue furosemide 20mg  daily - continue spironolactone 25mg  daily - continue valsartan 80mg  daily - BNP 01/28/23 reviewed and was 26.8  2.  HTN- - BP 135/69 - does not have PCP; will reach out to social worker to see if they can assist  - continue amlodipine 10mg  daily - BMET 01/28/23 reviewed and showed sodium 131, potassium 3.6, creatinine 1.16 & GFR >60  3.  Alcohol use- - drinking 12 oz beer daily; says that he used to drink "a lot" more - discussed complete cessation   4. Tobacco Abuse- - smoking 2 cigarettes daily - reviewed smoking cessation  5. SDOH- - now living with his niece for a couple more months - Child psychotherapist assisting patient with needs   Return in 2 months, sooner if needed.   Delma Freeze NP-C

## 2023-04-26 ENCOUNTER — Telehealth: Payer: Self-pay

## 2023-04-26 ENCOUNTER — Encounter: Payer: Self-pay | Admitting: Hematology and Oncology

## 2023-04-26 ENCOUNTER — Encounter: Payer: Medicare Other | Admitting: Family

## 2023-04-26 ENCOUNTER — Other Ambulatory Visit (HOSPITAL_COMMUNITY): Payer: Self-pay

## 2023-04-26 ENCOUNTER — Telehealth (HOSPITAL_COMMUNITY): Payer: Self-pay | Admitting: Licensed Clinical Social Worker

## 2023-04-26 ENCOUNTER — Telehealth: Payer: Self-pay | Admitting: Family

## 2023-04-26 NOTE — Telephone Encounter (Signed)
 Patient did not show for his Heart Failure Clinic appointment on 04/26/23.

## 2023-04-26 NOTE — Telephone Encounter (Signed)
 Advanced Heart Failure Patient Advocate Encounter  Test billing for Wyatt Montgomery shows that this plan requires brand name, and has a copay of $1.60 for 90 days. Test billing for Wyatt Montgomery shows a copay of $4.80 for 90 day supply.  Burnell Blanks, CPhT Rx Patient Advocate Phone: [phone]

## 2023-04-29 NOTE — Telephone Encounter (Signed)
 H&V Care Navigation CSW Progress Note  Clinical Social Worker informed pt needing help with transport to upcoming appt.  Does not have benefits through his medicare plan.  Medicaid still not transferred to correct county per patient (needs to be moved from [place] to [place]).    CSW arranged [employer] to bring pt to appt.   SDOH Screenings   Food Insecurity: Food Insecurity Present (09/29/2022)  Housing: High Risk (01/25/2023)  Transportation Needs: Unmet Transportation Needs (02/17/2023)  Utilities: Not At Risk (09/18/2022)  Financial Resource Strain: High Risk (09/29/2022)  Tobacco Use: High Risk (02/25/2023)   04/29/2023  [name] DOB: 1955-06-12 MRN: [id number]   RIDER WAIVER AND RELEASE OF LIABILITY  For the purposes of helping with transportation needs, [facility] partners with outside transportation providers (taxi companies, [place], Catering manager.) to give [employer] patients or other approved people the choice of on-demand rides [employer] [facility]") to our buildings for non-emergency visits.  By using [employer], I, the person signing this document, on behalf of myself and/or any legal minors (in my care using the [employer]), agree:  Science writer given to me are supplied by independent, outside transportation providers who do not work for, or have any affiliation with, [employer]. [facility] is not a transportation company. [facility] [facility] has no control over the quality or safety of the rides I get using [employer]. [place] has no control over whether any outside ride will happen on time or not. [facility] gives no guarantee on the reliability, quality, safety, or availability on any rides, or that no mistakes will happen. I know and accept that traveling by vehicle (car, truck, SVU, Zenaida Niece, bus, taxi, etc.) has risks of serious injuries such as disability, being paralyzed, and death. I know and agree the risk of using [employer] is  mine alone, and not [employer]. Transport Services are provided "as is" and as are available. The transportation providers are in charge for all inspections and care of the vehicles used to provide these rides. I agree not to take legal action against [employer], its agents, employees, officers, directors, representatives, insurers, attorneys, assigns, successors, subsidiaries, and affiliates at any time for any reasons related directly or indirectly to using [employer]. I also agree not to take legal action against [facility] or its affiliates for any injury, death, or damage to property caused by or related to using [employer]. I have read this Waiver and Release of Liability, and I understand the terms used in it and their legal meaning. This Waiver is freely and voluntarily given with the understanding that my right (or any legal minors) to legal action against  relating to [employer] is knowingly given up to use these services.   I attest that I read the Ride Waiver and Release of Liability to [name], gave Mr. Hoak the opportunity to ask questions and answered the questions asked (if any). I affirm that QUINTYN DOMBEK then provided consent for assistance with transportation.     Burna Sis

## 2023-05-04 ENCOUNTER — Telehealth: Payer: Self-pay | Admitting: Family

## 2023-05-04 NOTE — Progress Notes (Unsigned)
 Advanced Heart Failure Clinic Note    PCP: None Cardiologist:Michal Strzelecki NP  Chief Complaint: fatigue  HPI: Wyatt Montgomery is a 68 y/o male with a history of hyponatremia, polysubstance abuse, HTN, portal vein thrombosis, IDA, hypokalemia, alcohol use, chronic pancreatitis, pancreatitic cyst, chronic thrombocytosis and chronic HFpEF.    Admitted 09/17/22 due to chest pain, cough, shortness of breath and lower extremity swelling. He had nausea and vomiting the night prior to admission but he attributed this to drinking liquor. Diuresed. Treated with tolvaptan due to hyponatremia. S/p IV iron sucrose infusion on 09/18/2022 and 09/19/2022 due to anemia.   Was in the ED 01/28/23 due to chest pain/ shortness of breath for 2 days. ST depression with T wave inverted to the inferior and lateral leads.  No significant ST elevation. Repeat EKG obtained with no significant change from initial EKG. Chest x-ray no signs of pneumonia. No signs of pulmonary edema. Serial troponins are negative.   He presents today for a HF follow-up visit with a chief complaint of minimal fatigue. Has chronic difficulty sleeping due to worrying. Wife passed away May 06, 2023. Denies shortness of breath, chest pain, palpitations, abdominal distention, pedal edema or dizziness. Has scales but hasn't been weighing daily.    Cardiac Imaging Echo 09/18/22: EF 50-55% with mild Wyatt  ROS: All systems negative except as listed in HPI, PMH and Problem List.  SH:  Social History   Socioeconomic History   Marital status: Married    Spouse name: Not on file   Number of children: 1   Years of education: Not on file   Highest education level: Not on file  Occupational History   Not on file  Tobacco Use   Smoking status: Heavy Smoker    Current packs/day: 0.50    Average packs/day: 0.5 packs/day for 20.0 years (10.0 ttl pk-yrs)    Types: Cigarettes   Smokeless tobacco: Never   Tobacco comments:    decreased, 1/2 pack per week.   Vaping Use   Vaping status: Never Used  Substance and Sexual Activity   Alcohol use: Yes    Alcohol/week: 28.0 standard drinks of alcohol    Types: 28 Cans of beer per week   Drug use: Yes    Frequency: 1.0 times per week    Types: "Crack" cocaine    Comment: used 4 days prior to admission   Sexual activity: Not Currently    Partners: Female  Other Topics Concern   Not on file  Social History Narrative   Not on file   Social Drivers of Health   Financial Resource Strain: High Risk (09/29/2022)   Overall Financial Resource Strain (CARDIA)    Difficulty of Paying Living Expenses: Very hard  Food Insecurity: Food Insecurity Present (09/29/2022)   Hunger Vital Sign    Worried About Programme researcher, broadcasting/film/video in the Last Year: Often true    Ran Out of Food in the Last Year: Often true  Transportation Needs: Unmet Transportation Needs (04/29/2023)   PRAPARE - Administrator, Civil Service (Medical): Yes    Lack of Transportation (Non-Medical): Yes  Physical Activity: Not on file  Stress: Not on file  Social Connections: Not on file  Intimate Partner Violence: Not At Risk (09/18/2022)   Humiliation, Afraid, Rape, and Kick questionnaire    Fear of Current or Ex-Partner: No    Emotionally Abused: No    Physically Abused: No    Sexually Abused: No    FH:  Family History  Problem Relation Age of Onset   Hypertension Father     Past Medical History:  Diagnosis Date   Pancreatic mass    Thrombosis, portal vein     Current Outpatient Medications  Medication Sig Dispense Refill   amLODipine (NORVASC) 10 MG tablet Take 1 tablet (10 mg total) by mouth daily. 30 tablet 3   ferrous sulfate 325 (65 FE) MG tablet Take 1 tablet (325 mg total) by mouth daily. 30 tablet 3   furosemide (LASIX) 20 MG tablet Take 1 tablet (20 mg total) by mouth daily. 30 tablet 5   spironolactone (ALDACTONE) 25 MG tablet Take 1 tablet (25 mg total) by mouth daily. 90 tablet 3   valsartan (DIOVAN) 80  MG tablet Take 1 tablet (80 mg total) by mouth daily. 90 tablet 3   No current facility-administered medications for this visit.   Vitals:   05/05/23 1110  BP: 120/73  Pulse: 78  SpO2: 100%  Weight: 142 lb (64.4 kg)   Wt Readings from Last 3 Encounters:  05/05/23 142 lb (64.4 kg)  02/25/23 144 lb (65.3 kg)  01/28/23 138 lb 14.2 oz (63 kg)   Lab Results  Component Value Date   CREATININE 1.39 (H) 05/05/2023   CREATININE 1.16 01/28/2023   CREATININE 1.12 11/03/2022    PHYSICAL EXAM:  General: Well appearing. No resp difficulty HEENT: normal Neck: supple, no JVD Cor: Regular rhythm, rate. No rubs, gallops or murmurs Lungs: clear Abdomen: soft, nontender, nondistended. Extremities: no cyanosis, clubbing, rash, edema Neuro: alert & oriented X 3. Moves all 4 extremities w/o difficulty. Affect pleasant   EKG: not done   ASSESSMENT & PLAN:  1. NICN with preserved ejection fraction- - suspect due to alcohol use - NYHA II - euvolemic today - weight down 2 pounds from last visit here 2 months ago - Echo 09/18/22: EF 50-55% with mild Wyatt - begin farxiga 10mg  daily - only take furosemide 20mg  PRN with overnight weight gain of >3 pounds or edema; start weighing daily - continue spironolactone 25mg  daily - continue valsartan 80mg  daily - BMET today - BNP 01/28/23 reviewed and was 26.8  2.  HTN- - BP 120/73 - missed PCP appt 04/25/2023 as his wife had died; phone number provided to [facility] as he says that he will call them to r/s and that he can walk to that office from his home - stop amlodipine as he's not been taking it consistently - BMET 01/28/23 reviewed and showed sodium 131, potassium 3.6, creatinine 1.16 & GFR >60 - BMET today  3.  Alcohol use- - drinking 12 oz beer daily; says that he used to drink "a lot" more - discussed complete cessation   4. Tobacco Abuse- - smoking 2 cigarettes daily - reviewed smoking cessation   Return in 1 month, sooner if needed.    Wyatt Console NP-C

## 2023-05-04 NOTE — Telephone Encounter (Incomplete)
 Called to confirm/remind patient of their appointment at the Advanced Heart Failure Clinic on 05/05/23***.   Appointment:   [] Confirmed  [] Left mess   [x] No answer/No voice mail  [] Phone not in service  Patient reminded to bring all medications and/or complete list.  Confirmed patient has transportation. Gave directions, instructed to utilize valet parking.

## 2023-05-05 ENCOUNTER — Other Ambulatory Visit
Admission: RE | Admit: 2023-05-05 | Discharge: 2023-05-05 | Disposition: A | Discharge status: Home / Self Care | Source: Ambulatory Visit | Attending: Family | Admitting: Family

## 2023-05-05 ENCOUNTER — Encounter: Payer: Self-pay | Admitting: Family

## 2023-05-05 ENCOUNTER — Other Ambulatory Visit (HOSPITAL_COMMUNITY): Payer: Self-pay

## 2023-05-05 ENCOUNTER — Other Ambulatory Visit: Payer: Self-pay

## 2023-05-05 ENCOUNTER — Encounter: Payer: Self-pay | Admitting: Hematology and Oncology

## 2023-05-05 ENCOUNTER — Ambulatory Visit: Payer: Self-pay | Admitting: Family

## 2023-05-05 VITALS — BP 120/73 | HR 78 | Wt 142.0 lb

## 2023-05-05 DIAGNOSIS — F1090 Alcohol use, unspecified, uncomplicated: Secondary | ICD-10-CM | POA: Diagnosis not present

## 2023-05-05 DIAGNOSIS — F17200 Nicotine dependence, unspecified, uncomplicated: Secondary | ICD-10-CM

## 2023-05-05 DIAGNOSIS — I10 Essential (primary) hypertension: Secondary | ICD-10-CM

## 2023-05-05 DIAGNOSIS — I5032 Chronic diastolic (congestive) heart failure: Secondary | ICD-10-CM

## 2023-05-05 LAB — BASIC METABOLIC PANEL WITH GFR
Anion gap: 7 (ref 5–15)
BUN: 17 mg/dL (ref 8–23)
CO2: 23 mmol/L (ref 22–32)
Calcium: 8.9 mg/dL (ref 8.9–10.3)
Chloride: 102 mmol/L (ref 98–111)
Creatinine, Ser: 1.39 mg/dL — ABNORMAL HIGH (ref 0.61–1.24)
GFR, Estimated: 56 mL/min — ABNORMAL LOW (ref >60)
Glucose, Bld: 101 mg/dL — ABNORMAL HIGH (ref 70–99)
Potassium: 4 mmol/L (ref 3.5–5.1)
Sodium: 132 mmol/L — ABNORMAL LOW (ref 135–145)

## 2023-05-05 MED ORDER — FERROUS SULFATE 325 (65 FE) MG PO TABS
325.0000 mg | ORAL_TABLET | Freq: Every day | ORAL | 3 refills | Status: AC
  Filled 2023-05-05: qty 30, 30d supply, fill #0

## 2023-05-05 MED ORDER — FUROSEMIDE 20 MG PO TABS
20.0000 mg | ORAL_TABLET | Freq: Every day | ORAL | 11 refills | Status: DC | PRN
  Filled 2023-05-05 (×2): qty 30, 30d supply, fill #0

## 2023-05-05 MED ORDER — VALSARTAN 80 MG PO TABS
80.0000 mg | ORAL_TABLET | Freq: Every day | ORAL | 11 refills | Status: DC
  Filled 2023-05-05 (×2): qty 30, 30d supply, fill #0

## 2023-05-05 MED ORDER — FARXIGA 10 MG PO TABS
10.0000 mg | ORAL_TABLET | Freq: Every day | ORAL | 11 refills | Status: DC
  Filled 2023-05-05: qty 30, 30d supply, fill #0

## 2023-05-05 MED ORDER — SPIRONOLACTONE 25 MG PO TABS
25.0000 mg | ORAL_TABLET | Freq: Every day | ORAL | 11 refills | Status: DC
  Filled 2023-05-05 (×2): qty 30, 30d supply, fill #0

## 2023-05-05 NOTE — Patient Instructions (Addendum)
 [facility]:  [address] [place], [place] [address] [phone]   Medication Changes:  STOP AMLODIPINE  START FARXIGA 10 MG ONCE DAILY  TAKE YOUR LASIX AS NEEDED WITH WEIGHT GAIN AND SWELLING (3 LBS OR MORE OVERNIGHT)   GO OVER TO THE PHARMACY TO PICK UP YOUR MEDICATIONS ONCE YOU'RE DONE WITH YOUR BLOODWORK.   Lab Work:  Go over to the MEDICAL MALL. Go pass the gift shop and have your blood work completed.  We will only call you if the results are abnormal or if the provider would like to make medication changes.   Follow-Up in: 1 MONTH WITH Shawnee Dellen, FNP.  At the Advanced Heart Failure Clinic, you and your health needs are our priority. We have a designated team specialized in the treatment of Heart Failure. This Care Team includes your primary Heart Failure Specialized Cardiologist (physician), Advanced Practice Providers (APPs- Physician Assistants and Nurse Practitioners), and Pharmacist who all work together to provide you with the care you need, when you need it.   You may see any of the following providers on your designated Care Team at your next follow up:  Dr. Jules Oar Dr. Peder Bourdon Dr. Alwin Baars Dr. Judyth Nunnery Shawnee Dellen, FNP Bevely Brush, RPH-CPP  Please be sure to bring in all your medications bottles to every appointment.   Need to Contact Us :  If you have any questions or concerns before your next appointment please send us  a message through [facility] or call our office at [phone].    TO LEAVE A MESSAGE FOR THE NURSE SELECT OPTION 2, PLEASE LEAVE A MESSAGE INCLUDING: YOUR NAME DATE OF BIRTH CALL BACK NUMBER REASON FOR CALL**this is important as we prioritize the call backs  YOU WILL RECEIVE A CALL BACK THE SAME DAY AS LONG AS YOU CALL BEFORE 4:00 PM

## 2023-05-05 NOTE — Addendum Note (Signed)
 Addended by: Yudith Norlander A on: 05/05/2023 03:05 PM   Modules accepted: Orders

## 2023-05-05 NOTE — Progress Notes (Signed)
 [facility] - HEART FAILURE CLINIC - PHARMACIST COUNSELING NOTE  Adherence Assessment  Do you ever forget to take your medication? [] Yes [x] No  Do you ever skip doses due to side effects?  No concerns   [] Yes [] No  Do you have trouble affording your medicines? [] Yes [] No  Are you ever unable to pick up your medication due to transportation difficulties? [] Yes [x] No  Do you ever stop taking your medications because you don't believe they are helping? [] Yes [x] No  Do you check your weight daily?  [] Yes [x] No  Do you check your blood pressure daily?  BP   [x] Yes [] No  Adherence strategy:  Keeps meds in the   Barriers to obtaining medications:    Vital signs: HR ***, BP ***, weight (pounds) *** ECHO: Date ***, EF ***, notes *** Cath: Date ***, EF ***, notes *** Renal function: Date ***, GFR ***  Current Guideline-Directed Medical Therapy/Evidence Based Medicine  ACE/ARB/ARNI: {AR_ARNI/ACEi/ARB:25599} Target dose:  Beta Blocker: {AR_Beta blocker:25598} Target dose:  Aldosterone Antagonist: {AR_Mineralocorticoid receptor antagonists:25602} Target dose:  SGLT2i: {AR_SGLT2i:25603} Target dose:   Diuretic: {AR_Diuretics:25604}  Others:   ASSESSMENT *** year old {Gender Description:210950033} who presents to the HF clinic ***. PMH is significant for ***.   Recent ED visit or hospitalization (past 6 months): Date - ***, CC - *** , Admission Dx (if applicable) - ***    COUNSELING POINTS/CLINICAL PEARLS  Can use the HFCMEDCOUNSELING dot phrase here   PLAN    Time spent: *** minutes

## 2023-05-06 ENCOUNTER — Other Ambulatory Visit: Payer: Self-pay

## 2023-05-06 ENCOUNTER — Encounter: Payer: Self-pay | Admitting: Hematology and Oncology

## 2023-05-17 ENCOUNTER — Other Ambulatory Visit: Payer: Self-pay

## 2023-06-02 ENCOUNTER — Encounter: Payer: Self-pay | Admitting: Hematology and Oncology

## 2023-06-08 ENCOUNTER — Telehealth: Payer: Self-pay | Admitting: Family

## 2023-06-08 NOTE — Telephone Encounter (Signed)
 Called to confirm/remind patient of their appointment at the Advanced Heart Failure Clinic on 06/09/23.   Appointment:   [] Confirmed  [] Left mess   [x] No answer/No voice mail  [] VM Full/unable to leave message  [] Phone not in service  Patient reminded to bring all medications and/or complete list.  Confirmed patient has transportation. Gave directions, instructed to utilize valet parking.

## 2023-06-08 NOTE — Progress Notes (Deleted)
 Advanced Heart Failure Clinic Note    PCP: None Cardiologist:Cathryn Gallery NP  Chief Complaint:    HPI:  Wyatt Montgomery is a 68 y/o male with a history of hyponatremia, polysubstance abuse, HTN, portal vein thrombosis, IDA, hypokalemia, alcohol use, chronic pancreatitis, pancreatitic cyst, chronic thrombocytosis and chronic HFpEF.    Admitted 09/17/22 due to chest pain, cough, shortness of breath and lower extremity swelling. He had nausea and vomiting the night prior to admission but he attributed this to drinking liquor. Diuresed. Treated with tolvaptan  due to hyponatremia. S/p IV iron  sucrose infusion on 09/18/2022 and 09/19/2022 due to anemia.   Was in the ED 01/28/23 due to chest pain/ shortness of breath for 2 days. ST depression with T wave inverted to the inferior and lateral leads.  No significant ST elevation. Repeat EKG obtained with no significant change from initial EKG. Chest x-ray no signs of pneumonia. No signs of pulmonary edema. Serial troponins are negative.   Seen in [facility] 04/25 and amlodipine  was stopped & farxiga  10mg  was started. Aaron Aas   He presents today for a HF follow-up visit with a chief complaint of   Cardiac Imaging Echo 09/18/22: EF 50-55% with mild Wyatt  ROS: All systems negative except as listed in HPI, PMH and Problem List.  SH:  Social History   Socioeconomic History   Marital status: Married    Spouse name: Not on file   Number of children: 1   Years of education: Not on file   Highest education level: Not on file  Occupational History   Not on file  Tobacco Use   Smoking status: Heavy Smoker    Current packs/day: 0.50    Average packs/day: 0.5 packs/day for 20.0 years (10.0 ttl pk-yrs)    Types: Cigarettes   Smokeless tobacco: Never   Tobacco comments:    decreased, 1/2 pack per week.  Vaping Use   Vaping status: Never Used  Substance and Sexual Activity   Alcohol use: Yes    Alcohol/week: 28.0 standard drinks of alcohol    Types: 28 Cans of  beer per week   Drug use: Yes    Frequency: 1.0 times per week    Types: "Crack" cocaine    Comment: used 4 days prior to admission   Sexual activity: Not Currently    Partners: Female  Other Topics Concern   Not on file  Social History Narrative   Not on file   Social Drivers of Health   Financial Resource Strain: High Risk (09/29/2022)   Overall Financial Resource Strain (CARDIA)    Difficulty of Paying Living Expenses: Very hard  Food Insecurity: Food Insecurity Present (09/29/2022)   Hunger Vital Sign    Worried About Programme researcher, broadcasting/film/video in the Last Year: Often true    Ran Out of Food in the Last Year: Often true  Transportation Needs: Unmet Transportation Needs (04/29/2023)   PRAPARE - Administrator, Civil Service (Medical): Yes    Lack of Transportation (Non-Medical): Yes  Physical Activity: Not on file  Stress: Not on file  Social Connections: Not on file  Intimate Partner Violence: Not At Risk (09/18/2022)   Humiliation, Afraid, Rape, and Kick questionnaire    Fear of Current or Ex-Partner: No    Emotionally Abused: No    Physically Abused: No    Sexually Abused: No    FH:  Family History  Problem Relation Age of Onset   Hypertension Father  Past Medical History:  Diagnosis Date   Pancreatic mass    Thrombosis, portal vein     Current Outpatient Medications  Medication Sig Dispense Refill   FARXIGA  10 MG TABS tablet Take 1 tablet (10 mg total) by mouth daily before breakfast. 30 tablet 11   ferrous sulfate  325 (65 FE) MG tablet Take 1 tablet (325 mg total) by mouth daily. 30 tablet 3   furosemide  (LASIX ) 20 MG tablet Take 1 tablet (20 mg total) by mouth daily as needed (With a weight gain of 3 lbs or more overnight or 5 lbs in 1 week). 30 tablet 11   spironolactone  (ALDACTONE ) 25 MG tablet Take 1 tablet (25 mg total) by mouth daily. 30 tablet 11   valsartan  (DIOVAN ) 80 MG tablet Take 1 tablet (80 mg total) by mouth daily. 30 tablet 11   No  current facility-administered medications for this visit.      PHYSICAL EXAM:  General: Well appearing. No resp difficulty HEENT: normal Neck: supple, no JVD Cor: Regular rhythm, rate. No rubs, gallops or murmurs Lungs: clear Abdomen: soft, nontender, nondistended. Extremities: no cyanosis, clubbing, rash, edema Neuro: alert & oriented X 3. Moves all 4 extremities w/o difficulty. Affect pleasant   EKG: not done   ASSESSMENT & PLAN:  1. NICN with preserved ejection fraction- - suspect due to alcohol use - NYHA II - euvolemic today - weight 142 pounds from last visit here 1 month ago - Echo 09/18/22: EF 50-55% with mild Wyatt - continue farxiga  10mg  daily - only take furosemide  20mg  PRN with overnight weight gain of >3 pounds or edema; start weighing daily - continue spironolactone  25mg  daily - continue valsartan  80mg  daily - BMET today - BNP 01/28/23 reviewed and was 26.8  2.  HTN- - BP  - missed PCP appt 04/19/2023 as his wife had died; phone number provided to [facility] as he says that he will call them to r/s and that he can walk to that office from his home - BMET 05/05/23 reviewed: sodium 132, potassium 4.0, creatinine 1.39 & GFR 56 - BMET today  3.  Alcohol use- - drinking 12 oz beer daily; says that he used to drink "a lot" more - discussed complete cessation   4. Tobacco Abuse- - smoking 2 cigarettes daily - reviewed smoking cessation     Charlette Console NP-C  06/09/23

## 2023-06-09 ENCOUNTER — Telehealth: Payer: Self-pay | Admitting: Licensed Clinical Social Worker

## 2023-06-09 ENCOUNTER — Encounter: Payer: Self-pay | Admitting: Hematology and Oncology

## 2023-06-09 ENCOUNTER — Other Ambulatory Visit (HOSPITAL_COMMUNITY): Payer: Self-pay

## 2023-06-09 ENCOUNTER — Other Ambulatory Visit: Payer: Self-pay

## 2023-06-09 ENCOUNTER — Encounter: Admitting: Family

## 2023-06-09 MED ORDER — FUROSEMIDE 20 MG PO TABS
20.0000 mg | ORAL_TABLET | Freq: Every day | ORAL | 11 refills | Status: AC | PRN
  Filled 2023-06-09: qty 30, 30d supply, fill #0

## 2023-06-09 MED ORDER — FARXIGA 10 MG PO TABS
10.0000 mg | ORAL_TABLET | Freq: Every day | ORAL | 11 refills | Status: AC
  Filled 2023-06-09: qty 30, 30d supply, fill #0
  Filled 2023-07-15: qty 30, 30d supply, fill #1
  Filled 2023-08-05 – 2023-08-07 (×2): qty 30, 30d supply, fill #2
  Filled 2023-09-16: qty 30, 30d supply, fill #3
  Filled 2023-10-18: qty 30, 30d supply, fill #4
  Filled 2023-11-17: qty 30, 30d supply, fill #5
  Filled 2023-12-10: qty 30, 30d supply, fill #6
  Filled 2024-01-05: qty 30, 30d supply, fill #7
  Filled 2024-02-16: qty 30, 30d supply, fill #8

## 2023-06-09 MED ORDER — SPIRONOLACTONE 25 MG PO TABS
25.0000 mg | ORAL_TABLET | Freq: Every day | ORAL | 11 refills | Status: AC
  Filled 2023-06-09: qty 30, 30d supply, fill #0
  Filled 2023-07-15: qty 30, 30d supply, fill #1
  Filled 2023-08-05 – 2023-08-07 (×2): qty 30, 30d supply, fill #2
  Filled 2023-09-29: qty 30, 30d supply, fill #3
  Filled 2023-10-18 – 2023-10-22 (×2): qty 30, 30d supply, fill #4
  Filled 2024-01-05 – 2024-01-25 (×2): qty 30, 30d supply, fill #5

## 2023-06-09 MED ORDER — VALSARTAN 80 MG PO TABS
80.0000 mg | ORAL_TABLET | Freq: Every day | ORAL | 11 refills | Status: AC
  Filled 2023-06-09: qty 30, 30d supply, fill #0
  Filled 2023-07-15: qty 30, 30d supply, fill #1
  Filled 2023-08-05 – 2023-08-07 (×2): qty 30, 30d supply, fill #2
  Filled 2024-01-05: qty 30, 30d supply, fill #3
  Filled 2024-01-24 – 2024-02-01 (×3): qty 30, 30d supply, fill #4
  Filled 2024-02-16 – 2024-02-24 (×2): qty 30, 30d supply, fill #5

## 2023-06-09 NOTE — Telephone Encounter (Signed)
 H&V Care Navigation CSW Progress Note  Clinical Social Worker received request for transportation to pt appt on 6/3. Shared it appears pt had previously been assisted with cabs since his insurance was not up to date with Medicare and Medicaid. Appears to be updated as a dual complete plan- provided CMA with number for transportation to give to pt, [phone], he has ample time to arrange this ride as well for coverage (at least 2 business days). Should he end up not having benefits or need further assistance- encouraged staff to reach out to our team.  Patient is participating in a Managed Medicaid Plan:  No, UHC Medicare and Medicaid.   SDOH Screenings   Food Insecurity: Food Insecurity Present (09/29/2022)  Housing: High Risk (01/25/2023)  Transportation Needs: Unmet Transportation Needs (04/29/2023)  Utilities: Not At Risk (09/18/2022)  Financial Resource Strain: High Risk (09/29/2022)  Tobacco Use: High Risk (05/05/2023)    Nathen Balder, MSW, LCSW Clinical Social Worker II [facility] Health Heart/Vascular Care Navigation  [phone]- work cell phone (preferred)

## 2023-06-18 ENCOUNTER — Telehealth (HOSPITAL_BASED_OUTPATIENT_CLINIC_OR_DEPARTMENT_OTHER): Payer: Self-pay | Admitting: Licensed Clinical Social Worker

## 2023-06-18 NOTE — Telephone Encounter (Signed)
 H&V Care Navigation CSW Progress Note  Clinical Social Worker contacted patient by phone to f/u and ensure pt had called for ride to appt next week. Current telephone number listed [phone] rang and then did not have voicemail or reach anyone, may be out of service. Should pt call clinic back rides can be scheduled at [phone] .  Patient is participating in a Managed Medicaid Plan:  No, UHC  Dual Complete  SDOH Screenings   Food Insecurity: Food Insecurity Present (09/29/2022)  Housing: High Risk (01/25/2023)  Transportation Needs: Unmet Transportation Needs (04/29/2023)  Utilities: Not At Risk (09/18/2022)  Financial Resource Strain: High Risk (09/29/2022)  Tobacco Use: High Risk (05/05/2023)    Nathen Balder, MSW, LCSW Clinical Social Worker II [facility] Health Heart/Vascular Care Navigation  [phone]- work cell phone (preferred)

## 2023-06-21 ENCOUNTER — Telehealth: Payer: Self-pay | Admitting: Licensed Clinical Social Worker

## 2023-06-21 ENCOUNTER — Encounter: Admitting: Family

## 2023-06-21 NOTE — Telephone Encounter (Signed)
 H&V Care Navigation CSW Progress Note  Clinical Social Worker contacted patient by phone to f/u again on transportation request. Not able to leave a voicemail again today. Remain available as needed- pt does have Dual Complete plan per chart, has benefits through that plan.  Patient is participating in a Managed Medicaid Plan:  No, UHC Medicare and Medicaid  SDOH Screenings   Food Insecurity: Food Insecurity Present (09/29/2022)  Housing: High Risk (01/25/2023)  Transportation Needs: Unmet Transportation Needs (04/29/2023)  Utilities: Not At Risk (09/18/2022)  Financial Resource Strain: High Risk (09/29/2022)  Tobacco Use: High Risk (05/05/2023)    Wyatt Montgomery, MSW, LCSW Clinical Social Worker II [facility] Health Heart/Vascular Care Navigation  [phone]- work cell phone (preferred)

## 2023-06-22 NOTE — Progress Notes (Unsigned)
 Advanced Heart Failure Clinic Note    PCP: None Cardiologist:Cathryn Gallery NP  Chief Complaint:    HPI:  Wyatt Montgomery is a 68 y/o male with a history of hyponatremia, polysubstance abuse, HTN, portal vein thrombosis, IDA, hypokalemia, alcohol use, chronic pancreatitis, pancreatitic cyst, chronic thrombocytosis and chronic HFpEF.    Admitted 09/17/22 due to chest pain, cough, shortness of breath and lower extremity swelling. He had nausea and vomiting the night prior to admission but he attributed this to drinking liquor. Diuresed. Treated with tolvaptan  due to hyponatremia. S/p IV iron  sucrose infusion on 09/18/2022 and 09/19/2022 due to anemia.   Was in the ED 01/28/23 due to chest pain/ shortness of breath for 2 days. ST depression with T wave inverted to the inferior and lateral leads.  No significant ST elevation. Repeat EKG obtained with no significant change from initial EKG. Chest x-ray no signs of pneumonia. No signs of pulmonary edema. Serial troponins are negative.   Seen in [facility] 04/25 and amlodipine  was stopped & farxiga  10mg  was started. Wyatt Montgomery   He presents today for a HF follow-up visit with a chief complaint of   Cardiac Imaging Echo 09/18/22: EF 50-55% with mild Wyatt  ROS: All systems negative except as listed in HPI, PMH and Problem List.  SH:  Social History   Socioeconomic History   Marital status: Married    Spouse name: Not on file   Number of children: 1   Years of education: Not on file   Highest education level: Not on file  Occupational History   Not on file  Tobacco Use   Smoking status: Heavy Smoker    Current packs/day: 0.50    Average packs/day: 0.5 packs/day for 20.0 years (10.0 ttl pk-yrs)    Types: Cigarettes   Smokeless tobacco: Never   Tobacco comments:    decreased, 1/2 pack per week.  Vaping Use   Vaping status: Never Used  Substance and Sexual Activity   Alcohol use: Yes    Alcohol/week: 28.0 standard drinks of alcohol    Types: 28 Cans of  beer per week   Drug use: Yes    Frequency: 1.0 times per week    Types: "Crack" cocaine    Comment: used 4 days prior to admission   Sexual activity: Not Currently    Partners: Female  Other Topics Concern   Not on file  Social History Narrative   Not on file   Social Drivers of Health   Financial Resource Strain: High Risk (09/29/2022)   Overall Financial Resource Strain (CARDIA)    Difficulty of Paying Living Expenses: Very hard  Food Insecurity: Food Insecurity Present (09/29/2022)   Hunger Vital Sign    Worried About Programme researcher, broadcasting/film/video in the Last Year: Often true    Ran Out of Food in the Last Year: Often true  Transportation Needs: Unmet Transportation Needs (04/29/2023)   PRAPARE - Administrator, Civil Service (Medical): Yes    Lack of Transportation (Non-Medical): Yes  Physical Activity: Not on file  Stress: Not on file  Social Connections: Not on file  Intimate Partner Violence: Not At Risk (09/18/2022)   Humiliation, Afraid, Rape, and Kick questionnaire    Fear of Current or Ex-Partner: No    Emotionally Abused: No    Physically Abused: No    Sexually Abused: No    FH:  Family History  Problem Relation Age of Onset   Hypertension Father  Past Medical History:  Diagnosis Date   Pancreatic mass    Thrombosis, portal vein     Current Outpatient Medications  Medication Sig Dispense Refill   FARXIGA  10 MG TABS tablet Take 1 tablet (10 mg total) by mouth daily before breakfast. 30 tablet 11   ferrous sulfate  325 (65 FE) MG tablet Take 1 tablet (325 mg total) by mouth daily. 30 tablet 3   furosemide  (LASIX ) 20 MG tablet Take 1 tablet (20 mg total) by mouth daily as needed (With a weight gain of 3 lbs or more overnight or 5 lbs in 1 week). 30 tablet 11   spironolactone  (ALDACTONE ) 25 MG tablet Take 1 tablet (25 mg total) by mouth daily. 30 tablet 11   valsartan  (DIOVAN ) 80 MG tablet Take 1 tablet (80 mg total) by mouth daily. 30 tablet 11   No  current facility-administered medications for this visit.      PHYSICAL EXAM:  General: Well appearing. No resp difficulty HEENT: normal Neck: supple, no JVD Cor: Regular rhythm, rate. No rubs, gallops or murmurs Lungs: clear Abdomen: soft, nontender, nondistended. Extremities: no cyanosis, clubbing, rash, edema Neuro: alert & oriented X 3. Moves all 4 extremities w/o difficulty. Affect pleasant   EKG: not done   ASSESSMENT & PLAN:  1. NICN with preserved ejection fraction- - suspect due to alcohol use - NYHA II - euvolemic today - weight 142 pounds from last visit here 1 month ago - Echo 09/18/22: EF 50-55% with mild Wyatt - continue farxiga  10mg  daily - only take furosemide  20mg  PRN with overnight weight gain of >3 pounds or edema; start weighing daily - continue spironolactone  25mg  daily - continue valsartan  80mg  daily - BMET today - BNP 01/28/23 reviewed and was 26.8  2.  HTN- - BP  - missed PCP appt 04/19/2023 as his wife had died; phone number provided to [facility] as he says that he will call them to r/s and that he can walk to that office from his home - BMET 05/05/23 reviewed: sodium 132, potassium 4.0, creatinine 1.39 & GFR 56 - BMET today  3.  Alcohol use- - drinking 12 oz beer daily; says that he used to drink "a lot" more - discussed complete cessation   4. Tobacco Abuse- - smoking 2 cigarettes daily - reviewed smoking cessation     Charlette Console NP-C  06/09/23

## 2023-06-23 ENCOUNTER — Encounter: Payer: Self-pay | Admitting: Cardiology

## 2023-06-23 ENCOUNTER — Ambulatory Visit: Attending: Family | Admitting: Family

## 2023-06-23 ENCOUNTER — Encounter: Payer: Self-pay | Admitting: Family

## 2023-06-23 VITALS — BP 151/79 | HR 71 | Wt 142.0 lb

## 2023-06-23 DIAGNOSIS — I110 Hypertensive heart disease with heart failure: Secondary | ICD-10-CM | POA: Insufficient documentation

## 2023-06-23 DIAGNOSIS — K861 Other chronic pancreatitis: Secondary | ICD-10-CM | POA: Diagnosis not present

## 2023-06-23 DIAGNOSIS — Z5986 Financial insecurity: Secondary | ICD-10-CM | POA: Diagnosis not present

## 2023-06-23 DIAGNOSIS — I10 Essential (primary) hypertension: Secondary | ICD-10-CM

## 2023-06-23 DIAGNOSIS — F17210 Nicotine dependence, cigarettes, uncomplicated: Secondary | ICD-10-CM | POA: Diagnosis not present

## 2023-06-23 DIAGNOSIS — Z7984 Long term (current) use of oral hypoglycemic drugs: Secondary | ICD-10-CM | POA: Insufficient documentation

## 2023-06-23 DIAGNOSIS — M79604 Pain in right leg: Secondary | ICD-10-CM | POA: Diagnosis present

## 2023-06-23 DIAGNOSIS — I428 Other cardiomyopathies: Secondary | ICD-10-CM | POA: Diagnosis not present

## 2023-06-23 DIAGNOSIS — F17200 Nicotine dependence, unspecified, uncomplicated: Secondary | ICD-10-CM

## 2023-06-23 DIAGNOSIS — Z5982 Transportation insecurity: Secondary | ICD-10-CM | POA: Diagnosis not present

## 2023-06-23 DIAGNOSIS — I340 Nonrheumatic mitral (valve) insufficiency: Secondary | ICD-10-CM | POA: Diagnosis not present

## 2023-06-23 DIAGNOSIS — D509 Iron deficiency anemia, unspecified: Secondary | ICD-10-CM | POA: Diagnosis not present

## 2023-06-23 DIAGNOSIS — Z79899 Other long term (current) drug therapy: Secondary | ICD-10-CM | POA: Insufficient documentation

## 2023-06-23 DIAGNOSIS — I5032 Chronic diastolic (congestive) heart failure: Secondary | ICD-10-CM | POA: Insufficient documentation

## 2023-06-23 DIAGNOSIS — F1090 Alcohol use, unspecified, uncomplicated: Secondary | ICD-10-CM | POA: Diagnosis not present

## 2023-06-23 NOTE — Progress Notes (Signed)
 H&V Care Navigation CSW Progress Note  Clinical Social Worker received call from [place] [facility] clinic to assist pt with ride home. Pt had scheduled rides through his insurance benefits- called [phone] and selected option 1 and stated "return ride". Let staff know of pick up time, number on file should still be active at this time also per staff. Will mail transportation information to pt home address for future reference as well.  2:15pm- challenges with getting pt ride benefits through Medicare to pick him up at this time- this is further complicated by the fact that the pt does not have his cell phone with him for updates from transportation. Coordinated a cab ride with Marco Severs to pick pt up; they will call staff when they have arrived since pt does not have cell phone. Voucher sent to [place], Charity fundraiser and coordinated with [employer], CMA.   3:00pm- checked in with clinic staff to see if pt was picked up- they share another patient took he and his friend home. They have called Emmit Harold to cancel ride. I have contacted team lead to update her as well.   Pt has completed waiver (scanned into media) 06/23/2023  Dominick Fries DOB: Sep 05, 1955 MRN: [id number]   RIDER WAIVER AND RELEASE OF LIABILITY  For the purposes of helping with transportation needs, [facility] partners with outside transportation providers (taxi companies, [place], Catering manager.) to give [facility] patients or other approved people the choice of on-demand rides [employer]") to our buildings for non-emergency visits.  By using [employer], I, the person signing this document, on behalf of myself and/or any legal minors (in my care using the [employer]), agree:  Science writer given to me are supplied by independent, outside transportation providers who do not work for, or have any affiliation with, [employer]. [place] is not a transportation company. [facility] has no control over the quality or  safety of the rides I get using [employer]. [place] has no control over whether any outside ride will happen on time or not. [facility] gives no guarantee on the reliability, quality, safety, or availability on any rides, or that no mistakes will happen. I know and accept that traveling by vehicle (car, truck, SVU, [place] Chi, bus, taxi, etc.) has risks of serious injuries such as disability, being paralyzed, and death. I know and agree the risk of using [employer] is mine alone, and not [employer]. Transport Services are provided "as is" and as are available. The transportation providers are in charge for all inspections and care of the vehicles used to provide these rides. I agree not to take legal action against [facility], its agents, employees, officers, directors, representatives, insurers, attorneys, assigns, successors, subsidiaries, and affiliates at any time for any reasons related directly or indirectly to using [employer]. I also agree not to take legal action against [facility] or its affiliates for any injury, death, or damage to property caused by or related to using [employer]. I have read this Waiver and Release of Liability, and I understand the terms used in it and their legal meaning. This Waiver is freely and voluntarily given with the understanding that my right (or any legal minors) to legal action against  relating to [employer] is knowingly given up to use these services.   I attest that I read the Ride Waiver and Release of Liability to Dominick Fries, gave Mr. Dolley the opportunity to ask questions and answered the questions  asked (if any). I affirm that MIVAAN CORBITT then provided consent for assistance with transportation.     Charlette Console FNP                              Patient is participating in a Managed Medicaid Plan:  No, UHC Medicare and Medicaid Dual Complete  SDOH Screenings   Food Insecurity:  Food Insecurity Present (09/29/2022)  Housing: High Risk (01/25/2023)  Transportation Needs: Unmet Transportation Needs (06/23/2023)  Utilities: Not At Risk (09/18/2022)  Financial Resource Strain: High Risk (09/29/2022)  Tobacco Use: High Risk (05/05/2023)    Nathen Balder, MSW, LCSW Clinical Social Worker II [facility] Heart/Vascular Care Navigation  [phone]- work cell phone (preferred)

## 2023-06-23 NOTE — Patient Instructions (Signed)
 Increase your current valsartan  to 2 tablets every morning.    Decrease beer to 2 twelve ounce cans daily. Your total fluid intake for the day needs to be between 60-64 ounces. This includes your alcohol.

## 2023-07-14 ENCOUNTER — Telehealth: Payer: Self-pay | Admitting: Family

## 2023-07-14 NOTE — Telephone Encounter (Signed)
 Called to confirm/remind patient of their appointment at the Advanced Heart Failure Clinic on 07/15/23.   Appointment:   [] Confirmed  [] Left mess   [x] No answer/No voice mail  [] VM Full/unable to leave message  [] Phone not in service  Patient reminded to bring all medications and/or complete list.  Confirmed patient has transportation. Gave directions, instructed to utilize valet parking.

## 2023-07-15 ENCOUNTER — Other Ambulatory Visit (HOSPITAL_COMMUNITY): Payer: Self-pay

## 2023-07-15 ENCOUNTER — Encounter: Payer: Self-pay | Admitting: Family

## 2023-07-15 ENCOUNTER — Ambulatory Visit: Attending: Family | Admitting: Family

## 2023-07-15 VITALS — BP 134/76 | HR 88 | Wt 141.0 lb

## 2023-07-15 DIAGNOSIS — Z5986 Financial insecurity: Secondary | ICD-10-CM | POA: Insufficient documentation

## 2023-07-15 DIAGNOSIS — F17210 Nicotine dependence, cigarettes, uncomplicated: Secondary | ICD-10-CM | POA: Diagnosis not present

## 2023-07-15 DIAGNOSIS — Z5982 Transportation insecurity: Secondary | ICD-10-CM | POA: Diagnosis not present

## 2023-07-15 DIAGNOSIS — Z7984 Long term (current) use of oral hypoglycemic drugs: Secondary | ICD-10-CM | POA: Insufficient documentation

## 2023-07-15 DIAGNOSIS — K861 Other chronic pancreatitis: Secondary | ICD-10-CM | POA: Diagnosis not present

## 2023-07-15 DIAGNOSIS — I10 Essential (primary) hypertension: Secondary | ICD-10-CM

## 2023-07-15 DIAGNOSIS — I110 Hypertensive heart disease with heart failure: Secondary | ICD-10-CM | POA: Insufficient documentation

## 2023-07-15 DIAGNOSIS — D509 Iron deficiency anemia, unspecified: Secondary | ICD-10-CM | POA: Diagnosis not present

## 2023-07-15 DIAGNOSIS — I5032 Chronic diastolic (congestive) heart failure: Secondary | ICD-10-CM | POA: Insufficient documentation

## 2023-07-15 DIAGNOSIS — I428 Other cardiomyopathies: Secondary | ICD-10-CM | POA: Diagnosis present

## 2023-07-15 DIAGNOSIS — F1090 Alcohol use, unspecified, uncomplicated: Secondary | ICD-10-CM

## 2023-07-15 DIAGNOSIS — F17200 Nicotine dependence, unspecified, uncomplicated: Secondary | ICD-10-CM

## 2023-07-15 DIAGNOSIS — Z79899 Other long term (current) drug therapy: Secondary | ICD-10-CM | POA: Diagnosis not present

## 2023-07-15 NOTE — Patient Instructions (Addendum)
   Lab Work:  Go DOWN to LOWER LEVEL (LL) to have your blood work completed inside of [employer] office.  We will only call you if the results are abnormal or if the provider would like to make medication changes.   Special Instructions // Education:  Call to schedule primary care appointment:   [facility] [id number]   [phone]  Follow-Up in: 2 months   At the Advanced Heart Failure Clinic, you and your health needs are our priority. We have a designated team specialized in the treatment of Heart Failure. This Care Team includes your primary Heart Failure Specialized Cardiologist (physician), Advanced Practice Providers (APPs- Physician Assistants and Nurse Practitioners), and Pharmacist who all work together to provide you with the care you need, when you need it.   You may see any of the following providers on your designated Care Team at your next follow up:  Dr. Toribio Fuel Dr. Ezra Shuck Dr. Ria Commander Dr. Odis Brownie Ellouise Class, FNP Jaun Bash, RPH-CPP  Please be sure to bring in all your medications bottles to every appointment.   Need to Contact Us :  If you have any questions or concerns before your next appointment please send us  a message through [place] or call our office at [phone].    TO LEAVE A MESSAGE FOR THE NURSE SELECT OPTION 2, PLEASE LEAVE A MESSAGE INCLUDING: YOUR NAME DATE OF BIRTH CALL BACK NUMBER REASON FOR CALL**this is important as we prioritize the call backs  YOU WILL RECEIVE A CALL BACK THE SAME DAY AS LONG AS YOU CALL BEFORE 4:00 PM

## 2023-07-15 NOTE — Progress Notes (Signed)
 Advanced Heart Failure Clinic Note     PCP: plans to get established w/ [facility] on [employer] Cardiologist: none  Chief Complaint: HF visit   HPI:  Wyatt Montgomery is a 68 y/o male with a history of hyponatremia, polysubstance abuse, HTN, portal vein thrombosis, IDA, hypokalemia, alcohol use, chronic pancreatitis, pancreatitic cyst, chronic thrombocytosis and chronic HFpEF.    Admitted 09/17/22 due to chest pain, cough, shortness of breath and lower extremity swelling. He had nausea and vomiting the night prior to admission but he attributed this to drinking liquor. Diuresed. Treated with tolvaptan  due to hyponatremia. S/p IV iron  sucrose infusion on 09/18/2022 and 09/19/2022 due to anemia.   Was in the ED 01/28/23 due to chest pain/ shortness of breath for 2 days. ST depression with T wave inverted to the inferior and lateral leads.  No significant ST elevation. Repeat EKG obtained with no significant change from initial EKG. Chest x-ray no signs of pneumonia. No signs of pulmonary edema. Serial troponins are negative.   Seen in [facility] 04/25 and amlodipine  was stopped & farxiga  10mg  was started. .  Seen in [facility] 06/25 where valsartan  was increased to 160mg  daily and fluid restriction was reviewed.   He presents today for with a chief complaint of a HF visit. Denies shortness of breath, fatigue, chest pain, palpitations, abdominal distention, pedal edema, dizziness, swelling or difficulty sleeping. Walking to [employer] daily which is ~ 1/2 mile. Getting meds mailed from [facility].   Eating less salt (less hot sauce). Drinking 32 oz water, 40 oz beer daily. (Was drinking ~ 80 oz beer daily). Didn't drink any etoh yesterday.   Cardiac Imaging Echo 09/18/22: EF 50-55% with mild Wyatt  ROS: All systems negative except as listed in HPI, PMH and Problem List.  SH:  Social History   Socioeconomic History   Marital status: Married    Spouse name: Not on file   Number of children: 1    Years of education: Not on file   Highest education level: Not on file  Occupational History   Not on file  Tobacco Use   Smoking status: Heavy Smoker    Current packs/day: 0.50    Average packs/day: 0.5 packs/day for 20.0 years (10.0 ttl pk-yrs)    Types: Cigarettes   Smokeless tobacco: Never   Tobacco comments:    decreased, 1/2 pack per week.  Vaping Use   Vaping status: Never Used  Substance and Sexual Activity   Alcohol use: Yes    Alcohol/week: 28.0 standard drinks of alcohol    Types: 28 Cans of beer per week   Drug use: Yes    Frequency: 1.0 times per week    Types: Crack cocaine    Comment: used 4 days prior to admission   Sexual activity: Not Currently    Partners: Female  Other Topics Concern   Not on file  Social History Narrative   Not on file   Social Drivers of Health   Financial Resource Strain: High Risk (09/29/2022)   Overall Financial Resource Strain (CARDIA)    Difficulty of Paying Living Expenses: Very hard  Food Insecurity: Food Insecurity Present (09/29/2022)   Hunger Vital Sign    Worried About Running Out of Food in the Last Year: Often true    Ran Out of Food in the Last Year: Often true  Transportation Needs: Unmet Transportation Needs (06/23/2023)   PRAPARE - Administrator, Civil Service (Medical): Yes  Lack of Transportation (Non-Medical): Yes  Physical Activity: Not on file  Stress: Not on file  Social Connections: Not on file  Intimate Partner Violence: Not At Risk (09/18/2022)   Humiliation, Afraid, Rape, and Kick questionnaire    Fear of Current or Ex-Partner: No    Emotionally Abused: No    Physically Abused: No    Sexually Abused: No    FH:  Family History  Problem Relation Age of Onset   Hypertension Father     Past Medical History:  Diagnosis Date   Pancreatic mass    Thrombosis, portal vein     Current Outpatient Medications  Medication Sig Dispense Refill   FARXIGA  10 MG TABS tablet Take 1 tablet (10  mg total) by mouth daily before breakfast. 30 tablet 11   ferrous sulfate  325 (65 FE) MG tablet Take 1 tablet (325 mg total) by mouth daily. 30 tablet 3   furosemide  (LASIX ) 20 MG tablet Take 1 tablet (20 mg total) by mouth daily as needed (With a weight gain of 3 lbs or more overnight or 5 lbs in 1 week). 30 tablet 11   spironolactone  (ALDACTONE ) 25 MG tablet Take 1 tablet (25 mg total) by mouth daily. 30 tablet 11   valsartan  (DIOVAN ) 80 MG tablet Take 1 tablet (80 mg total) by mouth daily. (Patient taking differently: Take 160 mg by mouth daily.) 30 tablet 11   No current facility-administered medications for this visit.   Vitals:   07/15/23 0923  BP: 134/76  Pulse: 88  SpO2: 100%  Weight: 141 lb (64 kg)   Wt Readings from Last 3 Encounters:  07/15/23 141 lb (64 kg)  06/23/23 142 lb (64.4 kg)  05/05/23 142 lb (64.4 kg)   Lab Results  Component Value Date   CREATININE 1.39 (H) 05/05/2023   CREATININE 1.16 01/28/2023   CREATININE 1.12 11/03/2022    PHYSICAL EXAM:  General: Well appearing. No resp difficulty HEENT: normal Neck: supple, no JVD Cor: Regular rhythm, rate. No rubs, gallops or murmurs Lungs: clear Abdomen: soft, nontender, nondistended. Extremities: no cyanosis, clubbing, rash, edema Neuro: alert & oriented X 3. Moves all 4 extremities w/o difficulty. Affect pleasant   EKG: not done   ASSESSMENT & PLAN:  1. NICM with preserved ejection fraction- - suspect due to alcohol use - NYHA I - euvolemic today - weight stable from last visit here 3 weeks ago - Echo 09/18/22: EF 50-55% with mild Wyatt - continue farxiga  10mg  daily - continue furosemide  20mg  PRN ; hasn't had to take this since last here - continue spironolactone  25mg  daily - continue valsartan  160mg  daily - is drinking less fluids daily; now down to ~ 72 oz daily (was drinking >100 oz) - BMET today - BNP 01/28/23 reviewed and was 26.8  2.  HTN- - BP 134/76; improved since valsartan  was  increased - continue valsartan , spironolactone  - missed PCP appt 05/02/23 as his wife had died; he called [facility] who told him to call back July due to availability. Their contact info provided on AVS.  - BMET 05/05/23 reviewed: sodium 132, potassium 4.0, creatinine 1.39 & GFR 56 - BMET today  3.  Alcohol use- - drinking 40 oz beer daily; was drinking up to 80 oz daily. Did not drink any alcohol yesterday.  - discussed complete cessation   4. Tobacco Abuse- - smoking 1-2 cigarettes daily - reviewed smoking cessation   Return in 2 months to see HF MF, sooner if needed.  Ellouise DELENA Class NP-C  07/15/23

## 2023-07-16 ENCOUNTER — Ambulatory Visit: Payer: Self-pay | Admitting: Family

## 2023-07-16 LAB — BASIC METABOLIC PANEL WITH GFR
BUN/Creatinine Ratio: 11 (ref 10–24)
BUN: 14 mg/dL (ref 8–27)
CO2: 17 mmol/L — ABNORMAL LOW (ref 20–29)
Calcium: 9.3 mg/dL (ref 8.6–10.2)
Chloride: 103 mmol/L (ref 96–106)
Creatinine, Ser: 1.25 mg/dL (ref 0.76–1.27)
Glucose: 53 mg/dL — ABNORMAL LOW (ref 70–99)
Potassium: 4.4 mmol/L (ref 3.5–5.2)
Sodium: 134 mmol/L (ref 134–144)
eGFR: 64 mL/min/{1.73_m2} (ref >59)

## 2023-07-27 ENCOUNTER — Encounter: Payer: Self-pay | Admitting: Hematology and Oncology

## 2023-08-05 ENCOUNTER — Other Ambulatory Visit (HOSPITAL_COMMUNITY): Payer: Self-pay

## 2023-08-09 ENCOUNTER — Other Ambulatory Visit (HOSPITAL_COMMUNITY): Payer: Self-pay

## 2023-08-10 ENCOUNTER — Other Ambulatory Visit (HOSPITAL_COMMUNITY): Payer: Self-pay

## 2023-09-16 ENCOUNTER — Other Ambulatory Visit (HOSPITAL_COMMUNITY): Payer: Self-pay

## 2023-09-17 ENCOUNTER — Other Ambulatory Visit (HOSPITAL_COMMUNITY): Payer: Self-pay

## 2023-09-17 ENCOUNTER — Telehealth: Payer: Self-pay | Admitting: Family

## 2023-09-17 NOTE — Telephone Encounter (Signed)
 Called to confirm/remind patient of their appointment at the Advanced Heart Failure Clinic on 09/21/23.   Appointment:   [x] Confirmed  [] Left mess   [] No answer/No voice mail  [] VM Full/unable to leave message  [] Phone not in service  Patient reminded to bring all medications and/or complete list.  Confirmed patient has transportation. Gave directions, instructed to utilize valet parking.

## 2023-09-21 ENCOUNTER — Encounter: Admitting: Cardiology

## 2023-09-21 ENCOUNTER — Telehealth (HOSPITAL_COMMUNITY): Payer: Self-pay | Admitting: Licensed Clinical Social Worker

## 2023-09-21 NOTE — Telephone Encounter (Signed)
 H&V Care Navigation CSW Progress Note  Clinical Social Worker consulted to assist with transportation.  Ride arranged through [facility] Dual Complete:  Pick up from home: 1:45pm Pick up from appt: 4pm  Pt informed of above  SDOH Screenings   Food Insecurity: Food Insecurity Present (09/29/2022)  Housing: High Risk (01/25/2023)  Transportation Needs: Unmet Transportation Needs (09/21/2023)  Utilities: Not At Risk (09/18/2022)  Financial Resource Strain: High Risk (09/29/2022)  Tobacco Use: High Risk (07/15/2023)   Wyatt HILARIO Leech, LCSW Clinical Social Worker Advanced Heart Failure Clinic Desk#: [phone] Cell#: [phone]

## 2023-09-22 NOTE — Telephone Encounter (Signed)
 Entered in error

## 2023-09-23 ENCOUNTER — Telehealth: Payer: Self-pay | Admitting: Family

## 2023-09-23 NOTE — Telephone Encounter (Signed)
 Called to confirm/remind patient of their appointment at the Advanced Heart Failure Clinic on 09/24/23.   Appointment:   [x] Confirmed  [] Left mess   [] No answer/No voice mail  [] VM Full/unable to leave message  [] Phone not in service  Patient reminded to bring all medications and/or complete list.  Confirmed patient has transportation. Gave directions, instructed to utilize valet parking.

## 2023-09-24 ENCOUNTER — Ambulatory Visit: Attending: Cardiology | Admitting: Cardiology

## 2023-09-24 ENCOUNTER — Encounter: Payer: Self-pay | Admitting: Hematology and Oncology

## 2023-09-24 ENCOUNTER — Other Ambulatory Visit: Payer: Self-pay

## 2023-09-24 VITALS — BP 135/83 | HR 76 | Wt 140.0 lb

## 2023-09-24 DIAGNOSIS — K861 Other chronic pancreatitis: Secondary | ICD-10-CM | POA: Insufficient documentation

## 2023-09-24 DIAGNOSIS — I110 Hypertensive heart disease with heart failure: Secondary | ICD-10-CM | POA: Insufficient documentation

## 2023-09-24 DIAGNOSIS — F1090 Alcohol use, unspecified, uncomplicated: Secondary | ICD-10-CM | POA: Diagnosis not present

## 2023-09-24 DIAGNOSIS — E871 Hypo-osmolality and hyponatremia: Secondary | ICD-10-CM | POA: Diagnosis not present

## 2023-09-24 DIAGNOSIS — F17210 Nicotine dependence, cigarettes, uncomplicated: Secondary | ICD-10-CM | POA: Diagnosis not present

## 2023-09-24 DIAGNOSIS — I428 Other cardiomyopathies: Secondary | ICD-10-CM | POA: Insufficient documentation

## 2023-09-24 DIAGNOSIS — Z79899 Other long term (current) drug therapy: Secondary | ICD-10-CM | POA: Diagnosis not present

## 2023-09-24 DIAGNOSIS — Z716 Tobacco abuse counseling: Secondary | ICD-10-CM | POA: Diagnosis not present

## 2023-09-24 DIAGNOSIS — I5032 Chronic diastolic (congestive) heart failure: Secondary | ICD-10-CM | POA: Insufficient documentation

## 2023-09-24 DIAGNOSIS — Z7984 Long term (current) use of oral hypoglycemic drugs: Secondary | ICD-10-CM | POA: Insufficient documentation

## 2023-09-24 MED ORDER — ACETAMINOPHEN 500 MG PO TABS
500.0000 mg | ORAL_TABLET | Freq: Four times a day (QID) | ORAL | 5 refills | Status: AC | PRN
  Filled 2023-09-24: qty 30, 8d supply, fill #0

## 2023-09-24 NOTE — Progress Notes (Signed)
 Advanced Heart Failure Clinic Note     PCP: plans to get established w/ [facility] on [employer] Cardiologist: none  Chief Complaint: HF visit   HPI:  Mr Wyatt Montgomery is a 68 y/o male with a history of hyponatremia, polysubstance abuse, HTN, portal vein thrombosis, IDA, hypokalemia, alcohol use, chronic pancreatitis, pancreatitic cyst, chronic thrombocytosis and chronic HFpEF.    Admitted 09/17/22 due to chest pain, cough, shortness of breath and lower extremity swelling. He had nausea and vomiting the night prior to admission but he attributed this to drinking liquor. Diuresed. Treated with tolvaptan  due to hyponatremia. S/p IV iron  sucrose infusion on 09/18/2022 and 09/19/2022 due to anemia.   Was in the ED 01/28/23 due to chest pain/ shortness of breath for 2 days. ST depression with T wave inverted to the inferior and lateral leads.  No significant ST elevation. Repeat EKG obtained with no significant change from initial EKG. Chest x-ray no signs of pneumonia. No signs of pulmonary edema. Serial troponins are negative.   Seen in [facility] 04/25 and amlodipine  was stopped & farxiga  10mg  was started. .  Seen in [facility] 06/25 where valsartan  was increased to 160mg  daily and fluid restriction was reviewed.   Really having no issues today.  He reports doing well with his medical therapy, has continued to cut back on his drinking though is still doing this regularly.  Denies any recent lower extremity swelling, abdominal pain, abdominal swelling, hematemesis, lightheadedness, dizziness.  Cardiac Imaging Echo 09/18/22: EF 50-55% with mild MR  Current Outpatient Medications  Medication Sig Dispense Refill   acetaminophen  (TYLENOL ) 500 MG tablet Take 1 tablet (500 mg total) by mouth every 6 (six) hours as needed. 30 tablet 5   FARXIGA  10 MG TABS tablet Take 1 tablet (10 mg total) by mouth daily before breakfast. 30 tablet 11   ferrous sulfate  325 (65 FE) MG tablet Take 1 tablet (325 mg total) by  mouth daily. 30 tablet 3   furosemide  (LASIX ) 20 MG tablet Take 1 tablet (20 mg total) by mouth daily as needed (With a weight gain of 3 lbs or more overnight or 5 lbs in 1 week). 30 tablet 11   spironolactone  (ALDACTONE ) 25 MG tablet Take 1 tablet (25 mg total) by mouth daily. 30 tablet 11   valsartan  (DIOVAN ) 80 MG tablet Take 1 tablet (80 mg total) by mouth daily. (Patient taking differently: Take 160 mg by mouth daily.) 30 tablet 11   No current facility-administered medications for this visit.   Vitals:   09/24/23 1436  BP: 135/83  Pulse: 76  SpO2: 98%  Weight: 140 lb (63.5 kg)    Wt Readings from Last 3 Encounters:  09/24/23 140 lb (63.5 kg)  07/15/23 141 lb (64 kg)  06/23/23 142 lb (64.4 kg)   Lab Results  Component Value Date   CREATININE 1.25 07/15/2023   CREATININE 1.39 (H) 05/05/2023   CREATININE 1.16 01/28/2023    PHYSICAL EXAM:  GENERAL: NAD, well appearing PULM:  Normal work of breathing, CTAB CARDIAC:  JVP: flat         Normal rate with regular rhythm. No murmurs, rubs or gallops.  No edema. Warm and well perfused extremities. ABDOMEN: Soft, non-tender, non-distended. NEUROLOGIC: Patient is oriented x3 with no focal or lateralizing neurologic deficits.     EKG: not done   ASSESSMENT & PLAN:  1. NICM with preserved ejection fraction- - NYHA I - euvolemic today - Echo 09/18/22: EF 50-55% with mild  MR - Continue Farxiga  10 mg daily, spironolactone  25 mg daily, valsartan  160 mg daily  - No acute issues, no significant heart failure needs - Recent creatinine 2 months prior, 1.25 with normal potassium  2.  HTN- -pressure overall improved since valsartan  was increased - continue valsartan , spironolactone  - May need further increase at next visit  3.  Alcohol use- -working on cutting down - discussed complete cessation   4. Tobacco Abuse- - smoking 1-2 cigarettes daily - reviewed smoking cessation   Return in 6 months with APP  Wyatt Montgomery  07/15/23

## 2023-09-24 NOTE — Patient Instructions (Signed)
 Medication Changes:  We've sent you some Tylenol  500 MG as needed.   Follow-Up in: 6 MONTHS WITH ELLOUISE CLASS, FNP.   Thank you for choosing [facility].    At the Advanced Heart Failure Clinic, you and your health needs are our priority. We have a designated team specialized in the treatment of Heart Failure. This Care Team includes your primary Heart Failure Specialized Cardiologist (physician), Advanced Practice Providers (APPs- Physician Assistants and Nurse Practitioners), and Pharmacist who all work together to provide you with the care you need, when you need it.   You may see any of the following providers on your designated Care Team at your next follow up:  Dr. Toribio Fuel Dr. Ezra Shuck Dr. Ria Commander Dr. Morene Brownie ELLOUISE CLASS, FNP Jaun Bash, RPH-CPP  Please be sure to bring in all your medications bottles to every appointment.   Need to Contact Us :  If you have any questions or concerns before your next appointment please send us  a message through [place] or call our office at [phone].    TO LEAVE A MESSAGE FOR THE NURSE SELECT OPTION 2, PLEASE LEAVE A MESSAGE INCLUDING: YOUR NAME DATE OF BIRTH CALL BACK NUMBER REASON FOR CALL**this is important as we prioritize the call backs  YOU WILL RECEIVE A CALL BACK THE SAME DAY AS LONG AS YOU CALL BEFORE 4:00 PM

## 2023-09-29 ENCOUNTER — Other Ambulatory Visit (HOSPITAL_COMMUNITY): Payer: Self-pay

## 2023-10-15 ENCOUNTER — Other Ambulatory Visit: Payer: Self-pay | Admitting: Family Medicine

## 2023-10-15 DIAGNOSIS — F17200 Nicotine dependence, unspecified, uncomplicated: Secondary | ICD-10-CM

## 2023-10-15 DIAGNOSIS — Z136 Encounter for screening for cardiovascular disorders: Secondary | ICD-10-CM

## 2023-10-15 DIAGNOSIS — Z122 Encounter for screening for malignant neoplasm of respiratory organs: Secondary | ICD-10-CM

## 2023-10-18 ENCOUNTER — Encounter: Payer: Self-pay | Admitting: Hematology and Oncology

## 2023-10-18 ENCOUNTER — Other Ambulatory Visit: Payer: Self-pay

## 2023-11-04 ENCOUNTER — Ambulatory Visit: Admission: RE | Admit: 2023-11-04 | Source: Ambulatory Visit

## 2023-11-04 ENCOUNTER — Ambulatory Visit

## 2023-11-08 ENCOUNTER — Ambulatory Visit
Admission: RE | Admit: 2023-11-08 | Discharge: 2023-11-08 | Disposition: A | Discharge status: Home / Self Care | Source: Ambulatory Visit | Attending: Family Medicine | Admitting: Family Medicine

## 2023-11-08 DIAGNOSIS — J439 Emphysema, unspecified: Secondary | ICD-10-CM | POA: Insufficient documentation

## 2023-11-08 DIAGNOSIS — I10 Essential (primary) hypertension: Secondary | ICD-10-CM | POA: Diagnosis not present

## 2023-11-08 DIAGNOSIS — Z122 Encounter for screening for malignant neoplasm of respiratory organs: Secondary | ICD-10-CM | POA: Insufficient documentation

## 2023-11-08 DIAGNOSIS — F17210 Nicotine dependence, cigarettes, uncomplicated: Secondary | ICD-10-CM | POA: Insufficient documentation

## 2023-11-08 DIAGNOSIS — F17200 Nicotine dependence, unspecified, uncomplicated: Secondary | ICD-10-CM | POA: Diagnosis present

## 2023-11-08 DIAGNOSIS — K861 Other chronic pancreatitis: Secondary | ICD-10-CM | POA: Insufficient documentation

## 2023-11-08 DIAGNOSIS — N1339 Other hydronephrosis: Secondary | ICD-10-CM | POA: Insufficient documentation

## 2023-11-08 DIAGNOSIS — I700 Atherosclerosis of aorta: Secondary | ICD-10-CM | POA: Insufficient documentation

## 2023-11-08 DIAGNOSIS — I2510 Atherosclerotic heart disease of native coronary artery without angina pectoris: Secondary | ICD-10-CM | POA: Diagnosis not present

## 2023-11-08 DIAGNOSIS — Z136 Encounter for screening for cardiovascular disorders: Secondary | ICD-10-CM | POA: Diagnosis present

## 2023-11-08 DIAGNOSIS — K8689 Other specified diseases of pancreas: Secondary | ICD-10-CM | POA: Insufficient documentation

## 2023-11-17 ENCOUNTER — Other Ambulatory Visit (HOSPITAL_COMMUNITY): Payer: Self-pay

## 2023-12-02 ENCOUNTER — Encounter: Payer: Self-pay | Admitting: Family Medicine

## 2023-12-10 ENCOUNTER — Other Ambulatory Visit (HOSPITAL_COMMUNITY): Payer: Self-pay

## 2024-01-05 ENCOUNTER — Other Ambulatory Visit (HOSPITAL_COMMUNITY): Payer: Self-pay

## 2024-01-05 ENCOUNTER — Other Ambulatory Visit: Payer: Self-pay

## 2024-01-24 ENCOUNTER — Other Ambulatory Visit (HOSPITAL_COMMUNITY): Payer: Self-pay

## 2024-01-24 DIAGNOSIS — K644 Residual hemorrhoidal skin tags: Secondary | ICD-10-CM | POA: Insufficient documentation

## 2024-01-25 ENCOUNTER — Other Ambulatory Visit (HOSPITAL_COMMUNITY): Payer: Self-pay

## 2024-01-26 ENCOUNTER — Ambulatory Visit: Admitting: Family Medicine

## 2024-01-26 NOTE — Progress Notes (Unsigned)
 "   01/26/2024 [name] [id number] 11/02/1955  Gastroenterology Office Note    Referring Provider: Odell Chard, Edra GRADE, MD Primary Care Physician:  Odell Chard, Edra GRADE, MD  Primary GI Provider: Jinny Carmine, MD    Chief Complaint   No chief complaint on file.    History of Present Illness   Wyatt Montgomery is a 69 y.o. 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 30 tablet 5   FARXIGA  10 MG TABS tablet Take 1 tablet (10 mg total) by mouth daily before breakfast. 30 tablet 11   ferrous sulfate  325 (65 FE) MG tablet Take 1 tablet (325 mg total) by mouth daily. 30 tablet 3   furosemide  (LASIX ) 20 MG tablet Take 1 tablet (20 mg total) by mouth daily as needed (With a weight gain of 3 lbs or more overnight or 5 lbs in 1 week). 30 tablet 11   spironolactone  (ALDACTONE ) 25 MG tablet Take 1 tablet (25 mg total) by mouth daily. 30 tablet 11   valsartan  (DIOVAN ) 80 MG tablet Take 1 tablet (80 mg total) by mouth daily. (Patient taking differently: Take 160 mg by mouth daily.) 30 tablet 11   No current facility-administered medications for this visit.    Allergies as of 01/27/2024   (No Known Allergies)    Family History  Problem Relation Age of Onset    Hypertension Father     Social History   Socioeconomic History   Marital status: Married    Spouse name: Not on file   Number of children: 1   Years of education: Not on file   Highest education level: Not on file  Occupational History   Not on file  Tobacco Use   Smoking status: Heavy Smoker    Current packs/day: 0.50    Average packs/day: 0.5 packs/day for 20.0 years (10.0 ttl pk-yrs)    Types: Cigarettes   Smokeless tobacco: Never   Tobacco comments:    decreased, 1/2 pack per week.  Vaping Use   Vaping status: Never Used  Substance and Sexual Activity   Alcohol use: Yes    Alcohol/week: 28.0 standard drinks of alcohol    Types: 28 Cans of beer per week   Drug use: Yes    Frequency: 1.0 times per week    Types: Crack cocaine    Comment: used 4 days prior to admission   Sexual activity: Not Currently    Partners: Female  Other Topics Concern   Not on file  Social History Narrative   Not on file   Social Drivers of Health   Tobacco Use: High Risk (07/15/2023)   Patient History    Smoking Tobacco Use: Heavy Smoker  Smokeless Tobacco Use: Never    Passive Exposure: Not on file  Financial Resource Strain: High Risk (09/29/2022)   Overall Financial Resource Strain (CARDIA)    Difficulty of Paying Living Expenses: Very hard  Food Insecurity: Food Insecurity Present (09/29/2022)   Hunger Vital Sign    Worried About Running Out of Food in the Last Year: Often true    Ran Out of Food in the Last Year: Often true  Transportation Needs: Unmet Transportation Needs (09/21/2023)   Epic    Lack of Transportation (Medical): Yes    Lack of Transportation (Non-Medical): Yes  Physical Activity: Not on file  Stress: Not on file  Social Connections: Not on file  Intimate Partner Violence: Not At Risk (09/18/2022)   Humiliation, Afraid, Rape, and Kick questionnaire    Fear of Current or Ex-Partner: No    Emotionally Abused: No    Physically Abused: No    Sexually Abused:  No  Depression (PHQ2-9): Not on file  Alcohol Screen: Not on file  Housing: High Risk (01/25/2023)   Housing Stability Vital Sign    Unable to Pay for Housing in the Last Year: Yes    Number of Times Moved in the Last Year: 0    Homeless in the Last Year: No  Utilities: Not At Risk (09/18/2022)   AHC Utilities    Threatened with loss of utilities: No  Health Literacy: Not on file     RELEVANT GI HISTORY, IMAGING AND LABS: CBC    Component Value Date/Time   WBC 6.7 01/28/2023 1755   RBC 4.04 (L) 01/28/2023 1755   HGB 11.1 (L) 01/28/2023 1755   HCT 33.6 (L) 01/28/2023 1755   PLT 296 01/28/2023 1755   MCV 83.2 01/28/2023 1755   MCH 27.5 01/28/2023 1755   MCHC 33.0 01/28/2023 1755   RDW 14.2 01/28/2023 1755   LYMPHSABS 1.1 05/05/2022 1154   MONOABS 0.9 05/05/2022 1154   EOSABS 0.1 05/05/2022 1154   BASOSABS 0.1 05/05/2022 1154   Recent Labs    01/28/23 1755  HGB 11.1*    CMP     Component Value Date/Time   NA 134 07/15/2023 0952   K 4.4 07/15/2023 0952   CL 103 07/15/2023 0952   CO2 17 (L) 07/15/2023 0952   GLUCOSE 53 (L) 07/15/2023 0952   GLUCOSE 101 (H) 05/05/2023 1221   BUN 14 07/15/2023 0952   CREATININE 1.25 07/15/2023 0952   CALCIUM 9.3 07/15/2023 0952   PROT 6.6 09/20/2022 0446   ALBUMIN 2.8 (L) 09/20/2022 0446   AST 38 09/20/2022 0446   ALT 27 09/20/2022 0446   ALKPHOS 114 09/20/2022 0446   BILITOT 0.4 09/20/2022 0446   GFRNONAA 56 (L) 05/05/2023 1221   GFRAA >60 10/16/2019 1145      Latest Ref Rng & Units 09/20/2022    4:46 AM 09/17/2022    1:06 PM 05/05/2022   11:54 AM  Hepatic Function  Total Protein 6.5 - 8.1 g/dL 6.6  7.4  5.8   Albumin 3.5 - 5.0 g/dL 2.8  3.2  2.1   AST 15 - 41 U/L 38  105  23   ALT 0 - 44 U/L 27  44  9   Alk Phosphatase 38 - 126 U/L 114  153  140   Total Bilirubin 0.3 - 1.2 mg/dL 0.4  0.4  0.5   Bilirubin, Direct 0.0 - 0.2 mg/dL <9.8  <9.8        Review of Systems  All systems reviewed and negative except where noted  in HPI.    Physical Exam  There were no vitals taken for this visit. No LMP for male patient. General:   Alert and oriented. Pleasant and cooperative. Well-nourished and well-developed.  Head:  Normocephalic and atraumatic. Eyes:  Without icterus Ears:  Normal auditory acuity. Neck:  Supple; no masses or thyromegaly. Lungs:  Respirations even and unlabored.  Clear throughout to auscultation.   No wheezes, crackles, or rhonchi. No acute distress. Heart:  Regular rate and rhythm; no murmurs, clicks, rubs, or gallops. Abdomen:  Normal bowel sounds.  No bruits.  Soft, non-tender and non-distended without masses, hepatosplenomegaly or hernias noted.  No guarding or rebound tenderness.  ***Negative Carnett sign.   Rectal:  Deferred.***  Msk:  Symmetrical without gross deformities. Normal posture. Extremities:  Without edema. Neurologic:  Alert and  oriented x4;  grossly normal neurologically. Skin:  Intact without significant lesions or rashes. Psych:  Alert and cooperative. Normal mood and affect.   Assessment & Plan   Wyatt Montgomery is a 69 y.o. male presenting today with    I discussed the assessment and treatment plan with the patient. The patient was provided an opportunity to ask questions and all were answered. The patient agreed with the plan and demonstrated an understanding of the instructions.   The patient was advised to call back or seek an in-person evaluation if the symptoms worsen or if the condition fails to improve as anticipated.  Grayce Bohr, DNP, AGNP-C [facility]  "

## 2024-01-27 ENCOUNTER — Ambulatory Visit: Admitting: Family Medicine

## 2024-01-31 ENCOUNTER — Encounter: Payer: Self-pay | Admitting: Hematology and Oncology

## 2024-02-01 ENCOUNTER — Other Ambulatory Visit (HOSPITAL_COMMUNITY): Payer: Self-pay

## 2024-02-01 ENCOUNTER — Other Ambulatory Visit: Payer: Self-pay

## 2024-02-04 ENCOUNTER — Other Ambulatory Visit (HOSPITAL_COMMUNITY): Payer: Self-pay

## 2024-02-16 ENCOUNTER — Other Ambulatory Visit: Payer: Self-pay

## 2024-02-17 ENCOUNTER — Other Ambulatory Visit (HOSPITAL_COMMUNITY): Payer: Self-pay

## 2024-02-17 ENCOUNTER — Other Ambulatory Visit: Payer: Self-pay

## 2024-02-20 ENCOUNTER — Other Ambulatory Visit (HOSPITAL_COMMUNITY): Payer: Self-pay

## 2024-02-20 MED ORDER — DAPAGLIFLOZIN PROPANEDIOL 10 MG PO TABS
10.0000 mg | ORAL_TABLET | Freq: Every day | ORAL | 3 refills | Status: AC
  Filled 2024-02-20: qty 90, 90d supply, fill #0

## 2024-02-22 ENCOUNTER — Other Ambulatory Visit: Payer: Self-pay

## 2024-02-24 ENCOUNTER — Other Ambulatory Visit (HOSPITAL_COMMUNITY): Payer: Self-pay

## 2024-02-24 ENCOUNTER — Other Ambulatory Visit: Payer: Self-pay

## 2024-02-25 ENCOUNTER — Other Ambulatory Visit (HOSPITAL_COMMUNITY): Payer: Self-pay

## 2024-02-25 ENCOUNTER — Other Ambulatory Visit: Payer: Self-pay

## 2024-03-03 ENCOUNTER — Ambulatory Visit: Admitting: Family Medicine

## 2024-03-20 ENCOUNTER — Ambulatory Visit: Admitting: Urology

## 2024-03-22 ENCOUNTER — Encounter: Admitting: Family
